# Patient Record
Sex: Male | Born: 1945
Health system: Southern US, Community
[De-identification: ages and names within clinical notes are randomized; demographics above are authoritative.]

## PROBLEM LIST (undated history)

## (undated) DIAGNOSIS — Z860100 Personal history of colon polyps, unspecified: Secondary | ICD-10-CM

## (undated) DIAGNOSIS — L989 Disorder of the skin and subcutaneous tissue, unspecified: Secondary | ICD-10-CM

## (undated) DIAGNOSIS — R0602 Shortness of breath: Secondary | ICD-10-CM

## (undated) DIAGNOSIS — N529 Male erectile dysfunction, unspecified: Secondary | ICD-10-CM

## (undated) DIAGNOSIS — L570 Actinic keratosis: Secondary | ICD-10-CM

## (undated) DIAGNOSIS — M109 Gout, unspecified: Secondary | ICD-10-CM

## (undated) HISTORY — DX: Personal history of colonic polyps: Z86.010

## (undated) HISTORY — DX: Male erectile dysfunction, unspecified: N52.9

## (undated) HISTORY — DX: Shortness of breath: R06.02

## (undated) HISTORY — PX: COLONOSCOPY: SHX174

## (undated) HISTORY — DX: Actinic keratosis: L57.0

## (undated) HISTORY — DX: Personal history of colon polyps, unspecified: Z86.0100

## (undated) HISTORY — DX: Disorder of the skin and subcutaneous tissue, unspecified: L98.9

## (undated) HISTORY — DX: Gout, unspecified: M10.9

---

## 2005-02-10 ENCOUNTER — Ambulatory Visit: Payer: Self-pay | Admitting: Family Medicine

## 2007-11-14 ENCOUNTER — Ambulatory Visit: Payer: Self-pay | Admitting: Gastroenterology

## 2007-11-14 LAB — HM COLONOSCOPY

## 2007-12-27 ENCOUNTER — Ambulatory Visit: Payer: Self-pay | Admitting: Family Medicine

## 2009-10-31 ENCOUNTER — Emergency Department: Payer: Self-pay | Admitting: Internal Medicine

## 2010-06-01 ENCOUNTER — Emergency Department: Payer: Self-pay | Admitting: Emergency Medicine

## 2010-06-02 ENCOUNTER — Ambulatory Visit: Payer: Self-pay

## 2011-11-24 LAB — HM HEPATITIS C SCREENING LAB: HM Hepatitis Screen: NEGATIVE

## 2012-03-27 ENCOUNTER — Ambulatory Visit: Payer: Self-pay

## 2014-11-26 LAB — CBC AND DIFFERENTIAL
HCT: 42 % (ref 41–53)
Hemoglobin: 14.5 g/dL (ref 13.5–17.5)
Platelets: 192 10*3/uL (ref 150–399)
WBC: 6.4 10^3/mL

## 2014-11-26 LAB — LIPID PANEL
Cholesterol: 152 mg/dL (ref 0–200)
HDL: 53 mg/dL (ref 35–70)
LDL Cholesterol: 85 mg/dL
Triglycerides: 71 mg/dL (ref 40–160)

## 2014-11-26 LAB — HEPATIC FUNCTION PANEL
ALT: 14 U/L (ref 10–40)
AST: 20 U/L (ref 14–40)

## 2014-11-26 LAB — TSH: TSH: 1.64 u[IU]/mL (ref 0.41–5.90)

## 2014-11-26 LAB — BASIC METABOLIC PANEL
BUN: 12 mg/dL (ref 4–21)
CREATININE: 0.8 mg/dL (ref 0.6–1.3)
GLUCOSE: 93 mg/dL
POTASSIUM: 4.5 mmol/L (ref 3.4–5.3)
SODIUM: 141 mmol/L (ref 137–147)

## 2015-06-10 ENCOUNTER — Encounter: Payer: Self-pay | Admitting: Family Medicine

## 2015-06-10 ENCOUNTER — Ambulatory Visit (INDEPENDENT_AMBULATORY_CARE_PROVIDER_SITE_OTHER): Payer: Medicare Other | Admitting: Family Medicine

## 2015-06-10 VITALS — BP 134/84 | HR 83 | Temp 97.8°F | Resp 16 | Wt 182.4 lb

## 2015-06-10 DIAGNOSIS — T148 Other injury of unspecified body region: Secondary | ICD-10-CM

## 2015-06-10 DIAGNOSIS — W57XXXA Bitten or stung by nonvenomous insect and other nonvenomous arthropods, initial encounter: Secondary | ICD-10-CM | POA: Diagnosis not present

## 2015-06-10 DIAGNOSIS — N529 Male erectile dysfunction, unspecified: Secondary | ICD-10-CM | POA: Insufficient documentation

## 2015-06-10 DIAGNOSIS — R609 Edema, unspecified: Secondary | ICD-10-CM | POA: Insufficient documentation

## 2015-06-10 DIAGNOSIS — Z6825 Body mass index (BMI) 25.0-25.9, adult: Secondary | ICD-10-CM | POA: Insufficient documentation

## 2015-06-10 MED ORDER — PREDNISONE 20 MG PO TABS: ORAL_TABLET | ORAL | Status: DC

## 2015-06-10 MED ORDER — FLUOCINONIDE 0.05 % EX CREA: 1.0000 "application " | TOPICAL_CREAM | Freq: Three times a day (TID) | CUTANEOUS | Status: DC

## 2015-06-10 NOTE — Patient Instructions (Addendum)
Consider use of DEET insect repellents like OFF

## 2015-06-10 NOTE — Progress Notes (Signed)
Subjective:     Patient ID: Michael Hicks, male   DOB: 30-Dec-1945, 69 y.o.   MRN: [id number]  HPI  Chief Complaint  Patient presents with  . Rash    patient comes in office with concerns of rash that began 2-3 weeks ago in his lower extremity spreading to his  upper extremity   Spend time outside in his yard/fishing and has noticed one of his dogs has fleas. Treated him for similar bites last year.   Review of Systems  Constitutional: Negative for fever and chills.       Objective:   Physical Exam  Constitutional: He appears well-developed and well-nourished. No distress.  Skin:  Multiple papules c/w insect bites on his bilateral lower extremities.       Assessment:    1. Insect bites - fluocinonide cream (LIDEX) 0.05 %; Apply 1 application topically 3 (three) times daily. To poison ivy rash  Dispense: 15 g; Refill: 0 - predniSONE (DELTASONE) 20 MG tablet; Taper as follows: 3 pills for 4 days, two pills for 4 days, one pill for four days  Dispense: 24 tablet; Refill: 0    Plan:    Encourage use of insect repellent.

## 2015-06-28 ENCOUNTER — Other Ambulatory Visit: Payer: Self-pay | Admitting: Family Medicine

## 2015-06-28 DIAGNOSIS — W57XXXA Bitten or stung by nonvenomous insect and other nonvenomous arthropods, initial encounter: Secondary | ICD-10-CM

## 2015-06-28 MED ORDER — FLUOCINONIDE 0.05 % EX CREA: 1.0000 "application " | TOPICAL_CREAM | Freq: Three times a day (TID) | CUTANEOUS | Status: DC

## 2015-06-28 NOTE — Telephone Encounter (Signed)
Refill request

## 2015-06-28 NOTE — Telephone Encounter (Signed)
Patient advised.

## 2015-06-28 NOTE — Telephone Encounter (Signed)
Pt called wanting to get a refill on the Fluocinonide Cream (lidex) 0.05% apply 3 times a day. [place]. CB# [phone]. CC

## 2015-06-28 NOTE — Telephone Encounter (Signed)
Refill sent in

## 2015-12-01 ENCOUNTER — Encounter: Payer: Self-pay | Admitting: Family Medicine

## 2015-12-01 ENCOUNTER — Ambulatory Visit (INDEPENDENT_AMBULATORY_CARE_PROVIDER_SITE_OTHER): Payer: Medicare Other | Admitting: Family Medicine

## 2015-12-01 VITALS — BP 138/82 | HR 84 | Temp 98.3°F | Resp 16 | Ht 71.0 in | Wt 181.0 lb

## 2015-12-01 DIAGNOSIS — Z1211 Encounter for screening for malignant neoplasm of colon: Secondary | ICD-10-CM

## 2015-12-01 DIAGNOSIS — R239 Unspecified skin changes: Secondary | ICD-10-CM

## 2015-12-01 DIAGNOSIS — Z0000 Encounter for general adult medical examination without abnormal findings: Secondary | ICD-10-CM | POA: Diagnosis not present

## 2015-12-01 DIAGNOSIS — R234 Changes in skin texture: Secondary | ICD-10-CM | POA: Diagnosis not present

## 2015-12-01 DIAGNOSIS — Z125 Encounter for screening for malignant neoplasm of prostate: Secondary | ICD-10-CM | POA: Diagnosis not present

## 2015-12-01 DIAGNOSIS — Z23 Encounter for immunization: Secondary | ICD-10-CM | POA: Diagnosis not present

## 2015-12-01 LAB — IFOBT (OCCULT BLOOD): IFOBT: POSITIVE

## 2015-12-01 NOTE — Progress Notes (Signed)
Patient: Michael Hicks, Male    DOB: [birth year], 69 y.o.   MRN: [id number] Visit Date: 12/01/2015  Today's Provider: Margarita Rana, MD   Chief Complaint  Patient presents with  . Medicare Wellness   Subjective:    Annual wellness visit Michael Hicks is a 69 y.o. male. He feels well. He reports exercising daily, walks 1 mile and lifts weights. He reports he is sleeping well.  Last AWV- 11/25/2014 (labs stable; lipids WNL) Last Colon- 11/14/2007- polyps. Dr. Allen Norris Last PSA- 05/09/2013- 1.1 Immunizations UTD, except flu  -----------------------------------------------------------   Review of Systems  Eyes: Positive for itching.  All other systems reviewed and are negative.   Social History   Social History  . Marital Status: Divorced    Spouse Name: N/A  . Number of Children: 1  . Years of Education: 14   Occupational History  .      RETIRED   Social History Main Topics  . Smoking status: Former Smoker    Quit date: 12/04/1990  . Smokeless tobacco: Never Used  . Alcohol Use: 6.0 oz/week    0 Standard drinks or equivalent, 10 Cans of beer per week     Comment: 1-2 beers daily  . Drug Use: No  . Sexual Activity: Not on file   Other Topics Concern  . Not on file   Social History Narrative    Past Medical History  Diagnosis Date  . Erectile dysfunction      Patient Active Problem List   Diagnosis Date Noted  . Body mass index (BMI) of 25.0-25.9 in adult 06/10/2015  . Accumulation of fluid in tissues 06/10/2015  . ED (erectile dysfunction) of organic origin 06/10/2015    No past surgical history on file.  His family history includes COPD in his mother; Heart disease in his mother; Hypertension in his father.    Previous Medications   No medications on file    Patient Care Team: Margarita Rana, MD as PCP - General (Family Medicine)     Objective:   Vitals: BP 138/82 mmHg  Pulse 84  Temp(Src) 98.3 F (36.8 C) (Oral)  Resp 16   Ht 5\' 11"  (1.803 m)  Wt 181 lb (82.101 kg)  BMI 25.26 kg/m2  Physical Exam  Constitutional: He is oriented to person, place, and time. He appears well-developed and well-nourished.  HENT:  Head: Normocephalic and atraumatic.  Right Ear: External ear normal.  Left Ear: External ear normal.  Nose: Nose normal.  Mouth/Throat: Oropharynx is clear and moist.  Eyes: Conjunctivae and EOM are normal. Pupils are equal, round, and reactive to light.  Neck: Normal range of motion. Neck supple.  Cardiovascular: Normal rate, regular rhythm and normal heart sounds.   Pulmonary/Chest: Effort normal and breath sounds normal. No respiratory distress.  Abdominal: Soft. Bowel sounds are normal.  Genitourinary: Guaiac positive stool.  Prostate slightly enlarged.   Musculoskeletal: Normal range of motion.  Neurological: He is alert and oriented to person, place, and time.  Skin:  Multiple dry, scaly lesions in sun exposed areas.   Psychiatric: He has a normal mood and affect. His behavior is normal. Judgment and thought content normal.    Activities of Daily Living In your present state of health, do you have any difficulty performing the following activities: 12/01/2015  Hearing? N  Vision? N  Difficulty concentrating or making decisions? N  Walking or climbing stairs? N  Dressing or bathing? N  Doing errands, shopping?  N    Fall Risk Assessment Fall Risk  12/01/2015  Falls in the past year? No     Depression Screen PHQ 2/9 Scores 12/01/2015  PHQ - 2 Score 0    Cognitive Testing - 6-CIT  Correct? Score   What year is it? yes 0 0 or 4  What month is it? yes 0 0 or 3  Memorize:    Michael Hicks,  44,  [place] [address],  [place],      What time is it? (within 1 hour) yes 0 0 or 3  Count backwards from 20 yes 0 0, 2, or 4  Name the months of the year yes 0 0, 2, or 4  Repeat name & address above yes 0 0, 2, 4, 6, 8, or 10       TOTAL SCORE  0/28   Interpretation:  Normal  Normal (0-7)  Abnormal (8-28)       Assessment & Plan:     Annual Wellness Visit  Reviewed patient's Family Medical History Reviewed and updated list of patient's medical providers Assessment of cognitive impairment was done Assessed patient's functional ability Established a written schedule for health screening [place] Completed and Reviewed  Exercise Activities and Dietary recommendations Goals    None      Immunization History  Administered Date(s) Administered  . Pneumococcal Conjugate-13 11/24/2013  . Pneumococcal Polysaccharide-23 11/25/2014  . Td 11/22/2012  . Tdap 11/22/2012  . Zoster 11/22/2012    Health Maintenance  Topic Date Due  . Hepatitis C Screening  16-Aug-1946  . INFLUENZA VACCINE  07/05/2015  . COLONOSCOPY  11/13/2017  . TETANUS/TDAP  11/22/2022  . ZOSTAVAX  Completed  . PNA vac Low Risk Adult  Completed   1. Medicare annual wellness visit, subsequent As above.   2. Flu vaccine need Given today.  - Flu Vaccine QUAD 36+ mos IM  3. Recent skin changes Refer to Dermatology.  - Ambulatory referral to Dermatology  4. Screening PSA (prostate specific antigen) Would like it rechecked.  - PSA  5. Colon cancer screening Positive today. Recheck in one week. Did have colon polyp and thinks he was supposed to follow up before 10 years. Will check into this also.   - IFOBT POC (occult bld, rslt in office)    Discussed health benefits of physical activity, and encouraged him to engage in regular exercise appropriate for his age and condition.  I have reviewed the document for accuracy and completeness and I agree with above. Jerrell Belfast, MD   Margarita Rana, MD    ------------------------------------------------------------------------------------------------------------

## 2015-12-02 LAB — PSA: Prostate Specific Ag, Serum: 1.8 ng/mL (ref 0.0–4.0)

## 2015-12-08 ENCOUNTER — Telehealth: Payer: Self-pay | Admitting: Emergency Medicine

## 2015-12-08 ENCOUNTER — Other Ambulatory Visit (INDEPENDENT_AMBULATORY_CARE_PROVIDER_SITE_OTHER): Payer: Medicare Other | Admitting: Emergency Medicine

## 2015-12-08 DIAGNOSIS — Z1211 Encounter for screening for malignant neoplasm of colon: Secondary | ICD-10-CM | POA: Diagnosis not present

## 2015-12-08 DIAGNOSIS — R195 Other fecal abnormalities: Secondary | ICD-10-CM

## 2015-12-08 LAB — IFOBT (OCCULT BLOOD): IFOBT: POSITIVE

## 2015-12-08 NOTE — Telephone Encounter (Signed)
Pt brought his OC lyte specimen by and the results were positive, I put it in the chart but since it was positive I wanted to make sure you saw the results. Thanks.

## 2015-12-08 NOTE — Telephone Encounter (Signed)
Referred to Dr. Allen Norris as urgent referral. LMTCB to inform Mr. Dacy. Renaldo Fiddler, CMA

## 2015-12-08 NOTE — Telephone Encounter (Signed)
Informed pt as below. Michael Hicks, CMA  

## 2015-12-08 NOTE — Telephone Encounter (Signed)
Please check chart and schedule referral STAT to GI that he has seen in past.  Thanks.

## 2015-12-10 ENCOUNTER — Telehealth: Payer: Self-pay | Admitting: Gastroenterology

## 2015-12-10 ENCOUNTER — Telehealth: Payer: Self-pay | Admitting: Family Medicine

## 2015-12-10 NOTE — Telephone Encounter (Signed)
Advised pt of lab results. Pt verbally acknowledges understanding. [name], CMA   

## 2015-12-10 NOTE — Telephone Encounter (Addendum)
Gastroenterology Pre-Procedure Review  Request Date: 12-30-2015 Requesting Physician: Dr.  PATIENT REVIEW QUESTIONS: The patient responded to the following health history questions as indicated:    1. Are you having any GI issues? no  Per patient a test came back with very little blood found, no problems and hasn't seen any 2. Do you have a personal history of Polyps? yes ( ) 3. Do you have a family history of Colon Cancer or Polyps? no 4. Diabetes Mellitus? no 5. Joint replacements in the past 12 months?no 6. Major health problems in the past 3 months?no 7. Any artificial heart valves, MVP, or defibrillator?no    MEDICATIONS & ALLERGIES:    Patient reports the following regarding taking any anticoagulation/antiplatelet therapy:   Plavix, Coumadin, Eliquis, Xarelto, Lovenox, Pradaxa, Brilinta, or Effient? no Aspirin? no  Patient confirms/reports the following medications:  No current outpatient prescriptions on file.   No current facility-administered medications for this visit.    Patient confirms/reports the following allergies:  No Known Allergies  No orders of the defined types were placed in this encounter.    AUTHORIZATION INFORMATION Primary Insurance: 1D#: Group #:  Secondary Insurance: 1D#: Group #:  SCHEDULE INFORMATION: Date:12-30-15  Time: Location:[facility]

## 2015-12-10 NOTE — Telephone Encounter (Signed)
Ok to call. PSA normal. Thanks.

## 2015-12-10 NOTE — Telephone Encounter (Signed)
Pt calling to request lab results.  CB#[phone]/MW

## 2015-12-14 ENCOUNTER — Other Ambulatory Visit: Payer: Self-pay

## 2015-12-14 NOTE — Telephone Encounter (Signed)
Patient left voice message for you that he is ready to set up colonoscopy. [phone]

## 2015-12-29 DIAGNOSIS — L570 Actinic keratosis: Secondary | ICD-10-CM | POA: Diagnosis not present

## 2015-12-29 DIAGNOSIS — Z85828 Personal history of other malignant neoplasm of skin: Secondary | ICD-10-CM | POA: Diagnosis not present

## 2015-12-29 DIAGNOSIS — L821 Other seborrheic keratosis: Secondary | ICD-10-CM | POA: Diagnosis not present

## 2015-12-29 DIAGNOSIS — D485 Neoplasm of uncertain behavior of skin: Secondary | ICD-10-CM | POA: Diagnosis not present

## 2015-12-29 DIAGNOSIS — L814 Other melanin hyperpigmentation: Secondary | ICD-10-CM | POA: Diagnosis not present

## 2016-01-07 ENCOUNTER — Encounter: Payer: Self-pay | Admitting: *Deleted

## 2016-01-11 NOTE — Discharge Instructions (Signed)

## 2016-01-14 ENCOUNTER — Other Ambulatory Visit: Payer: Self-pay | Admitting: Gastroenterology

## 2016-01-14 ENCOUNTER — Ambulatory Visit: Payer: Medicare Other | Admitting: Anesthesiology

## 2016-01-14 ENCOUNTER — Ambulatory Visit
Admission: RE | Admit: 2016-01-14 | Discharge: 2016-01-14 | Disposition: A | Discharge status: Home / Self Care | Payer: Medicare Other | Source: Ambulatory Visit | Attending: Gastroenterology | Admitting: Gastroenterology

## 2016-01-14 ENCOUNTER — Encounter
Admission: RE | Disposition: A | Discharge status: Home / Self Care | Payer: Self-pay | Source: Ambulatory Visit | Attending: Gastroenterology

## 2016-01-14 DIAGNOSIS — D125 Benign neoplasm of sigmoid colon: Secondary | ICD-10-CM | POA: Insufficient documentation

## 2016-01-14 DIAGNOSIS — Z825 Family history of asthma and other chronic lower respiratory diseases: Secondary | ICD-10-CM | POA: Diagnosis not present

## 2016-01-14 DIAGNOSIS — D122 Benign neoplasm of ascending colon: Secondary | ICD-10-CM | POA: Insufficient documentation

## 2016-01-14 DIAGNOSIS — Z860100 Personal history of colon polyps, unspecified: Secondary | ICD-10-CM | POA: Insufficient documentation

## 2016-01-14 DIAGNOSIS — D123 Benign neoplasm of transverse colon: Secondary | ICD-10-CM | POA: Diagnosis not present

## 2016-01-14 DIAGNOSIS — Z87891 Personal history of nicotine dependence: Secondary | ICD-10-CM | POA: Insufficient documentation

## 2016-01-14 DIAGNOSIS — Z1211 Encounter for screening for malignant neoplasm of colon: Secondary | ICD-10-CM | POA: Insufficient documentation

## 2016-01-14 DIAGNOSIS — Z8249 Family history of ischemic heart disease and other diseases of the circulatory system: Secondary | ICD-10-CM | POA: Diagnosis not present

## 2016-01-14 DIAGNOSIS — D124 Benign neoplasm of descending colon: Secondary | ICD-10-CM | POA: Insufficient documentation

## 2016-01-14 HISTORY — PX: POLYPECTOMY: SHX5525

## 2016-01-14 HISTORY — PX: COLONOSCOPY WITH PROPOFOL: SHX5780

## 2016-01-14 SURGERY — COLONOSCOPY WITH PROPOFOL
Anesthesia: Monitor Anesthesia Care

## 2016-01-14 MED ORDER — STERILE WATER FOR IRRIGATION IR SOLN
Status: DC | PRN
  Administered 2016-01-14: 09:00:00

## 2016-01-14 MED ORDER — PROPOFOL 10 MG/ML IV BOLUS
INTRAVENOUS | Status: DC | PRN
Start: 2016-01-14 — End: 2016-01-14
  Administered 2016-01-14 (×2): 50 mg via INTRAVENOUS
  Administered 2016-01-14: 100 mg via INTRAVENOUS
  Administered 2016-01-14: 50 mg via INTRAVENOUS
  Administered 2016-01-14: 30 mg via INTRAVENOUS
  Administered 2016-01-14 (×2): 50 mg via INTRAVENOUS
  Administered 2016-01-14: 20 mg via INTRAVENOUS

## 2016-01-14 MED ORDER — LACTATED RINGERS IV SOLN
INTRAVENOUS | Status: DC
  Administered 2016-01-14: 08:00:00 via INTRAVENOUS

## 2016-01-14 MED ORDER — LIDOCAINE HCL (CARDIAC) 20 MG/ML IV SOLN
INTRAVENOUS | Status: DC | PRN
  Administered 2016-01-14: 50 mg via INTRAVENOUS

## 2016-01-14 SURGICAL SUPPLY — 28 items
CANISTER SUCT 1200ML W/VALVE (MISCELLANEOUS) ×3 IMPLANT
FCP ESCP3.2XJMB 240X2.8X (MISCELLANEOUS)
FORCEPS BIOP RAD 4 LRG CAP 4 (CUTTING FORCEPS) IMPLANT
FORCEPS BIOP RJ4 240 W/NDL (MISCELLANEOUS)
FORCEPS ESCP3.2XJMB 240X2.8X (MISCELLANEOUS) IMPLANT
GOWN CVR UNV OPN BCK APRN NK (MISCELLANEOUS) ×4 IMPLANT
GOWN ISOL THUMB LOOP REG UNIV (MISCELLANEOUS) ×2
HEMOCLIP INSTINCT (CLIP) IMPLANT
INJECTOR VARIJECT VIN23 (MISCELLANEOUS) IMPLANT
KIT CO2 TUBING (TUBING) IMPLANT
KIT DEFENDO VALVE AND CONN (KITS) IMPLANT
KIT ENDO PROCEDURE OLY (KITS) ×3 IMPLANT
LIGATOR MULTIBAND 6SHOOTER MBL (MISCELLANEOUS) IMPLANT
MARKER SPOT ENDO TATTOO 5ML (MISCELLANEOUS) IMPLANT
PAD GROUND ADULT SPLIT (MISCELLANEOUS) IMPLANT
SNARE SHORT THROW 13M SML OVAL (MISCELLANEOUS) IMPLANT
SNARE SHORT THROW 30M LRG OVAL (MISCELLANEOUS) ×3 IMPLANT
SPOT EX ENDOSCOPIC TATTOO (MISCELLANEOUS)
SUCTION POLY TRAP 4CHAMBER (MISCELLANEOUS) IMPLANT
TRAP SUCTION POLY (MISCELLANEOUS) ×9 IMPLANT
TUBING CONN 6MMX3.1M (TUBING)
TUBING SUCTION CONN 0.25 STRL (TUBING) IMPLANT
UNDERPAD 30X60 958B10 (PK) (MISCELLANEOUS) IMPLANT
VALVE BIOPSY ENDO (VALVE) IMPLANT
VARIJECT INJECTOR VIN23 (MISCELLANEOUS)
WATER AUXILLARY (MISCELLANEOUS) IMPLANT
WATER STERILE IRR 250ML POUR (IV SOLUTION) ×3 IMPLANT
WATER STERILE IRR 500ML POUR (IV SOLUTION) IMPLANT

## 2016-01-14 NOTE — Anesthesia Postprocedure Evaluation (Signed)
Anesthesia Post Note  Patient: Michael Hicks  Procedure(s) Performed: Procedure(s) (LRB): COLONOSCOPY WITH PROPOFOL (N/A) POLYPECTOMY  Patient location during evaluation: PACU Anesthesia Type: MAC Level of consciousness: awake and alert and oriented Pain management: pain level controlled Vital Signs Assessment: post-procedure vital signs reviewed and stable Respiratory status: spontaneous breathing and nonlabored ventilation Cardiovascular status: stable Postop Assessment: no signs of nausea or vomiting and adequate PO intake Anesthetic complications: no    Estill Batten

## 2016-01-14 NOTE — Anesthesia Procedure Notes (Signed)
Procedure Name: MAC Performed by: Adreena Willits Pre-anesthesia Checklist: Patient identified, Emergency Drugs available, Suction available, Timeout performed and Patient being monitored Patient Re-evaluated:Patient Re-evaluated prior to inductionOxygen Delivery Method: Nasal cannula Placement Confirmation: positive ETCO2     

## 2016-01-14 NOTE — Anesthesia Preprocedure Evaluation (Signed)
Anesthesia Evaluation  Patient identified by MRN, date of birth, ID band  Reviewed: Allergy & Precautions, NPO status , Patient's Chart, lab work & pertinent test results, reviewed documented beta blocker date and time   Airway Mallampati: I  TM Distance: >3 FB Neck ROM: Full    Dental no notable dental hx.    Pulmonary former smoker,    Pulmonary exam normal        Cardiovascular negative cardio ROS Normal cardiovascular exam     Neuro/Psych negative neurological ROS  negative psych ROS   GI/Hepatic negative GI ROS, Neg liver ROS,   Endo/Other  negative endocrine ROS  Renal/GU negative Renal ROS     Musculoskeletal negative musculoskeletal ROS (+)   Abdominal   Peds negative pediatric ROS (+)  Hematology negative hematology ROS (+)   Anesthesia Other Findings   Reproductive/Obstetrics                             Anesthesia Physical Anesthesia Plan  ASA: II  Anesthesia Plan: MAC   Post-op Pain Management:    Induction: Intravenous  Airway Management Planned:   Additional Equipment:   Intra-op Plan:   Post-operative Plan:   Informed Consent: I have reviewed the patients History and Physical, chart, labs and discussed the procedure including the risks, benefits and alternatives for the proposed anesthesia with the patient or authorized representative who has indicated his/her understanding and acceptance.     Plan Discussed with: CRNA  Anesthesia Plan Comments:         Anesthesia Quick Evaluation

## 2016-01-14 NOTE — Op Note (Signed)
[facility]astroenterology Patient Name: Michael Hicks Procedure Date: 01/14/2016 8:47 AM MRN: [id number] Account #: [id number] Date of Birth: [birth year] Admit Type: Outpatient Age: 70 Room: [facility] OR ROOM 01 Gender: Male Note Status: Finalized Procedure:         Colonoscopy Indications:       High risk colon cancer surveillance: Personal history of                     colonic polyps Providers:         Lucilla Lame, MD Referring MD:      Jerrell Belfast, MD (Referring MD) Medicines:         Propofol per Anesthesia Complications:     No immediate complications. Procedure:         Pre-Anesthesia Assessment:                    - Prior to the procedure, a History and Physical was                     performed, and patient medications and allergies were                     reviewed. The patient's tolerance of previous anesthesia                     was also reviewed. The risks and benefits of the procedure                     and the sedation options and risks were discussed with the                     patient. All questions were answered, and informed consent                     was obtained. Prior Anticoagulants: The patient has taken                     no previous anticoagulant or antiplatelet agents. ASA                     Grade Assessment: II - A patient with mild systemic                     disease. After reviewing the risks and benefits, the                     patient was deemed in satisfactory condition to undergo                     the procedure.                    After obtaining informed consent, the colonoscope was                     passed under direct vision. Throughout the procedure, the                     patient's blood pressure, pulse, and oxygen saturations                     were monitored continuously. The Crawford  colonoscope (S#: [id number]) was introduced through the anus                     and advanced to  the the cecum, identified by appendiceal                     orifice and ileocecal valve. The colonoscopy was performed                     without difficulty. The patient tolerated the procedure                     well. The quality of the bowel preparation was excellent. Findings:      The perianal and digital rectal examinations were normal.      Multiple sessile polyps were found in the sigmoid colon, in the       descending colon, in the transverse colon, at the hepatic flexure and in       the ascending colon. The polyps were 5 to 9 mm in size. These polyps       were removed with a cold snare. Resection and retrieval were complete.      Many sessile polyps were found in the entire colon. The polyps were 3 to       5 mm in size. These polyps were removed with a cold biopsy forceps.       Resection and retrieval were complete. Impression:        - Multiple 5 to 9 mm polyps in the sigmoid colon, in the                     descending colon, in the transverse colon, at the hepatic                     flexure and in the ascending colon. Resected and retrieved.                    - Many 3 to 5 mm polyps in the entire colon. Resected and                     retrieved. Recommendation:    - Await pathology results.                    - Repeat colonoscopy in 1 year for surveillance. Procedure Code(s): --- Professional ---                    [phone], Colonoscopy, flexible; with removal of tumor(s),                     polyp(s), or other lesion(s) by snare technique                    45380, 20, Colonoscopy, flexible; with biopsy, single or                     multiple Diagnosis Code(s): --- Professional ---                    Z86.010, Personal history of colonic polyps                    D12.5, Benign neoplasm of sigmoid colon  D12.4, Benign neoplasm of descending colon                    D12.3, Benign neoplasm of transverse colon                    D12.2, Benign neoplasm of  ascending colon CPT copyright 2014 American Medical Association. All rights reserved. The codes documented in this report are preliminary and upon coder review may  be revised to meet current compliance requirements. Lucilla Lame, MD 01/14/2016 9:19:00 AM This report has been signed electronically. Number of Addenda: 0 Note Initiated On: 01/14/2016 8:47 AM Scope Withdrawal Time: 0 hours 12 minutes 47 seconds  Total Procedure Duration: 0 hours 15 minutes 46 seconds       [facility]

## 2016-01-14 NOTE — H&P (Signed)
  [facility]  [address] [place], [place] [address] Phone: [phone] Fax : [phone]  Primary Care Physician:  Margarita Rana, MD Primary Gastroenterologist:  Dr. Allen Norris  Pre-Procedure History & Physical: HPI:  Michael Hicks is a 70 y.o. male is here for an colonoscopy.   Past Medical History  Diagnosis Date  . Erectile dysfunction     Past Surgical History  Procedure Laterality Date  . Colonoscopy      Prior to Admission medications   Not on File    Allergies as of 12/14/2015  . (No Known Allergies)    Family History  Problem Relation Age of Onset  . COPD Mother   . Heart disease Mother   . Hypertension Father     Social History   Social History  . Marital Status: Divorced    Spouse Name: N/A  . Number of Children: 1  . Years of Education: 14   Occupational History  .      RETIRED   Social History Main Topics  . Smoking status: Former Smoker    Quit date: 12/04/1990  . Smokeless tobacco: Never Used  . Alcohol Use: 6.0 oz/week    10 Cans of beer, 0 Standard drinks or equivalent per week     Comment: 1-2 beers daily  . Drug Use: No  . Sexual Activity: Not on file   Other Topics Concern  . Not on file   Social History Narrative    Review of Systems: See HPI, otherwise negative ROS  Physical Exam: BP 156/83 mmHg  Pulse 84  Temp(Src) 98.1 F (36.7 C) (Temporal)  Resp 16  Ht 5\' 11"  (1.803 m)  Wt 172 lb (78.019 kg)  BMI 24.00 kg/m2  SpO2 100% General:   Alert,  pleasant and cooperative in NAD Head:  Normocephalic and atraumatic. Neck:  Supple; no masses or thyromegaly. Lungs:  Clear throughout to auscultation.    Heart:  Regular rate and rhythm. Abdomen:  Soft, nontender and nondistended. Normal bowel sounds, without guarding, and without rebound.   Neurologic:  Alert and  oriented x4;  grossly normal neurologically.  Impression/Plan: [name] is here for an colonoscopy to be performed for history  of colon polyps.  Risks, benefits, limitations, and alternatives regarding  colonoscopy have been reviewed with the patient.  Questions have been answered.  All parties agreeable.   Ollen Bowl, MD  01/14/2016, 8:21 AM

## 2016-01-14 NOTE — Transfer of Care (Signed)
Immediate Anesthesia Transfer of Care Note  Patient: Michael Hicks  Procedure(s) Performed: Procedure(s): COLONOSCOPY WITH PROPOFOL (N/A) POLYPECTOMY  Patient Location: PACU  Anesthesia Type: MAC  Level of Consciousness: awake, alert  and patient cooperative  Airway and Oxygen Therapy: Patient Spontanous Breathing and Patient connected to supplemental oxygen  Post-op Assessment: Post-op Vital signs reviewed, Patient's Cardiovascular Status Stable, Respiratory Function Stable, Patent Airway and No signs of Nausea or vomiting  Post-op Vital Signs: Reviewed and stable  Complications: No apparent anesthesia complications

## 2016-01-17 ENCOUNTER — Encounter: Payer: Self-pay | Admitting: Gastroenterology

## 2016-01-20 ENCOUNTER — Encounter: Payer: Self-pay | Admitting: Gastroenterology

## 2016-01-26 ENCOUNTER — Telehealth: Payer: Self-pay | Admitting: Gastroenterology

## 2016-01-26 NOTE — Telephone Encounter (Signed)
Patient called and has some questions regarding the letter you sent him about his colonoscopy. Is it cancerous?

## 2016-01-26 NOTE — Telephone Encounter (Signed)
Answered pt's questions about his colonoscopy results. Pt has been added to recall list for 1 year due to results and amount of polyps removed.

## 2016-06-15 ENCOUNTER — Telehealth: Payer: Self-pay | Admitting: Family Medicine

## 2016-06-15 DIAGNOSIS — N529 Male erectile dysfunction, unspecified: Secondary | ICD-10-CM

## 2016-06-15 MED ORDER — SILDENAFIL CITRATE 100 MG PO TABS: 100.0000 mg | ORAL_TABLET | Freq: Every day | ORAL | Status: DC | PRN

## 2016-06-15 NOTE — Telephone Encounter (Signed)
Pt wants refill of Viagra sent to [facility] on [address].  He is wanting a year supply like Dr. Venia Minks did prior to her leaving.

## 2016-06-15 NOTE — Telephone Encounter (Signed)
Rx sent 

## 2016-08-10 ENCOUNTER — Telehealth: Payer: Self-pay | Admitting: Physician Assistant

## 2016-08-10 DIAGNOSIS — N529 Male erectile dysfunction, unspecified: Secondary | ICD-10-CM

## 2016-08-10 MED ORDER — SILDENAFIL CITRATE 20 MG PO TABS: ORAL_TABLET | ORAL | 4 refills | Status: DC

## 2016-08-10 NOTE — Telephone Encounter (Signed)
New Rx sent to [facility]

## 2016-08-10 NOTE — Telephone Encounter (Signed)
Patient advised.

## 2016-08-10 NOTE — Telephone Encounter (Signed)
Pt stated that his insurance doesn't cover the Viagra 100 mg and he can't afford the out of pocket cost. Pt would like a RX for sildenafil 20 mg sent to [facility] in [place]. Pt also, advised that he was taking 50 mg of Viagra and it worked well and was asking if he could take 2 and 1/2 of the 20 mg Sildenafil to equal 50 mg. Getting the medication at [facility] will cost 80$ for 50 pills. Pt request a call back to advise if this is ok. Please advise. Thanks TNP

## 2016-08-31 ENCOUNTER — Encounter: Payer: Self-pay | Admitting: Family Medicine

## 2016-08-31 ENCOUNTER — Ambulatory Visit (INDEPENDENT_AMBULATORY_CARE_PROVIDER_SITE_OTHER): Payer: Medicare Other | Admitting: Family Medicine

## 2016-08-31 VITALS — BP 116/68 | HR 71 | Temp 98.2°F | Resp 17 | Wt 174.6 lb

## 2016-08-31 DIAGNOSIS — W57XXXA Bitten or stung by nonvenomous insect and other nonvenomous arthropods, initial encounter: Secondary | ICD-10-CM

## 2016-08-31 DIAGNOSIS — J069 Acute upper respiratory infection, unspecified: Secondary | ICD-10-CM

## 2016-08-31 DIAGNOSIS — T148 Other injury of unspecified body region: Secondary | ICD-10-CM | POA: Diagnosis not present

## 2016-08-31 NOTE — Patient Instructions (Signed)
Discussed use of Mucinex D for congestion, Delsym for cough, and Benadryl for postnasal drainage. Resume steroid cream for your insect bites.

## 2016-08-31 NOTE — Progress Notes (Signed)
Subjective:     Patient ID: Michael Hicks, male   DOB: September 29, 1946, 70 y.o.   MRN: [id number]  HPI  Chief Complaint  Patient presents with  . URI    Patient comes in office today with concerns of cold like symptoms intermittent over the past 4 weeks. Patient states that he has head/chest congestion, sore throat, chills, and barky cough.   . Rash    Patient would like to discuss medication for poison ivy, he states that it has not cleared up since last office visit.   States he had period of feeling better since the first of the month with cold sx recurring on 9/25. Previously treated for insect bites on his legs. He spends a lot of time outside. Does not use insect repellant.   Review of Systems     Objective:   Physical Exam  Constitutional: He appears well-developed and well-nourished. No distress.  Skin:  A few papules without vesicles or crusting seen on his right lower leg c/w insect bites  Ears: T.M's intact without inflammation Throat: no tonsillar enlargement or exudate Neck: no cervical adenopathy Lungs: clear     Assessment:    1. Upper respiratory infection  2. Insect bites    Plan:    Discussed use of Mucinex D for congestion, Delsym for cough, and Benadryl for postnasal drainage Resume steroid cream for his insect bites. Encouraged use of insect repellent.

## 2016-10-20 ENCOUNTER — Ambulatory Visit (INDEPENDENT_AMBULATORY_CARE_PROVIDER_SITE_OTHER): Payer: Medicare Other

## 2016-10-20 DIAGNOSIS — Z23 Encounter for immunization: Secondary | ICD-10-CM

## 2016-11-09 ENCOUNTER — Other Ambulatory Visit: Payer: Self-pay

## 2016-11-09 DIAGNOSIS — Z1329 Encounter for screening for other suspected endocrine disorder: Secondary | ICD-10-CM

## 2016-11-09 DIAGNOSIS — Z0000 Encounter for general adult medical examination without abnormal findings: Secondary | ICD-10-CM

## 2016-11-09 DIAGNOSIS — Z125 Encounter for screening for malignant neoplasm of prostate: Secondary | ICD-10-CM

## 2016-11-13 ENCOUNTER — Encounter: Payer: Self-pay | Admitting: *Deleted

## 2016-12-01 ENCOUNTER — Ambulatory Visit: Payer: Medicare Other

## 2016-12-01 ENCOUNTER — Ambulatory Visit: Payer: Medicare Other | Admitting: Family Medicine

## 2016-12-13 ENCOUNTER — Ambulatory Visit (INDEPENDENT_AMBULATORY_CARE_PROVIDER_SITE_OTHER): Payer: Medicare HMO | Admitting: Family Medicine

## 2016-12-13 ENCOUNTER — Ambulatory Visit (INDEPENDENT_AMBULATORY_CARE_PROVIDER_SITE_OTHER): Payer: Medicare HMO

## 2016-12-13 ENCOUNTER — Encounter: Payer: Self-pay | Admitting: Family Medicine

## 2016-12-13 VITALS — BP 158/82 | HR 68 | Temp 98.4°F | Ht 71.0 in | Wt 180.6 lb

## 2016-12-13 DIAGNOSIS — Z0000 Encounter for general adult medical examination without abnormal findings: Secondary | ICD-10-CM

## 2016-12-13 DIAGNOSIS — Z125 Encounter for screening for malignant neoplasm of prostate: Secondary | ICD-10-CM

## 2016-12-13 DIAGNOSIS — D122 Benign neoplasm of ascending colon: Secondary | ICD-10-CM

## 2016-12-13 NOTE — Patient Instructions (Signed)

## 2016-12-13 NOTE — Progress Notes (Signed)
.            HPI Review of Systems  Constitutional: Negative for appetite change, chills and fever.  Respiratory: Negative for chest tightness, shortness of breath and wheezing.   Cardiovascular: Negative for chest pain and palpitations.  Gastrointestinal: Negative for abdominal pain, nausea and vomiting.   Physical Exam

## 2016-12-13 NOTE — Progress Notes (Signed)
Patient: Michael Hicks, Male    DOB: [birth year], 71 y.o.   MRN: [id number] Visit Date: 12/13/2016  Today's Provider: Lelon Huh, MD   Chief Complaint  Patient presents with  . Follow-up  . Erectile Dysfunction   Subjective:    Annual wellness visit Michael Hicks is a 71 y.o. male. He feels fairly well. He reports exercising. He reports he is sleeping well.  ----------------------------------------------------------- Follow up ED:  Current treatment includes Sildenafil. Patient reports good compliance with treatment, good tolerance and good symptom control.   Review of Systems  Constitutional: Negative for chills, diaphoresis and fever.  HENT: Negative for congestion, ear discharge, ear pain, hearing loss, nosebleeds, sore throat and tinnitus.   Eyes: Negative for photophobia, pain, discharge and redness.  Respiratory: Negative for cough, shortness of breath, wheezing and stridor.   Cardiovascular: Negative for chest pain, palpitations and leg swelling.  Gastrointestinal: Negative for abdominal pain, blood in stool, constipation, diarrhea, nausea and vomiting.  Endocrine: Negative for polydipsia.  Genitourinary: Negative for dysuria, flank pain, frequency, hematuria and urgency.  Musculoskeletal: Negative for back pain, myalgias and neck pain.  Skin: Negative for rash.  Allergic/Immunologic: Negative for environmental allergies.  Neurological: Negative for dizziness, tremors, seizures, weakness and headaches.  Hematological: Does not bruise/bleed easily.  Psychiatric/Behavioral: Negative for hallucinations and suicidal ideas. The patient is not nervous/anxious.     Social History   Social History  . Marital status: Divorced    Spouse name: N/A  . Number of children: 1  . Years of education: 74   Occupational History  . Finance     RETIRED   Social History Main Topics  . Smoking status: Former Smoker    Packs/day: 1.00    Years: 15.00    Types:  Cigarettes    Quit date: 12/04/1990  . Smokeless tobacco: Never Used  . Alcohol use 0.6 - 1.2 oz/week    1 - 2 Cans of beer per week  . Drug use: No  . Sexual activity: Yes   Other Topics Concern  . Not on file   Social History Narrative   [place]    Past Medical History:  Diagnosis Date  . Erectile dysfunction   . Hx of colonic polyps      Patient Active Problem List   Diagnosis Date Noted  . Personal history of colonic polyps   . Benign neoplasm of sigmoid colon   . Benign neoplasm of descending colon   . Benign neoplasm of transverse colon   . Benign neoplasm of ascending colon   . Body mass index (BMI) of 25.0-25.9 in adult 06/10/2015  . Accumulation of fluid in tissues 06/10/2015  . ED (erectile dysfunction) of organic origin 06/10/2015    Past Surgical History:  Procedure Laterality Date  . COLONOSCOPY    . COLONOSCOPY WITH PROPOFOL N/A 01/14/2016   Procedure: COLONOSCOPY WITH PROPOFOL;  Surgeon: Lucilla Lame, MD;  Location: [facility];  Service: Endoscopy;  Laterality: N/A;  . POLYPECTOMY  01/14/2016   Procedure: POLYPECTOMY;  Surgeon: Lucilla Lame, MD;  Location: [facility];  Service: Endoscopy;;    His family history includes COPD in his mother; Heart disease in his mother; Hypertension in his father.      Current Outpatient Prescriptions:  .  sildenafil (REVATIO) 20 MG tablet, Take 2 and a half tabs PO 30 minutes before sexual intercourse, Disp: 50 tablet, Rfl: 4  Patient Care Team: Birdie Sons,  MD as PCP - General (Family Medicine) Idelle Leech, OD as Consulting Physician (Optometry)     Objective:   Vitals:  Vital Signs - Last Recorded  Most recent update: 12/13/2016 1:19 PM by Fabio Neighbors, LPN  BP    [id number] (BP Location: Right Arm)     Pulse  68     Temp  98.4 F (36.9 C) (Oral)     Ht  5\' 11"  (1.803 m)     Wt  180 lb 9.6 oz (81.9 kg)      BMI  25.19 kg/m       Physical Exam   General  Appearance:    Alert, cooperative, no distress, appears stated age  Head:    Normocephalic, without obvious abnormality, atraumatic  Eyes:    PERRL, conjunctiva/corneas clear, EOM's intact, fundi    benign, both eyes       Ears:    Normal TM's and external ear canals, both ears  Nose:   Nares normal, septum midline, mucosa normal, no drainage   or sinus tenderness  Throat:   Lips, mucosa, and tongue normal; teeth and gums normal  Neck:   Supple, symmetrical, trachea midline, no adenopathy;       thyroid:  No enlargement/tenderness/nodules; no carotid   bruit or JVD  Back:     Symmetric, no curvature, ROM normal, no CVA tenderness  Lungs:     Clear to auscultation bilaterally, respirations unlabored  Chest wall:    No tenderness or deformity  Heart:    Regular rate and rhythm, S1 and S2 normal, no murmur, rub   or gallop  Abdomen:     Soft, non-tender, bowel sounds active all four quadrants,    no masses, no organomegaly  Genitalia:    deferred  Rectal:    deferred  Extremities:   Extremities normal, atraumatic, no cyanosis or edema  Pulses:   2+ and symmetric all extremities  Skin:   Skin color, texture, turgor normal, no rashes or lesions  Lymph nodes:   Cervical, supraclavicular, and axillary nodes normal  Neurologic:   CNII-XII intact. Normal strength, sensation and reflexes      throughout    Activities of Daily Living In your present state of health, do you have any difficulty performing the following activities: 12/13/2016  Hearing? N  Vision? N  Difficulty concentrating or making decisions? N  Walking or climbing stairs? N  Dressing or bathing? N  Doing errands, shopping? N  Preparing Food and eating ? N  Using the Toilet? N  In the past six months, have you accidently leaked urine? N  Do you have problems with loss of bowel control? N  Managing your Medications? N  Managing your Finances? N  Housekeeping or managing your Housekeeping? N  Some recent data might be  hidden    Fall Risk Assessment Fall Risk  12/13/2016 12/01/2015  Falls in the past year? No No     Depression Screen PHQ 2/9 Scores 12/13/2016 12/01/2015  PHQ - 2 Score 0 0     Assessment & Plan:     Annual Physical  Reviewed patient's Family Medical History Reviewed and updated list of patient's medical providers Assessment of cognitive impairment was done Assessed patient's functional ability Established a written schedule for health screening [place] Completed and Reviewed  Exercise Activities and Dietary recommendations Goals    . Exercise 3x per week (30 min per time)  Starting 1/10/018, I will start back exercising 3 times a week for at least 30 minutes.        Immunization History  Administered Date(s) Administered  . Influenza, High Dose Seasonal PF 10/20/2016  . Influenza,inj,Quad PF,36+ Mos 12/01/2015  . Pneumococcal Conjugate-13 11/24/2013  . Pneumococcal Polysaccharide-23 11/25/2014  . Td 11/22/2012  . Tdap 11/22/2012  . Zoster 11/22/2012    Health Maintenance  Topic Date Due  . COLONOSCOPY  01/13/2017  . TETANUS/TDAP  11/22/2022  . INFLUENZA VACCINE  Completed  . ZOSTAVAX  Completed  . Hepatitis C Screening  Completed  . PNA vac Low Risk Adult  Completed     Discussed health benefits of physical activity, and encouraged him to engage in regular exercise appropriate for his age and condition.    ------------------------------------------------------------------------------------------------------------  1. Annual physical exam  - Lipid panel - Comprehensive metabolic panel  2. Prostate cancer screening  - PSA  3. Benign neoplasm of ascending colon Is due for colonoscopy in February . Is waiting to here from Dr. Allen Norris. To schedule   Lelon Huh, MD  [facility]

## 2016-12-13 NOTE — Progress Notes (Signed)
Subjective:   Michael Hicks is a 71 y.o. male who presents for Medicare Annual/Subsequent preventive examination.  Review of Systems:  N/A  Cardiac Risk Factors include: advanced age (>53men, >82 women);male gender     Objective:    Vitals: BP (!) 158/82 (BP Location: Right Arm)   Pulse 68   Temp 98.4 F (36.9 C) (Oral)   Ht 5\' 11"  (1.803 m)   Wt 180 lb 9.6 oz (81.9 kg)   BMI 25.19 kg/m   Body mass index is 25.19 kg/m.  Tobacco History  Smoking Status  . Former Smoker  . Packs/day: 1.00  . Years: 15.00  . Types: Cigarettes  . Quit date: 12/04/1990  Smokeless Tobacco  . Never Used     Counseling given: Not Answered   Past Medical History:  Diagnosis Date  . Erectile dysfunction   . Hx of colonic polyps    Past Surgical History:  Procedure Laterality Date  . COLONOSCOPY    . COLONOSCOPY WITH PROPOFOL N/A 01/14/2016   Procedure: COLONOSCOPY WITH PROPOFOL;  Surgeon: Lucilla Lame, MD;  Location: [facility];  Service: Endoscopy;  Laterality: N/A;  . POLYPECTOMY  01/14/2016   Procedure: POLYPECTOMY;  Surgeon: Lucilla Lame, MD;  Location: [facility];  Service: Endoscopy;;   Family History  Problem Relation Age of Onset  . COPD Mother   . Heart disease Mother   . Hypertension Father    History  Sexual Activity  . Sexual activity: Yes    Outpatient Encounter Prescriptions as of 12/13/2016  Medication Sig  . sildenafil (REVATIO) 20 MG tablet Take 2 and a half tabs PO 30 minutes before sexual intercourse   No facility-administered encounter medications on file as of 12/13/2016.     Activities of Daily Living In your present state of health, do you have any difficulty performing the following activities: 12/13/2016  Hearing? N  Vision? N  Difficulty concentrating or making decisions? N  Walking or climbing stairs? N  Dressing or bathing? N  Doing errands, shopping? N  Preparing Food and eating ? N  Using the Toilet? N  In the past six  months, have you accidently leaked urine? N  Do you have problems with loss of bowel control? N  Managing your Medications? N  Managing your Finances? N  Housekeeping or managing your Housekeeping? N  Some recent data might be hidden    Patient Care Team: Birdie Sons, MD as PCP - General (Family Medicine) Idelle Leech, OD as Consulting Physician (Optometry)   Assessment:     Exercise Activities and Dietary recommendations Current Exercise Habits: Structured exercise class, Type of exercise: walking, Time (Minutes): 30, Frequency (Times/Week): 1, Weekly Exercise (Minutes/Week): 30, Intensity: Mild  Goals    . Exercise 3x per week (30 min per time)          Starting 1/10/018, I will start back exercising 3 times a week for at least 30 minutes.       Fall Risk Fall Risk  12/13/2016 12/01/2015  Falls in the past year? No No   Depression Screen PHQ 2/9 Scores 12/13/2016 12/01/2015  PHQ - 2 Score 0 0    Cognitive Function        Immunization History  Administered Date(s) Administered  . Influenza, High Dose Seasonal PF 10/20/2016  . Influenza,inj,Quad PF,36+ Mos 12/01/2015  . Pneumococcal Conjugate-13 11/24/2013  . Pneumococcal Polysaccharide-23 11/25/2014  . Td 11/22/2012  . Tdap 11/22/2012  . Zoster 11/22/2012  Screening Tests Health Maintenance  Topic Date Due  . COLONOSCOPY  01/13/2017  . TETANUS/TDAP  11/22/2022  . INFLUENZA VACCINE  Completed  . ZOSTAVAX  Completed  . Hepatitis C Screening  Completed  . PNA vac Low Risk Adult  Completed      Plan:  I have personally reviewed and addressed the Medicare Annual Wellness questionnaire and have noted the following in the patient's chart:  A. Medical and social history B. Use of alcohol, tobacco or illicit drugs  C. Current medications and supplements D. Functional ability and status E.  Nutritional status F.  Physical activity G. Advance directives H. List of other physicians I.  Hospitalizations,  surgeries, and ER visits in previous 12 months J.  [place] such as hearing and vision if needed, cognitive and depression L. Referrals and appointments - none  In addition, I have reviewed and discussed with patient certain preventive protocols, quality metrics, and best practice recommendations. A written personalized care plan for preventive services as well as general preventive health recommendations were provided to patient.  See attached scanned questionnaire for additional information.   Signed,  Fabio Neighbors, LPN Nurse Health Advisor   MD Recommendations: none  I have reviewed the health advisor's note, was available for consultation, and agree with documentation and plan  Lelon Huh, MD

## 2016-12-14 LAB — COMPREHENSIVE METABOLIC PANEL
ALT: 14 IU/L (ref 0–44)
AST: 24 IU/L (ref 0–40)
Albumin/Globulin Ratio: 1.7 (ref 1.2–2.2)
Albumin: 4.3 g/dL (ref 3.6–4.8)
Alkaline Phosphatase: 87 IU/L (ref 39–117)
BUN/Creatinine Ratio: 17 (ref 10–24)
BUN: 16 mg/dL (ref 8–27)
Bilirubin Total: 0.7 mg/dL (ref 0.0–1.2)
CO2: 24 mmol/L (ref 18–29)
Calcium: 9.1 mg/dL (ref 8.6–10.2)
Chloride: 98 mmol/L (ref 96–106)
Creatinine, Ser: 0.92 mg/dL (ref 0.76–1.27)
GFR calc Af Amer: 98 mL/min/{1.73_m2} (ref >59)
GFR calc non Af Amer: 85 mL/min/{1.73_m2} (ref >59)
Globulin, Total: 2.6 g/dL (ref 1.5–4.5)
Glucose: 96 mg/dL (ref 65–99)
POTASSIUM: 4.3 mmol/L (ref 3.5–5.2)
Sodium: 139 mmol/L (ref 134–144)
Total Protein: 6.9 g/dL (ref 6.0–8.5)

## 2016-12-14 LAB — LIPID PANEL
CHOL/HDL RATIO: 3.1 ratio (ref 0.0–5.0)
Cholesterol, Total: 196 mg/dL (ref 100–199)
HDL: 64 mg/dL (ref >39)
LDL CALC: 109 mg/dL — AB (ref 0–99)
TRIGLYCERIDES: 113 mg/dL (ref 0–149)
VLDL CHOLESTEROL CAL: 23 mg/dL (ref 5–40)

## 2016-12-14 LAB — PSA: PROSTATE SPECIFIC AG, SERUM: 1.3 ng/mL (ref 0.0–4.0)

## 2017-02-01 ENCOUNTER — Telehealth: Payer: Self-pay | Admitting: Family Medicine

## 2017-02-01 DIAGNOSIS — Z86010 Personal history of colonic polyps: Secondary | ICD-10-CM

## 2017-02-01 NOTE — Telephone Encounter (Signed)
-----   Message from Birdie Sons, MD sent at 01/14/2017 11:30 AM EST ----- Regarding: FW: make sure he gets letter from dr Allen Norris about colonoscopy   ----- Message ----- From: Birdie Sons, MD Sent: 01/13/2017 To: Birdie Sons, MD Subject: FW: make sure he gets letter from dr Allen Norris ab#  Due for 1 year follow up colonoscopy on 01/13/17  ----- Message ----- From: Birdie Sons, MD Sent: 01/04/2017 To: Birdie Sons, MD Subject: make sure he gets letter from dr Allen Norris about #

## 2017-02-01 NOTE — Telephone Encounter (Signed)
Please check with patient to see if he has heard form [name] office about follow up colonoscopy, if not then needs referral since he due. Thanks.

## 2017-02-05 NOTE — Telephone Encounter (Signed)
Pt is returning call.  CB#[phone]/MW

## 2017-02-05 NOTE — Telephone Encounter (Signed)
LMTCB

## 2017-02-05 NOTE — Telephone Encounter (Signed)
He had not heard from Dr. Dorothey Baseman office, he will need referral. Go ahead and order? Thanks

## 2017-06-29 ENCOUNTER — Telehealth: Payer: Self-pay

## 2017-06-29 ENCOUNTER — Other Ambulatory Visit: Payer: Self-pay

## 2017-06-29 DIAGNOSIS — Z86010 Personal history of colonic polyps: Secondary | ICD-10-CM

## 2017-06-29 NOTE — Telephone Encounter (Signed)
Pt has requested his colonoscopy date to be changed from 07/24/17 to 07/26/17. Jackelyn Poling has been informed of date change.

## 2017-06-29 NOTE — Telephone Encounter (Signed)
Gastroenterology Pre-Procedure Review  Request Date: 07/23/17 Requesting Physician: Dr. Allen Norris  PATIENT REVIEW QUESTIONS: The patient responded to the following health history questions as indicated:    1. Are you having any GI issues? no 2. Do you have a personal history of Polyps? yes (last year adenomatous polyps) 3. Do you have a family history of Colon Cancer or Polyps? no 4. Diabetes Mellitus? no 5. Joint replacements in the past 12 months?no 6. Major health problems in the past 3 months?no 7. Any artificial heart valves, MVP, or defibrillator?no    MEDICATIONS & ALLERGIES:    Patient reports the following regarding taking any anticoagulation/antiplatelet therapy:   Plavix, Coumadin, Eliquis, Xarelto, Lovenox, Pradaxa, Brilinta, or Effient? no Aspirin? no  Patient confirms/reports the following medications:  Current Outpatient Prescriptions  Medication Sig Dispense Refill  . sildenafil (REVATIO) 20 MG tablet Take 2 and a half tabs PO 30 minutes before sexual intercourse 50 tablet 4   No current facility-administered medications for this visit.     Patient confirms/reports the following allergies:  No Known Allergies  No orders of the defined types were placed in this encounter.   AUTHORIZATION INFORMATION Primary Insurance: 1D#: Group #:  Secondary Insurance: 1D#: Group #:  SCHEDULE INFORMATION: Date: 07/23/17 Time: Location:[facility]

## 2017-07-18 ENCOUNTER — Other Ambulatory Visit: Payer: Self-pay

## 2017-07-18 MED ORDER — PEG 3350-KCL-NA BICARB-NACL 420 G PO SOLR: ORAL | 0 refills | Status: DC

## 2017-07-25 NOTE — Discharge Instructions (Signed)
General Anesthesia, Adult, Care After °These instructions provide you with information about caring for yourself after your procedure. Your health care provider may also give you more specific instructions. Your treatment has been planned according to current medical practices, but problems sometimes occur. Call your health care provider if you have any problems or questions after your procedure. °What can I expect after the procedure? °After the procedure, it is common to have: °· Vomiting. °· A sore throat. °· Mental slowness. ° °It is common to feel: °· Nauseous. °· Cold or shivery. °· Sleepy. °· Tired. °· Sore or achy, even in parts of your body where you did not have surgery. ° °Follow these instructions at home: °For at least 24 hours after the procedure: °· Do not: °? Participate in activities where you could fall or become injured. °? Drive. °? Use heavy machinery. °? Drink alcohol. °? Take sleeping pills or medicines that cause drowsiness. °? Make important decisions or sign legal documents. °? Take care of children on your own. °· Rest. °Eating and drinking °· If you vomit, drink water, juice, or soup when you can drink without vomiting. °· Drink enough fluid to keep your urine clear or pale yellow. °· Make sure you have little or no nausea before eating solid foods. °· Follow the diet recommended by your health care provider. °General instructions °· Have a responsible adult stay with you until you are awake and alert. °· Return to your normal activities as told by your health care provider. Ask your health care provider what activities are safe for you. °· Take over-the-counter and prescription medicines only as told by your health care provider. °· If you smoke, do not smoke without supervision. °· Keep all follow-up visits as told by your health care provider. This is important. °Contact a health care provider if: °· You continue to have nausea or vomiting at home, and medicines are not helpful. °· You  cannot drink fluids or start eating again. °· You cannot urinate after 8-12 hours. °· You develop a skin rash. °· You have fever. °· You have increasing redness at the site of your procedure. °Get help right away if: °· You have difficulty breathing. °· You have chest pain. °· You have unexpected bleeding. °· You feel that you are having a life-threatening or urgent problem. °This information is not intended to replace advice given to you by your health care provider. Make sure you discuss any questions you have with your health care provider. °Document Released: 02/26/2001 Document Revised: 04/24/2016 Document Reviewed: 11/04/2015 °Elsevier Interactive Patient Education © 2018 [employer] ° °

## 2017-07-26 ENCOUNTER — Ambulatory Visit: Payer: Medicare HMO | Admitting: Anesthesiology

## 2017-07-26 ENCOUNTER — Ambulatory Visit
Admission: RE | Admit: 2017-07-26 | Discharge: 2017-07-26 | Disposition: A | Discharge status: Home / Self Care | Payer: Medicare HMO | Source: Ambulatory Visit | Attending: Gastroenterology | Admitting: Gastroenterology

## 2017-07-26 ENCOUNTER — Encounter
Admission: RE | Disposition: A | Discharge status: Home / Self Care | Payer: Self-pay | Source: Ambulatory Visit | Attending: Gastroenterology

## 2017-07-26 DIAGNOSIS — D123 Benign neoplasm of transverse colon: Secondary | ICD-10-CM | POA: Diagnosis not present

## 2017-07-26 DIAGNOSIS — Z86010 Personal history of colonic polyps: Secondary | ICD-10-CM | POA: Insufficient documentation

## 2017-07-26 DIAGNOSIS — D125 Benign neoplasm of sigmoid colon: Secondary | ICD-10-CM

## 2017-07-26 DIAGNOSIS — K635 Polyp of colon: Secondary | ICD-10-CM

## 2017-07-26 DIAGNOSIS — Z1211 Encounter for screening for malignant neoplasm of colon: Secondary | ICD-10-CM | POA: Diagnosis not present

## 2017-07-26 DIAGNOSIS — D120 Benign neoplasm of cecum: Secondary | ICD-10-CM | POA: Diagnosis not present

## 2017-07-26 DIAGNOSIS — D124 Benign neoplasm of descending colon: Secondary | ICD-10-CM | POA: Diagnosis not present

## 2017-07-26 DIAGNOSIS — Z87891 Personal history of nicotine dependence: Secondary | ICD-10-CM | POA: Insufficient documentation

## 2017-07-26 HISTORY — PX: COLONOSCOPY WITH PROPOFOL: SHX5780

## 2017-07-26 SURGERY — COLONOSCOPY WITH PROPOFOL
Anesthesia: General | Wound class: Clean Contaminated

## 2017-07-26 MED ORDER — MEPERIDINE HCL 25 MG/ML IJ SOLN: 6.2500 mg | INTRAMUSCULAR | Status: DC | PRN

## 2017-07-26 MED ORDER — PROPOFOL 10 MG/ML IV BOLUS
INTRAVENOUS | Status: DC | PRN
  Administered 2017-07-26: 30 mg via INTRAVENOUS
  Administered 2017-07-26: 100 mg via INTRAVENOUS
  Administered 2017-07-26: 20 mg via INTRAVENOUS
  Administered 2017-07-26: 30 mg via INTRAVENOUS
  Administered 2017-07-26: 20 mg via INTRAVENOUS

## 2017-07-26 MED ORDER — LACTATED RINGERS IV SOLN
10.0000 mL/h | INTRAVENOUS | Status: DC
  Administered 2017-07-26: 10 mL/h via INTRAVENOUS

## 2017-07-26 MED ORDER — PROMETHAZINE HCL 25 MG/ML IJ SOLN: 6.2500 mg | INTRAMUSCULAR | Status: DC | PRN

## 2017-07-26 MED ORDER — OXYCODONE HCL 5 MG/5ML PO SOLN: 5.0000 mg | Freq: Once | ORAL | Status: DC | PRN

## 2017-07-26 MED ORDER — FENTANYL CITRATE (PF) 100 MCG/2ML IJ SOLN: 25.0000 ug | INTRAMUSCULAR | Status: DC | PRN

## 2017-07-26 MED ORDER — OXYCODONE HCL 5 MG PO TABS: 5.0000 mg | ORAL_TABLET | Freq: Once | ORAL | Status: DC | PRN

## 2017-07-26 MED ORDER — STERILE WATER FOR IRRIGATION IR SOLN
Status: DC | PRN
  Administered 2017-07-26: 100 mL

## 2017-07-26 MED ORDER — LIDOCAINE HCL (CARDIAC) 20 MG/ML IV SOLN
INTRAVENOUS | Status: DC | PRN
  Administered 2017-07-26: 50 mg via INTRAVENOUS

## 2017-07-26 SURGICAL SUPPLY — 23 items
CANISTER SUCT 1200ML W/VALVE (MISCELLANEOUS) ×3 IMPLANT
CLIP HMST 235XBRD CATH ROT (MISCELLANEOUS) IMPLANT
CLIP RESOLUTION 360 11X235 (MISCELLANEOUS)
FCP ESCP3.2XJMB 240X2.8X (MISCELLANEOUS)
FORCEPS BIOP RAD 4 LRG CAP 4 (CUTTING FORCEPS) ×3 IMPLANT
FORCEPS BIOP RJ4 240 W/NDL (MISCELLANEOUS)
FORCEPS ESCP3.2XJMB 240X2.8X (MISCELLANEOUS) IMPLANT
GOWN CVR UNV OPN BCK APRN NK (MISCELLANEOUS) ×2 IMPLANT
GOWN ISOL THUMB LOOP REG UNIV (MISCELLANEOUS) ×4
INJECTOR VARIJECT VIN23 (MISCELLANEOUS) IMPLANT
KIT DEFENDO VALVE AND CONN (KITS) IMPLANT
KIT ENDO PROCEDURE OLY (KITS) ×3 IMPLANT
MARKER SPOT ENDO TATTOO 5ML (MISCELLANEOUS) IMPLANT
PAD GROUND ADULT SPLIT (MISCELLANEOUS) IMPLANT
PROBE APC STR FIRE (PROBE) IMPLANT
RETRIEVER NET ROTH 2.5X230 LF (MISCELLANEOUS) IMPLANT
SNARE SHORT THROW 13M SML OVAL (MISCELLANEOUS) ×3 IMPLANT
SNARE SHORT THROW 30M LRG OVAL (MISCELLANEOUS) IMPLANT
SNARE SNG USE RND 15MM (INSTRUMENTS) IMPLANT
SPOT EX ENDOSCOPIC TATTOO (MISCELLANEOUS)
TRAP ETRAP POLY (MISCELLANEOUS) IMPLANT
VARIJECT INJECTOR VIN23 (MISCELLANEOUS)
WATER STERILE IRR 250ML POUR (IV SOLUTION) ×3 IMPLANT

## 2017-07-26 NOTE — H&P (Signed)
   Michael Lame, MD [address] [place], [place] [address] Phone:[phone] Fax : [phone]  Primary Care Physician:  Birdie Sons, MD Primary Gastroenterologist:  Dr. Allen Norris  Pre-Procedure History & Physical: HPI:  Michael Hicks is a 71 y.o. male is here for an colonoscopy.   Past Medical History:  Diagnosis Date  . Erectile dysfunction   . Hx of colonic polyps     Past Surgical History:  Procedure Laterality Date  . COLONOSCOPY    . COLONOSCOPY WITH PROPOFOL N/A 01/14/2016   Procedure: COLONOSCOPY WITH PROPOFOL;  Surgeon: Michael Lame, MD;  Location: [facility];  Service: Endoscopy;  Laterality: N/A;  . POLYPECTOMY  01/14/2016   Procedure: POLYPECTOMY;  Surgeon: Michael Lame, MD;  Location: [facility];  Service: Endoscopy;;    Prior to Admission medications   Medication Sig Start Date End Date Taking? Authorizing Provider  polyethylene glycol-electrolytes (GAVILYTE-N WITH FLAVOR PACK) 420 g solution Drink one 8 oz glass every 20 mins until stools are clear. 07/18/17  Yes Michael Lame, MD  sildenafil (REVATIO) 20 MG tablet Take 2 and a half tabs PO 30 minutes before sexual intercourse 08/10/16   Mar Daring, PA-C    Allergies as of 06/29/2017  . (No Known Allergies)    Family History  Problem Relation Age of Onset  . COPD Mother   . Heart disease Mother   . Hypertension Father     Social History   Social History  . Marital status: Divorced    Spouse name: N/A  . Number of children: 1  . Years of education: 32   Occupational History  . Finance     RETIRED   Social History Main Topics  . Smoking status: Former Smoker    Packs/day: 1.00    Years: 15.00    Types: Cigarettes    Quit date: 12/04/1990  . Smokeless tobacco: Never Used  . Alcohol use 0.6 - 1.2 oz/week    1 - 2 Cans of beer per week  . Drug use: No  . Sexual activity: Yes   Other Topics Concern  . Not on file   Social History Narrative   [place] farm    Review of Systems: See HPI, otherwise negative ROS  Physical Exam: BP 123/74   Pulse 76   Temp 98.1 F (36.7 C)   Resp 16   Ht 5\' 11"  (1.803 m)   Wt 176 lb (79.8 kg)   SpO2 100%   BMI 24.55 kg/m  General:   Alert,  pleasant and cooperative in NAD Head:  Normocephalic and atraumatic. Neck:  Supple; no masses or thyromegaly. Lungs:  Clear throughout to auscultation.    Heart:  Regular rate and rhythm. Abdomen:  Soft, nontender and nondistended. Normal bowel sounds, without guarding, and without rebound.   Neurologic:  Alert and  oriented x4;  grossly normal neurologically.  Impression/Plan: Michael Hicks is here for an colonoscopy to be performed for history of colon polyps  Risks, benefits, limitations, and alternatives regarding  colonoscopy have been reviewed with the patient.  Questions have been answered.  All parties agreeable.   Michael Lame, MD  07/26/2017, 7:40 AM

## 2017-07-26 NOTE — Op Note (Signed)
[facility] Gastroenterology Patient Name: Michael Hicks Procedure Date: 07/26/2017 8:53 AM MRN: [id number] Account #: [id number] Date of Birth: March 24, 1946 Admit Type: Outpatient Age: 71 Room: [facility] OR ROOM 01 Gender: Male Note Status: Finalized Procedure:            Colonoscopy Indications:          High risk colon cancer surveillance: Personal history                        of colonic polyps Providers:            Lucilla Lame MD, MD Referring MD:         Kirstie Peri. Caryn Section, MD (Referring MD) Medicines:            Propofol per Anesthesia Complications:        No immediate complications. Procedure:            Pre-Anesthesia Assessment:                       - Prior to the procedure, a History and Physical was                        performed, and patient medications and allergies were                        reviewed. The patient's tolerance of previous                        anesthesia was also reviewed. The risks and benefits of                        the procedure and the sedation options and risks were                        discussed with the patient. All questions were                        answered, and informed consent was obtained. Prior                        Anticoagulants: The patient has taken no previous                        anticoagulant or antiplatelet agents. ASA Grade                        Assessment: II - A patient with mild systemic disease.                        After reviewing the risks and benefits, the patient was                        deemed in satisfactory condition to undergo the                        procedure.                       After obtaining informed consent, the colonoscope was  passed under direct vision. Throughout the procedure,                        the patient's blood pressure, pulse, and oxygen                        saturations were monitored continuously. The [place] [id number]) was introduced through the                        anus and advanced to the the cecum, identified by                        appendiceal orifice and ileocecal valve. The                        colonoscopy was performed without difficulty. The                        patient tolerated the procedure well. The quality of                        the bowel preparation was excellent. Findings:      The perianal and digital rectal examinations were normal.      Two sessile polyps were found in the cecum. The polyps were 2 to 4 mm in       size. These polyps were removed with a cold biopsy forceps. Resection       and retrieval were complete.      Two sessile polyps were found in the transverse colon. The polyps were 3       to 4 mm in size. These polyps were removed with a cold biopsy forceps.       Resection and retrieval were complete.      Two sessile polyps were found in the descending colon. The polyps were 4       to 5 mm in size. These polyps were removed with a cold biopsy forceps.       Resection and retrieval were complete.      A 5 mm polyp was found in the sigmoid colon. The polyp was sessile. The       polyp was removed with a cold snare. Resection and retrieval were       complete. Impression:           - Two 2 to 4 mm polyps in the cecum, removed with a                        cold biopsy forceps. Resected and retrieved.                       - Two 3 to 4 mm polyps in the transverse colon, removed                        with a cold biopsy forceps. Resected and retrieved.                       - Two 4 to 5 mm polyps in the descending  colon, removed                        with a cold biopsy forceps. Resected and retrieved.                       - One 5 mm polyp in the sigmoid colon, removed with a                        cold snare. Resected and retrieved. Recommendation:       - Discharge patient to home.                       - Resume previous diet.                        - Continue present medications.                       - Await pathology results.                       - Repeat colonoscopy in 3 years for surveillance. Procedure Code(s):    --- Professional ---                       [id number], Colonoscopy, flexible; with removal of tumor(s),                        polyp(s), or other lesion(s) by snare technique                       45380, 41, Colonoscopy, flexible; with biopsy, single                        or multiple Diagnosis Code(s):    --- Professional ---                       Z86.010, Personal history of colonic polyps                       D12.0, Benign neoplasm of cecum                       D12.3, Benign neoplasm of transverse colon (hepatic                        flexure or splenic flexure)                       D12.4, Benign neoplasm of descending colon                       D12.5, Benign neoplasm of sigmoid colon CPT copyright 2016 American Medical Association. All rights reserved. The codes documented in this report are preliminary and upon coder review may  be revised to meet current compliance requirements. Lucilla Lame MD, MD 07/26/2017 9:25:59 AM This report has been signed electronically. Number of Addenda: 0 Note Initiated On: 07/26/2017 8:53 AM Scope Withdrawal Time: 0 hours 11 minutes 42 seconds  Total Procedure Duration: 0 hours 13 minutes 36 seconds       [facility]

## 2017-07-26 NOTE — Anesthesia Procedure Notes (Signed)
Date/Time: 07/26/2017 9:07 AM Performed by: Cameron Ali Pre-anesthesia Checklist: Patient identified, Emergency Drugs available, Suction available, Timeout performed and Patient being monitored Patient Re-evaluated:Patient Re-evaluated prior to induction Oxygen Delivery Method: Nasal cannula Placement Confirmation: positive ETCO2

## 2017-07-26 NOTE — Anesthesia Postprocedure Evaluation (Signed)
Anesthesia Post Note  Patient: Michael Hicks  Procedure(s) Performed: Procedure(s) (LRB): COLONOSCOPY WITH PROPOFOL (N/A)  Patient location during evaluation: PACU Anesthesia Type: General Level of consciousness: awake and alert, oriented and patient cooperative Pain management: pain level controlled Vital Signs Assessment: post-procedure vital signs reviewed and stable Respiratory status: spontaneous breathing, nonlabored ventilation and respiratory function stable Cardiovascular status: blood pressure returned to baseline and stable Postop Assessment: adequate PO intake Anesthetic complications: no    Darrin Nipper

## 2017-07-26 NOTE — Transfer of Care (Signed)
Immediate Anesthesia Transfer of Care Note  Patient: Michael Hicks  Procedure(s) Performed: Procedure(s): COLONOSCOPY WITH PROPOFOL (N/A)  Patient Location: PACU  Anesthesia Type: General  Level of Consciousness: awake, alert  and patient cooperative  Airway and Oxygen Therapy: Patient Spontanous Breathing and Patient connected to supplemental oxygen  Post-op Assessment: Post-op Vital signs reviewed, Patient's Cardiovascular Status Stable, Respiratory Function Stable, Patent Airway and No signs of Nausea or vomiting  Post-op Vital Signs: Reviewed and stable  Complications: No apparent anesthesia complications

## 2017-07-26 NOTE — Anesthesia Preprocedure Evaluation (Signed)
Anesthesia Evaluation  Patient identified by MRN, date of birth, ID band Patient awake    Reviewed: Allergy & Precautions, NPO status , Patient's Chart, lab work & pertinent test results  History of Anesthesia Complications Negative for: history of anesthetic complications  Airway Mallampati: III  TM Distance: >3 FB Neck ROM: Full    Dental no notable dental hx.    Pulmonary former smoker (quit 1992),  Snoring    Pulmonary exam normal breath sounds clear to auscultation       Cardiovascular Exercise Tolerance: Good negative cardio ROS Normal cardiovascular exam Rhythm:Regular Rate:Normal     Neuro/Psych negative neurological ROS     GI/Hepatic Colonic polyps   Endo/Other  negative endocrine ROS  Renal/GU negative Renal ROS     Musculoskeletal   Abdominal   Peds  Hematology negative hematology ROS (+)   Anesthesia Other Findings   Reproductive/Obstetrics                             Anesthesia Physical Anesthesia Plan  ASA: II  Anesthesia Plan: General   Post-op Pain Management:    Induction: Intravenous  PONV Risk Score and Plan: 1 and Ondansetron and Propofol infusion  Airway Management Planned: Natural Airway  Additional Equipment:   Intra-op Plan:   Post-operative Plan:   Informed Consent: I have reviewed the patients History and Physical, chart, labs and discussed the procedure including the risks, benefits and alternatives for the proposed anesthesia with the patient or authorized representative who has indicated his/her understanding and acceptance.     Plan Discussed with: CRNA  Anesthesia Plan Comments:         Anesthesia Quick Evaluation

## 2017-07-27 ENCOUNTER — Encounter: Payer: Self-pay | Admitting: Gastroenterology

## 2017-07-31 ENCOUNTER — Encounter: Payer: Self-pay | Admitting: Gastroenterology

## 2017-08-13 ENCOUNTER — Ambulatory Visit (INDEPENDENT_AMBULATORY_CARE_PROVIDER_SITE_OTHER): Payer: Medicare HMO | Admitting: Family Medicine

## 2017-08-13 ENCOUNTER — Encounter: Payer: Self-pay | Admitting: Family Medicine

## 2017-08-13 VITALS — BP 136/84 | HR 68 | Temp 98.4°F | Resp 16 | Wt 183.0 lb

## 2017-08-13 DIAGNOSIS — M222X1 Patellofemoral disorders, right knee: Secondary | ICD-10-CM | POA: Diagnosis not present

## 2017-08-13 DIAGNOSIS — W57XXXA Bitten or stung by nonvenomous insect and other nonvenomous arthropods, initial encounter: Secondary | ICD-10-CM

## 2017-08-13 MED ORDER — TRIAMCINOLONE ACETONIDE 0.1 % EX CREA: 1.0000 "application " | TOPICAL_CREAM | Freq: Three times a day (TID) | CUTANEOUS | 0 refills | Status: DC

## 2017-08-13 NOTE — Progress Notes (Signed)
Subjective:     Patient ID: Michael Hicks, male   DOB: 01/29/1946, 71 y.o.   MRN: [id number]  HPI  Chief Complaint  Patient presents with  . Knee Pain    Right knee, started around 08/02/2017  States he had been working much of the day trimming hedges when he first felt discomfort. He subsequently went hiking after taking Rx strength Aleve from his girlfriend and has used ice. Denies prior surgery or specific injury. Also wishes more steroid cream for insect bites.   Review of Systems     Objective:   Physical Exam  Constitutional: He appears well-developed and well-nourished. No distress.  Musculoskeletal:  Right knee with mild swelling/no erythema, mild tenderness over the superior aspect of his patella. FROM but increased discomfort at end range of flexion. Ligaments stable.       Assessment:    1. Patellofemoral syndrome, right:   2. Insect bite, initial encounter - triamcinolone cream (KENALOG) 0.1 %; Apply 1 application topically 3 (three) times daily. To insect bites  Dispense: 60 g; Refill: 0    Plan:    Discussed taking two Aleve twice daily with food and applying ice several x day. X-ray if not improving over the next week.

## 2017-08-13 NOTE — Patient Instructions (Signed)
Discussed use of two Aleve twice daily with food. Apply ice for 20 minutes several x day. If not improving call for x-ray.

## 2017-08-21 DIAGNOSIS — L814 Other melanin hyperpigmentation: Secondary | ICD-10-CM | POA: Diagnosis not present

## 2017-08-21 DIAGNOSIS — L578 Other skin changes due to chronic exposure to nonionizing radiation: Secondary | ICD-10-CM | POA: Diagnosis not present

## 2017-08-21 DIAGNOSIS — D692 Other nonthrombocytopenic purpura: Secondary | ICD-10-CM | POA: Diagnosis not present

## 2017-08-21 DIAGNOSIS — L570 Actinic keratosis: Secondary | ICD-10-CM | POA: Diagnosis not present

## 2017-08-21 DIAGNOSIS — Z1283 Encounter for screening for malignant neoplasm of skin: Secondary | ICD-10-CM | POA: Diagnosis not present

## 2017-11-05 ENCOUNTER — Ambulatory Visit: Payer: Medicare HMO | Admitting: Family Medicine

## 2017-11-05 ENCOUNTER — Encounter: Payer: Self-pay | Admitting: Family Medicine

## 2017-11-05 VITALS — BP 120/80 | HR 86 | Temp 98.6°F | Resp 16 | Wt 183.6 lb

## 2017-11-05 DIAGNOSIS — J069 Acute upper respiratory infection, unspecified: Secondary | ICD-10-CM

## 2017-11-05 NOTE — Progress Notes (Signed)
Subjective:     Patient ID: Michael Hicks, male   DOB: Dec 28, 1945, 71 y.o.   MRN: [id number] Chief Complaint  Patient presents with  . Cough    Patient comes in office today with complaints of cough and congestion for one week. Patient states that last night he began wheezing, patient has been taking otc Mucinex D for relief.   States initial cold sx started 12 days ago but sinuses have improved. Cough is minimally productive. HPI   Review of Systems     Objective:   Physical Exam  Constitutional: He appears well-developed and well-nourished. No distress.  Ears: T.M's intact without inflammation Throat: no tonsillar enlargement or exudate Neck: no cervical adenopathy Lungs: clear     Assessment:    1. Viral upper respiratory tract infection     Plan:    Discussed use of Mucinex and Delsym. To call later this week if cough not improving.

## 2017-11-05 NOTE — Patient Instructions (Signed)
Discussed use of Mucinex for congestion and Delsym for cough. Call me around Thursday if you don't feel cough is improving.

## 2017-11-30 ENCOUNTER — Telehealth: Payer: Self-pay | Admitting: Family Medicine

## 2017-11-30 NOTE — Telephone Encounter (Signed)
Patient was advised.  

## 2017-11-30 NOTE — Telephone Encounter (Signed)
Can take OTC kaopectate or imodium. Avoid dairy, avoid fruit. Call if any fever, severe cramping, or blood in stool.

## 2017-11-30 NOTE — Telephone Encounter (Signed)
Pt states he has diarrhea for a few days and wants to know what to take for it.

## 2017-11-30 NOTE — Telephone Encounter (Signed)
Called pt back for more info. Patient stated he woke up yesterday with nausea and diarrhea. Patient stated he was in bed all day yesterday feeling bad. Patient had continuing diarrhea all day yesterday and still has some today but not as bad. No symptoms of abdominal pain, or cramping. Patient wanted to know what he can do to help with diarrhea. Patient also stated that his daughter and grandson had the same symptoms as him over the weekend, which only lasted a couple of days. Please advise?

## 2017-12-17 ENCOUNTER — Ambulatory Visit (INDEPENDENT_AMBULATORY_CARE_PROVIDER_SITE_OTHER): Payer: Medicare HMO | Admitting: Family Medicine

## 2017-12-17 ENCOUNTER — Encounter: Payer: Self-pay | Admitting: Family Medicine

## 2017-12-17 ENCOUNTER — Ambulatory Visit (INDEPENDENT_AMBULATORY_CARE_PROVIDER_SITE_OTHER): Payer: Medicare HMO

## 2017-12-17 VITALS — BP 138/80 | HR 88 | Temp 97.4°F | Ht 71.0 in | Wt 181.0 lb

## 2017-12-17 DIAGNOSIS — Z0000 Encounter for general adult medical examination without abnormal findings: Secondary | ICD-10-CM | POA: Diagnosis not present

## 2017-12-17 DIAGNOSIS — Z125 Encounter for screening for malignant neoplasm of prostate: Secondary | ICD-10-CM | POA: Diagnosis not present

## 2017-12-17 NOTE — Progress Notes (Signed)
Patient: Michael Hicks, Male    DOB: 04-09-1946, 72 y.o.   MRN: [id number] Visit Date: 12/17/2017  Today's Provider: Lelon Huh, MD   Chief Complaint  Patient presents with  . Annual Exam   Subjective:   Patient saw McKenzie at 1:15 pm for AVW.    Annual physical exam Michael Hicks is a 72 y.o. male who presents today for health maintenance and complete physical. He feels well. He reports exercising yes walking. He reports he is sleeping well.  ----------------------------------------------------------------  ED (erectile dysfunction) of organic origin From 1 year ago-no changes.   Review of Systems  Gastrointestinal: Positive for abdominal pain.  Musculoskeletal: Positive for joint swelling.  All other systems reviewed and are negative.   Social History      He  reports that he quit smoking about 27 years ago. His smoking use included cigarettes. He has a 15.00 pack-year smoking history. he has never used smokeless tobacco. He reports that he drinks about 4.2 - 8.4 oz of alcohol per week. He reports that he does not use drugs.       Social History   Socioeconomic History  . Marital status: Divorced    Spouse name: Not on file  . Number of children: 1  . Years of education: 54  . Highest education level: Associate degree: occupational, Hotel manager, or vocational program  Social Needs  . Financial resource strain: Not hard at all  . Food insecurity - worry: Never true  . Food insecurity - inability: Never true  . Transportation needs - medical: No  . Transportation needs - non-medical: No  Occupational History  . Occupation: Finance    Comment: RETIRED  Tobacco Use  . Smoking status: Former Smoker    Packs/day: 1.00    Years: 15.00    Pack years: 15.00    Types: Cigarettes    Last attempt to quit: 12/04/1990    Years since quitting: 27.0  . Smokeless tobacco: Never Used  Substance and Sexual Activity  . Alcohol use: Yes    Alcohol/week: 4.2 -  8.4 oz    Types: 7 - 14 Cans of beer per week  . Drug use: No  . Sexual activity: Yes  Other Topics Concern  . Not on file  Social History Narrative   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     Past Medical History:  Diagnosis Date  . Erectile dysfunction   . Hx of colonic polyps      Patient Active Problem List   Diagnosis Date Noted  . Benign neoplasm of cecum   . Polyp of sigmoid colon   . Personal history of colonic polyps   . Benign neoplasm of sigmoid colon   . Benign neoplasm of descending colon   . Benign neoplasm of transverse colon   . Benign neoplasm of ascending colon   . Body mass index (BMI) of 25.0-25.9 in adult 06/10/2015  . Accumulation of fluid in tissues 06/10/2015  . ED (erectile dysfunction) of organic origin 06/10/2015    Past Surgical History:  Procedure Laterality Date  . COLONOSCOPY    . COLONOSCOPY WITH PROPOFOL N/A 01/14/2016   Procedure: COLONOSCOPY WITH PROPOFOL;  Surgeon: Lucilla Lame, MD;  Location: [place];  Service: Endoscopy;  Laterality: N/A;  . COLONOSCOPY WITH PROPOFOL N/A 07/26/2017   Procedure: COLONOSCOPY WITH PROPOFOL;  Surgeon: Lucilla Lame, MD;  Location: [place];  Service: Gastroenterology;  Laterality: N/A;  .  POLYPECTOMY  01/14/2016   Procedure: POLYPECTOMY;  Surgeon: Lucilla Lame, MD;  Location: [place];  Service: Endoscopy;;    Family History        Family Status  Relation Name Status  . Mother  Deceased  . Father  Deceased       cerebrovascular accident  . Brother  Deceased  . Brother  Alive  . Brother  Alive        His family history includes COPD in his mother; Cancer in his brother; Heart disease in his mother; Hypertension in his father.     No Known Allergies   Current Outpatient Medications:  .  sildenafil (REVATIO) 20 MG tablet, Take 2 and a half tabs PO 30 minutes before sexual intercourse, Disp: 50 tablet, Rfl: 4   Patient Care Team: Birdie Sons, MD as PCP -  General (Family Medicine) Idelle Leech, OD as Consulting Physician (Optometry) Carmon Ginsberg, [place] as Referring Physician (Family Medicine)      Objective:   Vitals:    BP  138/80 (BP Location: Left Arm)     Pulse  88     Temp  97.4 F (36.3 C) Abnormal   (Oral)     Ht  5\' 11"  (1.803 m)     Wt  181 lb (82.1 kg)      BMI  25.24 kg/m        Physical Exam   General Appearance:    Alert, cooperative, no distress, appears stated age  Head:    Normocephalic, without obvious abnormality, atraumatic  Eyes:    PERRL, conjunctiva/corneas clear, EOM's intact, fundi    benign, both eyes       Ears:    Normal TM's and external ear canals, both ears  Nose:   Nares normal, septum midline, mucosa normal, no drainage   or sinus tenderness  Throat:   Lips, mucosa, and tongue normal; teeth and gums normal  Neck:   Supple, symmetrical, trachea midline, no adenopathy;       thyroid:  No enlargement/tenderness/nodules; no carotid   bruit or JVD  Back:     Symmetric, no curvature, ROM normal, no CVA tenderness  Lungs:     Clear to auscultation bilaterally, respirations unlabored  Chest wall:    No tenderness or deformity  Heart:    Regular rate and rhythm, S1 and S2 normal, no murmur, rub   or gallop  Abdomen:     Soft, non-tender, bowel sounds active all four quadrants,    no masses, no organomegaly  Genitalia:    deferred  Rectal:    deferred  Extremities:   Extremities normal, atraumatic, no cyanosis or edema  Pulses:   2+ and symmetric all extremities  Skin:   Skin color, texture, turgor normal, no rashes or lesions  Lymph nodes:   Cervical, supraclavicular, and axillary nodes normal  Neurologic:   CNII-XII intact. Normal strength, sensation and reflexes      throughout    Depression Screen PHQ 2/9 Scores 12/17/2017 12/17/2017 12/13/2016 12/01/2015  PHQ - 2 Score 0 0 0 0  PHQ- 9 Score 0 - - -      Assessment & Plan:     Routine Health Maintenance and Physical  Exam  Exercise Activities and Dietary recommendations Goals    . Exercise 3x per week (30 min per time)     Starting 1/10/018, I will start back exercising 3 times a week for at least 30 minutes.  12/17/17- Recommend to start back walking 5 days a week for at least 30 minutes at a time. Pt states he plans to walk 3 miles at a time.        Immunization History  Administered Date(s) Administered  . Influenza, High Dose Seasonal PF 10/20/2016  . Influenza,inj,Quad PF,6+ Mos 12/01/2015  . Influenza-Unspecified 09/28/2017  . Pneumococcal Conjugate-13 11/24/2013  . Pneumococcal Polysaccharide-23 11/25/2014  . Td 11/22/2012  . Tdap 11/22/2012  . Zoster 11/22/2012    Health Maintenance  Topic Date Due  . COLONOSCOPY  07/26/2020  . TETANUS/TDAP  11/22/2022  . INFLUENZA VACCINE  Completed  . Hepatitis C Screening  Completed  . PNA vac Low Risk Adult  Completed     Discussed health benefits of physical activity, and encouraged him to engage in regular exercise appropriate for his age and condition.    --------------------------------------------------------------------  1. Annual physical exam  - Basic metabolic panel  2. Prostate cancer screening  - PSA   Lelon Huh, MD  [facility]

## 2017-12-17 NOTE — Patient Instructions (Signed)
[name] , Thank you for taking time to come for your Medicare Wellness Visit. I appreciate your ongoing commitment to your health goals. Please review the following plan we discussed and let me know if I can assist you in the future.   Screening recommendations/referrals: Colonoscopy: Up to date Recommended yearly ophthalmology/optometry visit for glaucoma screening and checkup Recommended yearly dental visit for hygiene and checkup  Vaccinations: Influenza vaccine: Up to date Pneumococcal vaccine: Up to date Tdap vaccine: Up to date Shingles vaccine: Zostavax received 11/2012, pt to check in to pricing on Shingrix.     Advanced directives: Advance directive discussed with you today. I have provided a copy for you to complete at home and have notarized. Once this is complete please bring a copy in to our office so we can scan it into your chart.  Conditions/risks identified: 12/17/17- Recommend to start back walking 5 days a week for at least 30 minutes at a time. Pt states he plans to walk 3 miles at a time.   Next appointment: 2:00 PM today  Preventive Care 70 Years and Older, Male Preventive care refers to lifestyle choices and visits with your health care provider that can promote health and wellness. What does preventive care include?  A yearly physical exam. This is also called an annual well check.  Dental exams once or twice a year.  Routine eye exams. Ask your health care provider how often you should have your eyes checked.  Personal lifestyle choices, including:  Daily care of your teeth and gums.  Regular physical activity.  Eating a healthy diet.  Avoiding tobacco and drug use.  Limiting alcohol use.  Practicing safe sex.  Taking low doses of aspirin every day.  Taking vitamin and mineral supplements as recommended by your health care provider. What happens during an annual well check? The services and screenings done by your health care provider during  your annual well check will depend on your age, overall health, lifestyle risk factors, and family history of disease. Counseling  Your health care provider may ask you questions about your:  Alcohol use.  Tobacco use.  Drug use.  Emotional well-being.  Home and relationship well-being.  Sexual activity.  Eating habits.  History of falls.  Memory and ability to understand (cognition).  Work and work Statistician. Screening  You may have the following tests or measurements:  Height, weight, and BMI.  Blood pressure.  Lipid and cholesterol levels. These may be checked every 5 years, or more frequently if you are over 9 years old.  Skin check.  Lung cancer screening. You may have this screening every year starting at age 70 if you have a 30-pack-year history of smoking and currently smoke or have quit within the past 15 years.  Fecal occult blood test (FOBT) of the stool. You may have this test every year starting at age 70.  Flexible sigmoidoscopy or colonoscopy. You may have a sigmoidoscopy every 5 years or a colonoscopy every 10 years starting at age 70.  Prostate cancer screening. Recommendations will vary depending on your family history and other risks.  Hepatitis C blood test.  Hepatitis B blood test.  Sexually transmitted disease (STD) testing.  Diabetes screening. This is done by checking your blood sugar (glucose) after you have not eaten for a while (fasting). You may have this done every 1-3 years.  Abdominal aortic aneurysm (AAA) screening. You may need this if you are a current or former smoker.  Osteoporosis. You  may be screened starting at age 30 if you are at high risk. Talk with your health care provider about your test results, treatment options, and if necessary, the need for more tests. Vaccines  Your health care provider may recommend certain vaccines, such as:  Influenza vaccine. This is recommended every year.  Tetanus, diphtheria, and  acellular pertussis (Tdap, Td) vaccine. You may need a Td booster every 10 years.  Zoster vaccine. You may need this after age 70.  Pneumococcal 13-valent conjugate (PCV13) vaccine. One dose is recommended after age 70.  Pneumococcal polysaccharide (PPSV23) vaccine. One dose is recommended after age 55. Talk to your health care provider about which screenings and vaccines you need and how often you need them. This information is not intended to replace advice given to you by your health care provider. Make sure you discuss any questions you have with your health care provider. Document Released: 12/17/2015 Document Revised: 08/09/2016 Document Reviewed: 09/21/2015 Elsevier Interactive Patient Education  2017 [place] Prevention in the Home Falls can cause injuries. They can happen to people of all ages. There are many things you can do to make your home safe and to help prevent falls. What can I do on the outside of my home?  Regularly fix the edges of walkways and driveways and fix any cracks.  Remove anything that might make you trip as you walk through a door, such as a raised step or threshold.  Trim any bushes or trees on the path to your home.  Use bright outdoor lighting.  Clear any walking paths of anything that might make someone trip, such as rocks or tools.  Regularly check to see if handrails are loose or broken. Make sure that both sides of any steps have handrails.  Any raised decks and porches should have guardrails on the edges.  Have any leaves, snow, or ice cleared regularly.  Use sand or salt on walking paths during winter.  Clean up any spills in your garage right away. This includes oil or grease spills. What can I do in the bathroom?  Use night lights.  Install grab bars by the toilet and in the tub and shower. Do not use towel bars as grab bars.  Use non-skid mats or decals in the tub or shower.  If you need to sit down in the shower, use  a plastic, non-slip stool.  Keep the floor dry. Clean up any water that spills on the floor as soon as it happens.  Remove soap buildup in the tub or shower regularly.  Attach bath mats securely with double-sided non-slip rug tape.  Do not have throw rugs and other things on the floor that can make you trip. What can I do in the bedroom?  Use night lights.  Make sure that you have a light by your bed that is easy to reach.  Do not use any sheets or blankets that are too big for your bed. They should not hang down onto the floor.  Have a firm chair that has side arms. You can use this for support while you get dressed.  Do not have throw rugs and other things on the floor that can make you trip. What can I do in the kitchen?  Clean up any spills right away.  Avoid walking on wet floors.  Keep items that you use a lot in easy-to-reach places.  If you need to reach something above you, use a strong step stool that  has a grab bar.  Keep electrical cords out of the way.  Do not use floor polish or wax that makes floors slippery. If you must use wax, use non-skid floor wax.  Do not have throw rugs and other things on the floor that can make you trip. What can I do with my stairs?  Do not leave any items on the stairs.  Make sure that there are handrails on both sides of the stairs and use them. Fix handrails that are broken or loose. Make sure that handrails are as long as the stairways.  Check any carpeting to make sure that it is firmly attached to the stairs. Fix any carpet that is loose or worn.  Avoid having throw rugs at the top or bottom of the stairs. If you do have throw rugs, attach them to the floor with carpet tape.  Make sure that you have a light switch at the top of the stairs and the bottom of the stairs. If you do not have them, ask someone to add them for you. What else can I do to help prevent falls?  Wear shoes that:  Do not have high heels.  Have  rubber bottoms.  Are comfortable and fit you well.  Are closed at the toe. Do not wear sandals.  If you use a stepladder:  Make sure that it is fully opened. Do not climb a closed stepladder.  Make sure that both sides of the stepladder are locked into place.  Ask someone to hold it for you, if possible.  Clearly mark and make sure that you can see:  Any grab bars or handrails.  First and last steps.  Where the edge of each step is.  Use tools that help you move around (mobility aids) if they are needed. These include:  Canes.  Walkers.  Scooters.  Crutches.  Turn on the lights when you go into a dark area. Replace any light bulbs as soon as they burn out.  Set up your furniture so you have a clear path. Avoid moving your furniture around.  If any of your floors are uneven, fix them.  If there are any pets around you, be aware of where they are.  Review your medicines with your doctor. Some medicines can make you feel dizzy. This can increase your chance of falling. Ask your doctor what other things that you can do to help prevent falls. This information is not intended to replace advice given to you by your health care provider. Make sure you discuss any questions you have with your health care provider. Document Released: 09/16/2009 Document Revised: 04/27/2016 Document Reviewed: 12/25/2014 Elsevier Interactive Patient Education  2017 [employer].

## 2017-12-17 NOTE — Progress Notes (Signed)
Subjective:   Michael Hicks is a 72 y.o. male who presents for Medicare Annual/Subsequent preventive examination.  Review of Systems:  N/A  Cardiac Risk Factors include: advanced age (>25men, >79 women);male gender;sedentary lifestyle     Objective:    Vitals: BP 138/80 (BP Location: Left Arm)   Pulse 88   Temp (!) 97.4 F (36.3 C) (Oral)   Ht 5\' 11"  (1.803 m)   Wt 181 lb (82.1 kg)   BMI 25.24 kg/m   Body mass index is 25.24 kg/m.  Advanced Directives 12/17/2017 07/26/2017 12/13/2016 01/14/2016 12/01/2015 06/10/2015  Does Patient Have a Medical Advance Directive? No Yes No No Yes Yes  Type of Advance Directive - - - - Living will;Healthcare Power of Attorney Living will  Does patient want to make changes to medical advance directive? - Yes (MAU/Ambulatory/Procedural Areas - Information given) - - - -  Would patient like information on creating a medical advance directive? Yes (MAU/Ambulatory/Procedural Areas - Information given) - Yes (MAU/Ambulatory/Procedural Areas - Information given) - - -    Tobacco Social History   Tobacco Use  Smoking Status Former Smoker  . Packs/day: 1.00  . Years: 15.00  . Pack years: 15.00  . Types: Cigarettes  . Last attempt to quit: 12/04/1990  . Years since quitting: 27.0  Smokeless Tobacco Never Used     Counseling given: Not Answered   Clinical Intake:  Pre-visit preparation completed: Yes  Pain : No/denies pain Pain Score: 0-No pain     Nutritional Status: BMI 25 -29 Overweight Nutritional Risks: None Diabetes: No  How often do you need to have someone help you when you read instructions, pamphlets, or other written materials from your doctor or pharmacy?: 1 - Never  Interpreter Needed?: No  Information entered by :: [facility], LPN  Past Medical History:  Diagnosis Date  . Erectile dysfunction   . Hx of colonic polyps    Past Surgical History:  Procedure Laterality Date  . COLONOSCOPY    . COLONOSCOPY WITH  PROPOFOL N/A 01/14/2016   Procedure: COLONOSCOPY WITH PROPOFOL;  Surgeon: Lucilla Lame, MD;  Location: [facility];  Service: Endoscopy;  Laterality: N/A;  . COLONOSCOPY WITH PROPOFOL N/A 07/26/2017   Procedure: COLONOSCOPY WITH PROPOFOL;  Surgeon: Lucilla Lame, MD;  Location: [facility];  Service: Gastroenterology;  Laterality: N/A;  . POLYPECTOMY  01/14/2016   Procedure: POLYPECTOMY;  Surgeon: Lucilla Lame, MD;  Location: [facility];  Service: Endoscopy;;   Family History  Problem Relation Age of Onset  . COPD Mother   . Heart disease Mother   . Hypertension Father   . Cancer Brother        bone cancer   Social History   Socioeconomic History  . Marital status: Divorced    Spouse name: None  . Number of children: 1  . Years of education: 53  . Highest education level: Associate degree: occupational, Hotel manager, or vocational program  Social Needs  . Financial resource strain: Not hard at all  . Food insecurity - worry: Never true  . Food insecurity - inability: Never true  . Transportation needs - medical: No  . Transportation needs - non-medical: No  Occupational History  . Occupation: Finance    Comment: RETIRED  Tobacco Use  . Smoking status: Former Smoker    Packs/day: 1.00    Years: 15.00    Pack years: 15.00    Types: Cigarettes    Last attempt to quit: 12/04/1990  Years since quitting: 27.0  . Smokeless tobacco: Never Used  Substance and Sexual Activity  . Alcohol use: Yes    Alcohol/week: 4.2 - 8.4 oz    Types: 7 - 14 Cans of beer per week  . Drug use: No  . Sexual activity: Yes  Other Topics Concern  . None  Social History Narrative   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     Outpatient Encounter Medications as of 12/17/2017  Medication Sig  . sildenafil (REVATIO) 20 MG tablet Take 2 and a half tabs PO 30 minutes before sexual intercourse   No facility-administered encounter medications on file as of 12/17/2017.      Activities of Daily Living In your present state of health, do you have any difficulty performing the following activities: 12/17/2017 07/26/2017  Hearing? N N  Vision? N N  Difficulty concentrating or making decisions? N N  Walking or climbing stairs? N N  Dressing or bathing? N N  Doing errands, shopping? N -  Preparing Food and eating ? N -  Using the Toilet? N -  In the past six months, have you accidently leaked urine? N -  Do you have problems with loss of bowel control? N -  Managing your Medications? N -  Managing your Finances? N -  Housekeeping or managing your Housekeeping? N -  Some recent data might be hidden    Patient Care Team: Birdie Sons, MD as PCP - General (Family Medicine) Idelle Leech, OD as Consulting Physician (Optometry) Carmon Ginsberg, [place] as Referring Physician (Family Medicine)   Assessment:   This is a routine wellness examination for [name].  Exercise Activities and Dietary recommendations Current Exercise Habits: Home exercise routine, Type of exercise: walking;Other - see comments(and hikes), Time (Minutes): > 60(1.10 miutes), Frequency (Times/Week): 2, Weekly Exercise (Minutes/Week): 0, Intensity: Mild, Exercise limited by: None identified  Goals    . Exercise 3x per week (30 min per time)     Starting 1/10/018, I will start back exercising 3 times a week for at least 30 minutes.   12/17/17- Recommend to start back walking 5 days a week for at least 30 minutes at a time. Pt states he plans to walk 3 miles at a time.        Fall Risk Fall Risk  12/17/2017 12/13/2016 12/01/2015  Falls in the past year? Yes No No  Number falls in past yr: 1 - -  Injury with Fall? No - -  Follow up Falls prevention discussed - -   Is the patient's home free of loose throw rugs in walkways, pet beds, electrical cords, etc?   yes      Grab bars in the bathroom? no      Handrails on the stairs?   yes      Adequate lighting?   yes  Timed Get Up and  Go Performed: N/A  Depression Screen PHQ 2/9 Scores 12/17/2017 12/17/2017 12/13/2016 12/01/2015  PHQ - 2 Score 0 0 0 0  PHQ- 9 Score 0 - - -    Cognitive Function     6CIT Screen 12/17/2017  What Year? 0 points  What month? 0 points  What time? 0 points  Count back from 20 0 points  Months in reverse 0 points  Repeat phrase 4 points  Total Score 4    Immunization History  Administered Date(s) Administered  . Influenza, High Dose Seasonal PF 10/20/2016  . Influenza,inj,Quad PF,6+ Mos 12/01/2015  .  Influenza-Unspecified 09/28/2017  . Pneumococcal Conjugate-13 11/24/2013  . Pneumococcal Polysaccharide-23 11/25/2014  . Td 11/22/2012  . Tdap 11/22/2012  . Zoster 11/22/2012    Qualifies for Shingles Vaccine? Due for Shingles vaccine. Declined my offer to administer today. Education has been provided regarding the importance of this vaccine. Pt has been advised to call her insurance company to determine her out of pocket expense. Advised she may also receive this vaccine at her local pharmacy or Health Dept. Verbalized acceptance and understanding.   Screening Tests Health Maintenance  Topic Date Due  . COLONOSCOPY  07/26/2020  . TETANUS/TDAP  11/22/2022  . INFLUENZA VACCINE  Completed  . Hepatitis C Screening  Completed  . PNA vac Low Risk Adult  Completed   Cancer Screenings: Lung: Low Dose CT Chest recommended if Age 15-80 years, 30 pack-year currently smoking OR have quit w/in 15years. Patient does not qualify. Colorectal: Up to date  Additional Screenings:  Hepatitis B/HIV/Syphillis: Pt declines today.  Hepatitis C Screening: Pt declines today.     Plan:  I have personally reviewed and addressed the Medicare Annual Wellness questionnaire and have noted the following in the patient's chart:  A. Medical and social history B. Use of alcohol, tobacco or illicit drugs  C. Current medications and supplements D. Functional ability and status E.  Nutritional status F.   Physical activity G. Advance directives H. List of other physicians I.  Hospitalizations, surgeries, and ER visits in previous 12 months J.  [place] such as hearing and vision if needed, cognitive and depression L. Referrals and appointments - none  In addition, I have reviewed and discussed with patient certain preventive protocols, quality metrics, and best practice recommendations. A written personalized care plan for preventive services as well as general preventive health recommendations were provided to patient.  See attached scanned questionnaire for additional information.   Signed,  Fabio Neighbors, LPN Nurse Health Advisor   Nurse Recommendations: None.

## 2017-12-17 NOTE — Patient Instructions (Addendum)
   The CDC recommends two doses of Shingrix (the shingles vaccine) separated by 2 to 6 months for adults age 70 years and older. I recommend checking with your insurance plan regarding coverage for this vaccine.   

## 2017-12-18 LAB — BASIC METABOLIC PANEL
BUN / CREAT RATIO: 21 (ref 10–24)
BUN: 20 mg/dL (ref 8–27)
CO2: 25 mmol/L (ref 20–29)
CREATININE: 0.97 mg/dL (ref 0.76–1.27)
Calcium: 9.3 mg/dL (ref 8.6–10.2)
Chloride: 99 mmol/L (ref 96–106)
GFR calc non Af Amer: 79 mL/min/{1.73_m2} (ref >59)
GFR, EST AFRICAN AMERICAN: 92 mL/min/{1.73_m2} (ref >59)
Glucose: 92 mg/dL (ref 65–99)
Potassium: 4.3 mmol/L (ref 3.5–5.2)
SODIUM: 140 mmol/L (ref 134–144)

## 2017-12-18 LAB — PSA: Prostate Specific Ag, Serum: 1.8 ng/mL (ref 0.0–4.0)

## 2017-12-24 DIAGNOSIS — H5213 Myopia, bilateral: Secondary | ICD-10-CM | POA: Diagnosis not present

## 2018-01-23 ENCOUNTER — Other Ambulatory Visit: Payer: Self-pay | Admitting: Physician Assistant

## 2018-01-23 DIAGNOSIS — N529 Male erectile dysfunction, unspecified: Secondary | ICD-10-CM

## 2018-04-15 ENCOUNTER — Ambulatory Visit (INDEPENDENT_AMBULATORY_CARE_PROVIDER_SITE_OTHER): Payer: Medicare HMO | Admitting: Family Medicine

## 2018-04-15 ENCOUNTER — Encounter: Payer: Self-pay | Admitting: Family Medicine

## 2018-04-15 VITALS — BP 120/68 | HR 75 | Temp 98.8°F | Resp 16 | Wt 187.0 lb

## 2018-04-15 DIAGNOSIS — M109 Gout, unspecified: Secondary | ICD-10-CM

## 2018-04-15 DIAGNOSIS — M79675 Pain in left toe(s): Secondary | ICD-10-CM

## 2018-04-15 MED ORDER — INDOMETHACIN 50 MG PO CAPS: 50.0000 mg | ORAL_CAPSULE | Freq: Two times a day (BID) | ORAL | 0 refills | Status: DC

## 2018-04-15 MED ORDER — CEPHALEXIN 500 MG PO CAPS: 500.0000 mg | ORAL_CAPSULE | Freq: Four times a day (QID) | ORAL | 0 refills | Status: AC

## 2018-04-15 NOTE — Patient Instructions (Signed)

## 2018-04-15 NOTE — Progress Notes (Signed)
       Patient: Michael Hicks Male    DOB: 23-Jul-1946   72 y.o.   MRN: [id number] Visit Date: 04/15/2018  Today's Provider: Lelon Huh, MD   No chief complaint on file.  Subjective:    HPI  Patient states his left big has been sore for 4 days. This morning his toe is swollen and red. Patient states it hurts to put pressure on his foot. Patient states he has not injured his foot. Patient has been taking otc Aleve.   No Known Allergies   Current Outpatient Medications:  .  sildenafil (REVATIO) 20 MG tablet, TAKE TWO AND A HALF (2.5) TABLETS BY MOUTH 30 MINUTES BEFORE SEXUAL INTERCOURSE, Disp: 50 tablet, Rfl: 2  Review of Systems  Constitutional: Negative for appetite change, chills and fever.  Respiratory: Negative for chest tightness, shortness of breath and wheezing.   Cardiovascular: Negative for chest pain and palpitations.  Gastrointestinal: Negative for abdominal pain, nausea and vomiting.    Social History   Tobacco Use  . Smoking status: Former Smoker    Packs/day: 1.00    Years: 15.00    Pack years: 15.00    Types: Cigarettes    Last attempt to quit: 12/04/1990    Years since quitting: 27.3  . Smokeless tobacco: Never Used  Substance Use Topics  . Alcohol use: Yes    Alcohol/week: 4.2 - 8.4 oz    Types: 7 - 14 Cans of beer per week   Objective:   BP 120/68 (BP Location: Right Arm, Patient Position: Sitting, Cuff Size: Normal)   Pulse 75   Temp 98.8 F (37.1 C) (Oral)   Resp 16   Wt 187 lb (84.8 kg)   SpO2 99%   BMI 26.08 kg/m     Physical Exam  General appearance: alert, well developed, well nourished, cooperative and in no distress Head: Normocephalic, without obvious abnormality, atraumatic Respiratory: Respirations even and unlabored, normal respiratory rate Extremities: Red and swollen left great toe and dorsal aspect of medial forefoot c/w gout.      Assessment & Plan:     1. Pain of toe of left foot Likely gout. Cover for gout and  infection while awaiting uric acid levels.  - indomethacin (INDOCIN) 50 MG capsule; Take 1 capsule (50 mg total) by mouth 2 (two) times daily with a meal.  Dispense: 30 capsule; Refill: 0 - cephALEXin (KEFLEX) 500 MG capsule; Take 1 capsule (500 mg total) by mouth 4 (four) times daily for 7 days.  Dispense: 28 capsule; Refill: 0  2. Acute gout of left foot, unspecified cause discussed gout precautions.  - Uric acid - indomethacin (INDOCIN) 50 MG capsule; Take 1 capsule (50 mg total) by mouth 2 (two) times daily with a meal.  Dispense: 30 capsule; Refill: 0        Lelon Huh, MD  [facility]

## 2018-04-16 LAB — URIC ACID: URIC ACID: 7 mg/dL (ref 3.7–8.6)

## 2018-05-06 ENCOUNTER — Ambulatory Visit (INDEPENDENT_AMBULATORY_CARE_PROVIDER_SITE_OTHER): Payer: Medicare HMO | Admitting: Family Medicine

## 2018-05-06 ENCOUNTER — Encounter: Payer: Self-pay | Admitting: Family Medicine

## 2018-05-06 VITALS — BP 110/62 | HR 71 | Temp 98.4°F | Resp 16 | Wt 186.0 lb

## 2018-05-06 DIAGNOSIS — M109 Gout, unspecified: Secondary | ICD-10-CM

## 2018-05-06 DIAGNOSIS — M79675 Pain in left toe(s): Secondary | ICD-10-CM | POA: Diagnosis not present

## 2018-05-06 MED ORDER — INDOMETHACIN 50 MG PO CAPS: 50.0000 mg | ORAL_CAPSULE | Freq: Two times a day (BID) | ORAL | 0 refills | Status: DC

## 2018-05-06 MED ORDER — COLCHICINE 0.6 MG PO TABS: ORAL_TABLET | ORAL | 0 refills | Status: DC

## 2018-05-06 NOTE — Progress Notes (Signed)
       Patient: Michael Hicks Male    DOB: 03-25-1946   72 y.o.   MRN: [id number] Visit Date: 05/06/2018  Today's Provider: Lelon Huh, MD   Chief Complaint  Patient presents with  . Foot Pain   Subjective:    HPI  Acute gout of left foot, unspecified cause From 04/15/2018-labs checked, showing uric acid was 7.0. Advised to drink plenty of fluids. Given rx for indomethacin (INDOCIN) 50 MG capsule.Today patient comes in reporting the pain the pain flared back up 3 days ago. Patient has been taking Indomethacin that was prescribed last month, and he reports the pain had resolved. Patient reports mild swelling and redness of the left foot since 05-03-2018. He does drink one or two beers a day, but doesn't eat much red meat.      No Known Allergies   Current Outpatient Medications:  .  indomethacin (INDOCIN) 50 MG capsule, Take 1 capsule (50 mg total) by mouth 2 (two) times daily with a meal., Disp: 30 capsule, Rfl: 0 .  sildenafil (REVATIO) 20 MG tablet, TAKE TWO AND A HALF (2.5) TABLETS BY MOUTH 30 MINUTES BEFORE SEXUAL INTERCOURSE, Disp: 50 tablet, Rfl: 2  Review of Systems  Constitutional: Negative for appetite change, chills and fever.  Respiratory: Negative for chest tightness, shortness of breath and wheezing.   Cardiovascular: Positive for leg swelling (mild swelling of the left foot). Negative for chest pain and palpitations.  Gastrointestinal: Negative for abdominal pain, nausea and vomiting.  Musculoskeletal: Positive for arthralgias and joint swelling.    Social History   Tobacco Use  . Smoking status: Former Smoker    Packs/day: 1.00    Years: 15.00    Pack years: 15.00    Types: Cigarettes    Last attempt to quit: 12/04/1990    Years since quitting: 27.4  . Smokeless tobacco: Never Used  Substance Use Topics  . Alcohol use: Yes    Alcohol/week: 4.2 - 8.4 oz    Types: 7 - 14 Cans of beer per week   Objective:   BP 110/62 (BP Location: Right Arm,  Patient Position: Sitting, Cuff Size: Large)   Pulse 71   Temp 98.4 F (36.9 C) (Oral)   Resp 16   Wt 186 lb (84.4 kg)   SpO2 98% Comment: room air  BMI 25.94 kg/m  Vitals:   05/06/18 1039  BP: 110/62  Pulse: 71  Resp: 16  Temp: 98.4 F (36.9 C)  TempSrc: Oral  SpO2: 98%  Weight: 186 lb (84.4 kg)     Physical Exam  General appearance: alert, well developed, well nourished, cooperative and in no distress Head: Normocephalic, without obvious abnormality, atraumatic Respiratory: Respirations even and unlabored, normal respiratory rate Extremities: Red and swollen left great toe and dorsal aspect of medial forefoot c/w gout.         Assessment & Plan:     1. Pain of toe of left foot Likely gout, responded well to- indomethacin (INDOCIN) 50 MG capsule; Take 1 capsule (50 mg total) by mouth 2 (two) times daily with a meal.  Dispense: 30 capsule; Refill: 0, advised to only take as needed.  2. Gout involving toe of left foot, unspecified cause, unspecified chronicity Considering quick relapse after brief course of NSAID. Will add 30 day course of colchicine. If relapses again will add allopurinol.       Lelon Huh, MD  [facility]

## 2018-05-06 NOTE — Patient Instructions (Signed)
Call for prescriptions for allopurinol and colchicine if gout flares back up in the next month or two

## 2018-06-02 ENCOUNTER — Other Ambulatory Visit: Payer: Self-pay | Admitting: Family Medicine

## 2018-06-02 DIAGNOSIS — M109 Gout, unspecified: Secondary | ICD-10-CM

## 2018-06-19 ENCOUNTER — Telehealth: Payer: Self-pay | Admitting: Family Medicine

## 2018-06-19 NOTE — Telephone Encounter (Signed)
Pt is asking for a refill on Colchicine 0.6  [facility] [address]  Thanks teri

## 2018-06-19 NOTE — Telephone Encounter (Signed)
Please advise refill? Pharmacy did not receive refill that was sent 06/02/18. Pharmacist wanted to know if you want to leave instructions as is, or change them? [facility].

## 2018-06-20 ENCOUNTER — Other Ambulatory Visit: Payer: Self-pay | Admitting: Family Medicine

## 2018-06-20 DIAGNOSIS — M109 Gout, unspecified: Secondary | ICD-10-CM

## 2018-06-20 NOTE — Telephone Encounter (Signed)
Pt called checking on his rx for gout.  He said he never got the rx on the 30th of June  Colchicine 0.6mg   [facility] [address].  [place]

## 2018-09-11 ENCOUNTER — Other Ambulatory Visit: Payer: Self-pay | Admitting: Family Medicine

## 2018-09-11 DIAGNOSIS — M109 Gout, unspecified: Secondary | ICD-10-CM

## 2018-09-16 ENCOUNTER — Other Ambulatory Visit: Payer: Self-pay | Admitting: Family Medicine

## 2018-09-16 DIAGNOSIS — M109 Gout, unspecified: Secondary | ICD-10-CM

## 2018-09-23 DIAGNOSIS — Z1283 Encounter for screening for malignant neoplasm of skin: Secondary | ICD-10-CM | POA: Diagnosis not present

## 2018-09-23 DIAGNOSIS — L812 Freckles: Secondary | ICD-10-CM | POA: Diagnosis not present

## 2018-09-23 DIAGNOSIS — L570 Actinic keratosis: Secondary | ICD-10-CM | POA: Diagnosis not present

## 2018-09-23 DIAGNOSIS — L821 Other seborrheic keratosis: Secondary | ICD-10-CM | POA: Diagnosis not present

## 2018-09-23 DIAGNOSIS — D225 Melanocytic nevi of trunk: Secondary | ICD-10-CM | POA: Diagnosis not present

## 2018-09-23 DIAGNOSIS — L578 Other skin changes due to chronic exposure to nonionizing radiation: Secondary | ICD-10-CM | POA: Diagnosis not present

## 2018-11-19 ENCOUNTER — Ambulatory Visit (INDEPENDENT_AMBULATORY_CARE_PROVIDER_SITE_OTHER): Payer: Medicare HMO | Admitting: Family Medicine

## 2018-11-19 ENCOUNTER — Encounter: Payer: Self-pay | Admitting: Family Medicine

## 2018-11-19 VITALS — BP 136/78 | HR 68 | Temp 98.4°F | Resp 16 | Wt 186.0 lb

## 2018-11-19 DIAGNOSIS — J069 Acute upper respiratory infection, unspecified: Secondary | ICD-10-CM

## 2018-11-19 DIAGNOSIS — R059 Cough, unspecified: Secondary | ICD-10-CM

## 2018-11-19 DIAGNOSIS — R05 Cough: Secondary | ICD-10-CM

## 2018-11-19 MED ORDER — BENZONATATE 200 MG PO CAPS: 200.0000 mg | ORAL_CAPSULE | Freq: Two times a day (BID) | ORAL | 0 refills | Status: DC | PRN

## 2018-11-19 NOTE — Progress Notes (Signed)
Patient: Michael Hicks Male    DOB: 29-Oct-1946   72 y.o.   MRN: [id number] Visit Date: 11/19/2018  Today's Provider: Lelon Huh, MD   Chief Complaint  Patient presents with  . URI    Started about 10 days ago.   Subjective:     URI   This is a new problem. The current episode started in the past 7 days. The problem has been unchanged. Associated symptoms include congestion, coughing, rhinorrhea, sneezing, a sore throat and wheezing. Pertinent negatives include no abdominal pain, chest pain, ear pain, headaches, nausea, neck pain, plugged ear sensation, sinus pain, swollen glands or vomiting. He has tried decongestant for the symptoms. The treatment provided mild relief.    No Known Allergies   Current Outpatient Medications:  .  colchicine (COLCRYS) 0.6 MG tablet, TAKE 2 TABLETS BY MOUTH TODAY, THEN 1 TABLET DAILY, Disp: 30 tablet, Rfl: 5 .  sildenafil (REVATIO) 20 MG tablet, TAKE TWO AND A HALF (2.5) TABLETS BY MOUTH 30 MINUTES BEFORE SEXUAL INTERCOURSE, Disp: 50 tablet, Rfl: 2 .  indomethacin (INDOCIN) 50 MG capsule, Take 1 capsule (50 mg total) by mouth 2 (two) times daily with a meal. (Patient not taking: Reported on 11/19/2018), Disp: 30 capsule, Rfl: 0  Review of Systems  Constitutional: Positive for diaphoresis and fatigue. Negative for activity change, appetite change, chills, fever and unexpected weight change.  HENT: Positive for congestion, postnasal drip, rhinorrhea, sneezing and sore throat. Negative for ear discharge, ear pain, sinus pressure, sinus pain, tinnitus and trouble swallowing.   Eyes: Positive for discharge. Negative for photophobia, pain, redness, itching and visual disturbance.  Respiratory: Positive for cough, chest tightness and wheezing. Negative for apnea, choking, shortness of breath and stridor.   Cardiovascular: Negative for chest pain.  Gastrointestinal: Negative.  Negative for abdominal pain, nausea and vomiting.  Musculoskeletal:  Negative for neck pain and neck stiffness.  Neurological: Negative for dizziness, light-headedness and headaches.  Hematological: Negative for adenopathy.    Social History   Tobacco Use  . Smoking status: Former Smoker    Packs/day: 1.00    Years: 15.00    Pack years: 15.00    Types: Cigarettes    Last attempt to quit: 12/04/1990    Years since quitting: 27.9  . Smokeless tobacco: Never Used  Substance Use Topics  . Alcohol use: Yes    Alcohol/week: 7.0 - 14.0 standard drinks    Types: 7 - 14 Cans of beer per week      Objective:   BP 136/78 (BP Location: Right Arm, Patient Position: Sitting, Cuff Size: Normal)   Pulse 68   Temp 98.4 F (36.9 C) (Oral)   Resp 16   Wt 186 lb (84.4 kg)   BMI 25.94 kg/m  Vitals:   11/19/18 1339  BP: 136/78  Pulse: 68  Resp: 16  Temp: 98.4 F (36.9 C)  TempSrc: Oral  Weight: 186 lb (84.4 kg)    Physical Exam  General Appearance:    Alert, cooperative, no distress  HENT:   right, bilateral TM normal without fluid or infection, neck without nodes and sinuses nontender  Eyes:    PERRL, conjunctiva/corneas clear, EOM's intact       Lungs:     Clear to auscultation bilaterally, respirations unlabored  Heart:    Regular rate and rhythm  Neurologic:   Awake, alert, oriented x 3. No apparent focal neurological  defect.          Assessment & Plan    1. Cough  - benzonatate (TESSALON) 200 MG capsule; Take 1 capsule (200 mg total) by mouth 2 (two) times daily as needed for cough.  Dispense: 20 capsule; Refill: 0  2. Upper respiratory tract infection, unspecified type  - benzonatate (TESSALON) 200 MG capsule; Take 1 capsule (200 mg total) by mouth 2 (two) times daily as needed for cough.  Dispense: 20 capsule; Refill: 0  Call if symptoms change or if not rapidly improving.      Lelon Huh, MD  [facility]

## 2018-11-29 ENCOUNTER — Telehealth: Payer: Self-pay

## 2018-11-29 NOTE — Telephone Encounter (Signed)
LMTCB and schedule AWV. Only eligible from 01/15-01/21/20. -MM

## 2018-12-09 NOTE — Telephone Encounter (Signed)
Patient returned your call. Please call back at home phone number. sd

## 2018-12-09 NOTE — Telephone Encounter (Signed)
Pt wanted AWV prior to CPE so he does not have to make two trips to the clinic. Schedule AWV for 12/27/18 @ 920 and r/s CPE for 12/27/18 @ 10 with Dr Caryn Section.  -MM

## 2018-12-24 ENCOUNTER — Encounter: Payer: Medicare HMO | Admitting: Family Medicine

## 2018-12-27 ENCOUNTER — Encounter: Payer: Self-pay | Admitting: Family Medicine

## 2018-12-27 ENCOUNTER — Ambulatory Visit (INDEPENDENT_AMBULATORY_CARE_PROVIDER_SITE_OTHER): Payer: Medicare HMO

## 2018-12-27 ENCOUNTER — Ambulatory Visit (INDEPENDENT_AMBULATORY_CARE_PROVIDER_SITE_OTHER): Payer: Medicare HMO | Admitting: Family Medicine

## 2018-12-27 VITALS — BP 140/78 | HR 73 | Temp 98.8°F | Ht 71.0 in | Wt 184.8 lb

## 2018-12-27 VITALS — BP 140/78 | HR 73 | Temp 98.8°F | Ht 71.0 in | Wt 184.0 lb

## 2018-12-27 DIAGNOSIS — Z0000 Encounter for general adult medical examination without abnormal findings: Secondary | ICD-10-CM

## 2018-12-27 DIAGNOSIS — Z125 Encounter for screening for malignant neoplasm of prostate: Secondary | ICD-10-CM

## 2018-12-27 NOTE — Progress Notes (Signed)
Subjective:   Michael Hicks is a 73 y.o. male who presents for Medicare Annual/Subsequent preventive examination.  Review of Systems:  N/A  Cardiac Risk Factors include: advanced age (>4men, >43 women);male gender;sedentary lifestyle     Objective:    Vitals: BP 140/78 (BP Location: Right Arm)   Pulse 73   Temp 98.8 F (37.1 C) (Oral)   Ht 5\' 11"  (1.803 m)   Wt 184 lb 12.8 oz (83.8 kg)   BMI 25.77 kg/m   Body mass index is 25.77 kg/m.  Advanced Directives 12/27/2018 12/17/2017 07/26/2017 12/13/2016 01/14/2016 12/01/2015 06/10/2015  Does Patient Have a Medical Advance Directive? No No Yes No No Yes Yes  Type of Advance Directive - - - - - Living will;Healthcare Power of Attorney Living will  Does patient want to make changes to medical advance directive? Yes (MAU/Ambulatory/Procedural Areas - Information given) - Yes (MAU/Ambulatory/Procedural Areas - Information given) - - - -  Would patient like information on creating a medical advance directive? - Yes (MAU/Ambulatory/Procedural Areas - Information given) - Yes (MAU/Ambulatory/Procedural Areas - Information given) - - -    Tobacco Social History   Tobacco Use  Smoking Status Former Smoker  . Packs/day: 1.00  . Years: 15.00  . Pack years: 15.00  . Types: Cigarettes  . Last attempt to quit: 12/04/1990  . Years since quitting: 28.0  Smokeless Tobacco Never Used     Counseling given: Not Answered   Clinical Intake:  Pre-visit preparation completed: Yes  Pain : No/denies pain Pain Score: 0-No pain     Nutritional Status: BMI 25 -29 Overweight Diabetes: No  How often do you need to have someone help you when you read instructions, pamphlets, or other written materials from your doctor or pharmacy?: 1 - Never  Interpreter Needed?: No  Information entered by :: [facility], LPN  Past Medical History:  Diagnosis Date  . Erectile dysfunction   . Hx of colonic polyps    Past Surgical History:  Procedure  Laterality Date  . COLONOSCOPY    . COLONOSCOPY WITH PROPOFOL N/A 01/14/2016   Procedure: COLONOSCOPY WITH PROPOFOL;  Surgeon: Lucilla Lame, MD;  Location: [place];  Service: Endoscopy;  Laterality: N/A;  . COLONOSCOPY WITH PROPOFOL N/A 07/26/2017   Procedure: COLONOSCOPY WITH PROPOFOL;  Surgeon: Lucilla Lame, MD;  Location: [place];  Service: Gastroenterology;  Laterality: N/A;  . POLYPECTOMY  01/14/2016   Procedure: POLYPECTOMY;  Surgeon: Lucilla Lame, MD;  Location: [place];  Service: Endoscopy;;   Family History  Problem Relation Age of Onset  . COPD Mother   . Heart disease Mother   . Hypertension Father   . Cancer Brother        bone cancer   Social History   Socioeconomic History  . Marital status: Divorced    Spouse name: Not on file  . Number of children: 1  . Years of education: 87  . Highest education level: Associate degree: occupational, Hotel manager, or vocational program  Occupational History  . Occupation: Finance    Comment: RETIRED  Social Needs  . Financial resource strain: Not hard at all  . Food insecurity:    Worry: Never true    Inability: Never true  . Transportation needs:    Medical: No    Non-medical: No  Tobacco Use  . Smoking status: Former Smoker    Packs/day: 1.00    Years: 15.00    Pack years: 15.00    Types: Cigarettes  Last attempt to quit: 12/04/1990    Years since quitting: 28.0  . Smokeless tobacco: Never Used  Substance and Sexual Activity  . Alcohol use: Yes    Alcohol/week: 14.0 - 21.0 standard drinks    Types: 14 - 21 Cans of beer per week    Comment: 2-3 beers a night  . Drug use: No  . Sexual activity: Yes  Lifestyle  . Physical activity:    Days per week: 0 days    Minutes per session: 0 min  . Stress: Not at all  Relationships  . Social connections:    Talks on phone: Patient refused    Gets together: Patient refused    Attends religious service: Patient refused    Active member of  club or organization: Patient refused    Attends meetings of clubs or organizations: Patient refused    Relationship status: Patient refused  Other Topics Concern  . Not on file  Social History Narrative   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     Outpatient Encounter Medications as of 12/27/2018  Medication Sig  . colchicine (COLCRYS) 0.6 MG tablet TAKE 2 TABLETS BY MOUTH TODAY, THEN 1 TABLET DAILY (Patient taking differently: 0.6 mg every other day. )  . sildenafil (REVATIO) 20 MG tablet TAKE TWO AND A HALF (2.5) TABLETS BY MOUTH 30 MINUTES BEFORE SEXUAL INTERCOURSE  . benzonatate (TESSALON) 200 MG capsule Take 1 capsule (200 mg total) by mouth 2 (two) times daily as needed for cough. (Patient not taking: Reported on 12/27/2018)  . indomethacin (INDOCIN) 50 MG capsule Take 1 capsule (50 mg total) by mouth 2 (two) times daily with a meal. (Patient not taking: Reported on 11/19/2018)   No facility-administered encounter medications on file as of 12/27/2018.     Activities of Daily Living In your present state of health, do you have any difficulty performing the following activities: 12/27/2018  Hearing? N  Vision? N  Difficulty concentrating or making decisions? N  Walking or climbing stairs? N  Dressing or bathing? N  Doing errands, shopping? N  Preparing Food and eating ? N  Using the Toilet? N  In the past six months, have you accidently leaked urine? N  Do you have problems with loss of bowel control? N  Managing your Medications? N  Managing your Finances? N  Housekeeping or managing your Housekeeping? N  Some recent data might be hidden    Patient Care Team: Birdie Sons, MD as PCP - General (Family Medicine) Idelle Leech, OD as Consulting Physician (Optometry) Carmon Ginsberg, [place] as Referring Physician (Family Medicine)   Assessment:   This is a routine wellness examination for Michael Hicks.  Exercise Activities and Dietary recommendations Current Exercise  Habits: Home exercise routine, Type of exercise: stretching;walking;strength training/weights, Time (Minutes): 60, Frequency (Times/Week): 6, Weekly Exercise (Minutes/Week): 360, Intensity: Mild, Exercise limited by: None identified  Goals    . DIET - INCREASE WATER INTAKE     Recommend to drink at least 6-8 8oz glasses of water per day.    . Exercise 3x per week (30 min per time)     Starting 1/10/018, I will start back exercising 3 times a week for at least 30 minutes.   12/17/17- Recommend to start back walking 5 days a week for at least 30 minutes at a time. Pt states he plans to walk 3 miles at a time.        Fall Risk Fall Risk  12/27/2018 12/17/2017  12/13/2016 12/01/2015  Falls in the past year? 1 Yes No No  Number falls in past yr: 0 1 - -  Injury with Fall? 0 No - -  Follow up Falls prevention discussed Falls prevention discussed - -   FALL RISK PREVENTION PERTAINING TO THE HOME:  Any stairs in or around the home WITH handrails? Yes  Home free of loose throw rugs in walkways, pet beds, electrical cords, etc? No  Adequate lighting in your home to reduce risk of falls? No   ASSISTIVE DEVICES UTILIZED TO PREVENT FALLS:  Life alert? No  Use of a cane, walker or w/c? No  Grab bars in the bathroom? No  Shower chair or bench in shower? No  Elevated toilet seat or a handicapped toilet? No    TIMED UP AND GO:  Was the test performed? No .    Depression Screen PHQ 2/9 Scores 12/27/2018 12/27/2018 12/17/2017 12/17/2017  PHQ - 2 Score 0 0 0 0  PHQ- 9 Score 0 - 0 -    Cognitive Function:      6CIT Screen 12/27/2018 12/17/2017  What Year? 0 points 0 points  What month? 0 points 0 points  What time? 0 points 0 points  Count back from 20 0 points 0 points  Months in reverse 0 points 0 points  Repeat phrase 0 points 4 points  Total Score 0 4    Immunization History  Administered Date(s) Administered  . Influenza, High Dose Seasonal PF 10/20/2016, 09/28/2017, 09/03/2018    . Influenza,inj,Quad PF,6+ Mos 12/01/2015  . Influenza-Unspecified 09/28/2017  . Pneumococcal Conjugate-13 11/24/2013  . Pneumococcal Polysaccharide-23 11/25/2014  . Td 11/22/2012  . Tdap 11/22/2012  . Zoster 11/22/2012    Qualifies for Shingles Vaccine? Yes  Zostavax completed 11/22/12. Due for Shingrix. Education has been provided regarding the importance of this vaccine. Pt has been advised to call insurance company to determine out of pocket expense. Advised may also receive vaccine at local pharmacy or Health Dept. Verbalized acceptance and understanding.  Tdap: Up to date, due 11/2022  Flu Vaccine: Up to date  Pneumococcal Vaccine: Up to date   Screening Tests Health Maintenance  Topic Date Due  . COLONOSCOPY  07/26/2020  . TETANUS/TDAP  11/22/2022  . INFLUENZA VACCINE  Completed  . Hepatitis C Screening  Completed  . PNA vac Low Risk Adult  Completed   Cancer Screenings:  Colorectal Screening: Completed 07/26/17. Repeat every 3 years.  Lung Cancer Screening: (Low Dose CT Chest recommended if Age 30-80 years, 30 pack-year currently smoking OR have quit w/in 15years.) does not qualify.    Additional Screening:  Hepatitis C Screening: Up to date  Vision Screening: Recommended annual ophthalmology exams for early detection of glaucoma and other disorders of the eye.  Dental Screening: Recommended annual dental exams for proper oral hygiene  Community Resource Referral:  CRR required this visit?  No      Plan:  I have personally reviewed and addressed the Medicare Annual Wellness questionnaire and have noted the following in the patient's chart:  A. Medical and social history B. Use of alcohol, tobacco or illicit drugs  C. Current medications and supplements D. Functional ability and status E.  Nutritional status F.  Physical activity G. Advance directives H. List of other physicians I.  Hospitalizations, surgeries, and ER visits in previous 12 months J.   [place] such as hearing and vision if needed, cognitive and depression L. Referrals and appointments - none  In addition, I have reviewed and discussed with patient certain preventive protocols, quality metrics, and best practice recommendations. A written personalized care plan for preventive services as well as general preventive health recommendations were provided to patient.  See attached scanned questionnaire for additional information.   Signed,  Fabio Neighbors, LPN Nurse Health Advisor   Nurse Recommendations: None.

## 2018-12-27 NOTE — Progress Notes (Signed)
Patient: Michael Hicks, Male    DOB: [birth year], 73 y.o.   MRN: [id number] Visit Date: 12/27/2018  Today's Provider: Lelon Huh, MD   Chief Complaint  Patient presents with  . Medicare Wellness   Subjective:     Complete Physical Michael Hicks is a 73 y.o. male. He feels well. He reports exercising regularly. He reports he is sleeping well.  He was treated for gout in June and report colchicine worked well. He has been taking consistently since then, but has cut back to QOD and has not had any more gout flares.  Lab Results  Component Value Date   LABURIC 7.0 04/15/2018   -----------------------------------------------------------   Review of Systems  Constitutional: Negative.   HENT: Negative.   Eyes: Positive for discharge. Negative for photophobia, pain, redness, itching and visual disturbance.  Respiratory: Negative.   Cardiovascular: Negative.   Gastrointestinal: Negative.   Endocrine: Negative.   Genitourinary: Negative.   Musculoskeletal: Negative.   Skin: Negative.   Allergic/Immunologic: Negative.   Neurological: Negative.   Hematological: Negative.   Psychiatric/Behavioral: Negative.     Social History   Socioeconomic History  . Marital status: Divorced    Spouse name: Not on file  . Number of children: 1  . Years of education: 10  . Highest education level: Associate degree: occupational, Hotel manager, or vocational program  Occupational History  . Occupation: Finance    Comment: RETIRED  Social Needs  . Financial resource strain: Not hard at all  . Food insecurity:    Worry: Never true    Inability: Never true  . Transportation needs:    Medical: No    Non-medical: No  Tobacco Use  . Smoking status: Former Smoker    Packs/day: 1.00    Years: 15.00    Pack years: 15.00    Types: Cigarettes    Last attempt to quit: 12/04/1990    Years since quitting: 28.0  . Smokeless tobacco: Never Used  Substance and Sexual Activity  .  Alcohol use: Yes    Alcohol/week: 14.0 - 21.0 standard drinks    Types: 14 - 21 Cans of beer per week    Comment: 2-3 beers a night  . Drug use: No  . Sexual activity: Yes  Lifestyle  . Physical activity:    Days per week: 0 days    Minutes per session: 0 min  . Stress: Not at all  Relationships  . Social connections:    Talks on phone: Patient refused    Gets together: Patient refused    Attends religious service: Patient refused    Active member of club or organization: Patient refused    Attends meetings of clubs or organizations: Patient refused    Relationship status: Patient refused  . Intimate partner violence:    Fear of current or ex partner: Patient refused    Emotionally abused: Patient refused    Physically abused: Patient refused    Forced sexual activity: Patient refused  Other Topics Concern  . Not on file  Social History Narrative   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     Past Medical History:  Diagnosis Date  . Erectile dysfunction   . Hx of colonic polyps      Patient Active Problem List   Diagnosis Date Noted  . Benign neoplasm of cecum   . Polyp of sigmoid colon   . Personal history of colonic polyps   . Benign neoplasm  of sigmoid colon   . Benign neoplasm of descending colon   . Benign neoplasm of transverse colon   . Benign neoplasm of ascending colon   . Body mass index (BMI) of 25.0-25.9 in adult 06/10/2015  . Accumulation of fluid in tissues 06/10/2015  . ED (erectile dysfunction) of organic origin 06/10/2015    Past Surgical History:  Procedure Laterality Date  . COLONOSCOPY    . COLONOSCOPY WITH PROPOFOL N/A 01/14/2016   Procedure: COLONOSCOPY WITH PROPOFOL;  Surgeon: Lucilla Lame, MD;  Location: [place];  Service: Endoscopy;  Laterality: N/A;  . COLONOSCOPY WITH PROPOFOL N/A 07/26/2017   Procedure: COLONOSCOPY WITH PROPOFOL;  Surgeon: Lucilla Lame, MD;  Location: [place];  Service: Gastroenterology;   Laterality: N/A;  . POLYPECTOMY  01/14/2016   Procedure: POLYPECTOMY;  Surgeon: Lucilla Lame, MD;  Location: [place];  Service: Endoscopy;;    His family history includes COPD in his mother; Cancer in his brother; Heart disease in his mother; Hypertension in his father.      Current Outpatient Medications:  .  colchicine (COLCRYS) 0.6 MG tablet, TAKE 2 TABLETS BY MOUTH TODAY, THEN 1 TABLET DAILY (Patient taking differently: 0.6 mg every other day. ), Disp: 30 tablet, Rfl: 5 .  sildenafil (REVATIO) 20 MG tablet, TAKE TWO AND A HALF (2.5) TABLETS BY MOUTH 30 MINUTES BEFORE SEXUAL INTERCOURSE, Disp: 50 tablet, Rfl: 2 .  indomethacin (INDOCIN) 50 MG capsule, Take 1 capsule (50 mg total) by mouth 2 (two) times daily with a meal. (Patient not taking: Reported on 11/19/2018), Disp: 30 capsule, Rfl: 0  Patient Care Team: Birdie Sons, MD as PCP - General (Family Medicine) Idelle Leech, OD as Consulting Physician (Optometry) Carmon Ginsberg, [place] as Referring Physician (Family Medicine)     Objective:   Vitals: BP 140/78   Pulse 73   Temp 98.8 F (37.1 C) (Oral)   Ht 5\' 11"  (1.803 m)   Wt 184 lb (83.5 kg)   BMI 25.66 kg/m   Physical Exam   General Appearance:    Alert, cooperative, no distress, appears stated age  Head:    Normocephalic, without obvious abnormality, atraumatic  Eyes:    PERRL, conjunctiva/corneas clear, EOM's intact, fundi    benign, both eyes       Ears:    Normal TM's and external ear canals, both ears  Nose:   Nares normal, septum midline, mucosa normal, no drainage   or sinus tenderness  Throat:   Lips, mucosa, and tongue normal; teeth and gums normal  Neck:   Supple, symmetrical, trachea midline, no adenopathy;       thyroid:  No enlargement/tenderness/nodules; no carotid   bruit or JVD  Back:     Symmetric, no curvature, ROM normal, no CVA tenderness  Lungs:     Clear to auscultation bilaterally, respirations unlabored  Chest wall:    No  tenderness or deformity  Heart:    Regular rate and rhythm, S1 and S2 normal, no murmur, rub   or gallop  Abdomen:     Soft, non-tender, bowel sounds active all four quadrants,    no masses, no organomegaly  Genitalia:    deferred  Rectal:    deferred  Extremities:   Extremities normal, atraumatic, no cyanosis or edema  Pulses:   2+ and symmetric all extremities  Skin:   Skin color, texture, turgor normal, no rashes or lesions  Lymph nodes:   Cervical, supraclavicular, and axillary nodes normal  Neurologic:   CNII-XII intact. Normal strength, sensation and reflexes      throughout     Assessment & Plan:    Annual Physical Reviewed patient's Family Medical History Reviewed and updated list of patient's medical providers Assessment of cognitive impairment was done Assessed patient's functional ability Established a written schedule for health screening [place] Completed and Reviewed  Exercise Activities and Dietary recommendations Goals    . DIET - INCREASE WATER INTAKE     Recommend to drink at least 6-8 8oz glasses of water per day.    . Exercise 3x per week (30 min per time)     Starting 1/10/018, I will start back exercising 3 times a week for at least 30 minutes.   12/17/17- Recommend to start back walking 5 days a week for at least 30 minutes at a time. Pt states he plans to walk 3 miles at a time.        Immunization History  Administered Date(s) Administered  . Influenza, High Dose Seasonal PF 10/20/2016, 09/28/2017, 09/03/2018  . Influenza,inj,Quad PF,6+ Mos 12/01/2015  . Influenza-Unspecified 09/28/2017  . Pneumococcal Conjugate-13 11/24/2013  . Pneumococcal Polysaccharide-23 11/25/2014  . Td 11/22/2012  . Tdap 11/22/2012  . Zoster 11/22/2012    Health Maintenance  Topic Date Due  . COLONOSCOPY  07/26/2020  . TETANUS/TDAP  11/22/2022  . INFLUENZA VACCINE  Completed  . Hepatitis C Screening  Completed  . PNA vac Low Risk Adult   Completed     Discussed health benefits of physical activity, and encouraged him to engage in regular exercise appropriate for his age and condition.    ------------------------------------------------------------------------------------------------------------  1. Annual physical exam  - Basic metabolic panel  2. Prostate cancer screening  - PSA    Lelon Huh, MD  [facility]

## 2018-12-27 NOTE — Patient Instructions (Addendum)
.   Please review the attached list of medications and notify my office if there are any errors.   . Please bring all of your medications to every appointment so we can make sure that our medication list is the same as yours.    The CDC recommends two doses of Shingrix (the shingles vaccine) separated by 2 to 6 months for adults age 71 years and older. I recommend checking with your insurance plan regarding coverage for this vaccine.   

## 2018-12-27 NOTE — Patient Instructions (Signed)
Michael Hicks , Thank you for taking time to come for your Medicare Wellness Visit. I appreciate your ongoing commitment to your health goals. Please review the following plan we discussed and let me know if I can assist you in the future.   Screening recommendations/referrals: Colonoscopy: Up to date, due 07/2020 Recommended yearly ophthalmology/optometry visit for glaucoma screening and checkup Recommended yearly dental visit for hygiene and checkup  Vaccinations: Influenza vaccine: Up to date Pneumococcal vaccine: Completed series Tdap vaccine: Up to date, due 11/2022 Shingles vaccine: Pt declines today.     Advanced directives: Advance directive discussed with you today. I have provided a copy for you to complete at home and have notarized. Once this is complete please bring a copy in to our office so we can scan it into your chart.  Conditions/risks identified: Fall risk prevention; Recommend to drink at least 6-8 8oz glasses of water per day.  Next appointment: 10:00 AM today with Dr Caryn Section.   Preventive Care 37 Years and Older, Male Preventive care refers to lifestyle choices and visits with your health care provider that can promote health and wellness. What does preventive care include?  A yearly physical exam. This is also called an annual well check.  Dental exams once or twice a year.  Routine eye exams. Ask your health care provider how often you should have your eyes checked.  Personal lifestyle choices, including:  Daily care of your teeth and gums.  Regular physical activity.  Eating a healthy diet.  Avoiding tobacco and drug use.  Limiting alcohol use.  Practicing safe sex.  Taking low doses of aspirin every day.  Taking vitamin and mineral supplements as recommended by your health care provider. What happens during an annual well check? The services and screenings done by your health care provider during your annual well check will depend on your age,  overall health, lifestyle risk factors, and family history of disease. Counseling  Your health care provider may ask you questions about your:  Alcohol use.  Tobacco use.  Drug use.  Emotional well-being.  Home and relationship well-being.  Sexual activity.  Eating habits.  History of falls.  Memory and ability to understand (cognition).  Work and work Statistician. Screening  You may have the following tests or measurements:  Height, weight, and BMI.  Blood pressure.  Lipid and cholesterol levels. These may be checked every 5 years, or more frequently if you are over 108 years old.  Skin check.  Lung cancer screening. You may have this screening every year starting at age 71 if you have a 30-pack-year history of smoking and currently smoke or have quit within the past 15 years.  Fecal occult blood test (FOBT) of the stool. You may have this test every year starting at age 71.  Flexible sigmoidoscopy or colonoscopy. You may have a sigmoidoscopy every 5 years or a colonoscopy every 10 years starting at age 71.  Prostate cancer screening. Recommendations will vary depending on your family history and other risks.  Hepatitis C blood test.  Hepatitis B blood test.  Sexually transmitted disease (STD) testing.  Diabetes screening. This is done by checking your blood sugar (glucose) after you have not eaten for a while (fasting). You may have this done every 1-3 years.  Abdominal aortic aneurysm (AAA) screening. You may need this if you are a current or former smoker.  Osteoporosis. You may be screened starting at age 78 if you are at high risk. Talk with your  health care provider about your test results, treatment options, and if necessary, the need for more tests. Vaccines  Your health care provider may recommend certain vaccines, such as:  Influenza vaccine. This is recommended every year.  Tetanus, diphtheria, and acellular pertussis (Tdap, Td) vaccine. You may  need a Td booster every 10 years.  Zoster vaccine. You may need this after age 45.  Pneumococcal 13-valent conjugate (PCV13) vaccine. One dose is recommended after age 18.  Pneumococcal polysaccharide (PPSV23) vaccine. One dose is recommended after age 71. Talk to your health care provider about which screenings and vaccines you need and how often you need them. This information is not intended to replace advice given to you by your health care provider. Make sure you discuss any questions you have with your health care provider. Document Released: 12/17/2015 Document Revised: 08/09/2016 Document Reviewed: 09/21/2015 Elsevier Interactive Patient Education  2017 [place] Prevention in the Home Falls can cause injuries. They can happen to people of all ages. There are many things you can do to make your home safe and to help prevent falls. What can I do on the outside of my home?  Regularly fix the edges of walkways and driveways and fix any cracks.  Remove anything that might make you trip as you walk through a door, such as a raised step or threshold.  Trim any bushes or trees on the path to your home.  Use bright outdoor lighting.  Clear any walking paths of anything that might make someone trip, such as rocks or tools.  Regularly check to see if handrails are loose or broken. Make sure that both sides of any steps have handrails.  Any raised decks and porches should have guardrails on the edges.  Have any leaves, snow, or ice cleared regularly.  Use sand or salt on walking paths during winter.  Clean up any spills in your garage right away. This includes oil or grease spills. What can I do in the bathroom?  Use night lights.  Install grab bars by the toilet and in the tub and shower. Do not use towel bars as grab bars.  Use non-skid mats or decals in the tub or shower.  If you need to sit down in the shower, use a plastic, non-slip stool.  Keep the floor  dry. Clean up any water that spills on the floor as soon as it happens.  Remove soap buildup in the tub or shower regularly.  Attach bath mats securely with double-sided non-slip rug tape.  Do not have throw rugs and other things on the floor that can make you trip. What can I do in the bedroom?  Use night lights.  Make sure that you have a light by your bed that is easy to reach.  Do not use any sheets or blankets that are too big for your bed. They should not hang down onto the floor.  Have a firm chair that has side arms. You can use this for support while you get dressed.  Do not have throw rugs and other things on the floor that can make you trip. What can I do in the kitchen?  Clean up any spills right away.  Avoid walking on wet floors.  Keep items that you use a lot in easy-to-reach places.  If you need to reach something above you, use a strong step stool that has a grab bar.  Keep electrical cords out of the way.  Do not use  floor polish or wax that makes floors slippery. If you must use wax, use non-skid floor wax.  Do not have throw rugs and other things on the floor that can make you trip. What can I do with my stairs?  Do not leave any items on the stairs.  Make sure that there are handrails on both sides of the stairs and use them. Fix handrails that are broken or loose. Make sure that handrails are as long as the stairways.  Check any carpeting to make sure that it is firmly attached to the stairs. Fix any carpet that is loose or worn.  Avoid having throw rugs at the top or bottom of the stairs. If you do have throw rugs, attach them to the floor with carpet tape.  Make sure that you have a light switch at the top of the stairs and the bottom of the stairs. If you do not have them, ask someone to add them for you. What else can I do to help prevent falls?  Wear shoes that:  Do not have high heels.  Have rubber bottoms.  Are comfortable and fit you  well.  Are closed at the toe. Do not wear sandals.  If you use a stepladder:  Make sure that it is fully opened. Do not climb a closed stepladder.  Make sure that both sides of the stepladder are locked into place.  Ask someone to hold it for you, if possible.  Clearly mark and make sure that you can see:  Any grab bars or handrails.  First and last steps.  Where the edge of each step is.  Use tools that help you move around (mobility aids) if they are needed. These include:  Canes.  Walkers.  Scooters.  Crutches.  Turn on the lights when you go into a dark area. Replace any light bulbs as soon as they burn out.  Set up your furniture so you have a clear path. Avoid moving your furniture around.  If any of your floors are uneven, fix them.  If there are any pets around you, be aware of where they are.  Review your medicines with your doctor. Some medicines can make you feel dizzy. This can increase your chance of falling. Ask your doctor what other things that you can do to help prevent falls. This information is not intended to replace advice given to you by your health care provider. Make sure you discuss any questions you have with your health care provider. Document Released: 09/16/2009 Document Revised: 04/27/2016 Document Reviewed: 12/25/2014 Elsevier Interactive Patient Education  2017 [employer].

## 2018-12-28 LAB — PSA: PROSTATE SPECIFIC AG, SERUM: 1.6 ng/mL (ref 0.0–4.0)

## 2018-12-28 LAB — BASIC METABOLIC PANEL
BUN / CREAT RATIO: 19 (ref 10–24)
BUN: 18 mg/dL (ref 8–27)
CHLORIDE: 97 mmol/L (ref 96–106)
CO2: 23 mmol/L (ref 20–29)
Calcium: 9.5 mg/dL (ref 8.6–10.2)
Creatinine, Ser: 0.95 mg/dL (ref 0.76–1.27)
GFR, EST AFRICAN AMERICAN: 93 mL/min/{1.73_m2} (ref >59)
GFR, EST NON AFRICAN AMERICAN: 81 mL/min/{1.73_m2} (ref >59)
Glucose: 91 mg/dL (ref 65–99)
POTASSIUM: 4.6 mmol/L (ref 3.5–5.2)
SODIUM: 138 mmol/L (ref 134–144)

## 2018-12-30 ENCOUNTER — Telehealth: Payer: Self-pay

## 2018-12-30 NOTE — Telephone Encounter (Signed)
Pt advised.   Thanks,   -Janiqua Friscia  

## 2018-12-30 NOTE — Telephone Encounter (Signed)
-----   Message from Birdie Sons, MD sent at 12/28/2018  9:52 PM EST ----- , blood sugar, kidney functions, electrolytes and cholesterol are all normal. Check labs yearly.

## 2018-12-30 NOTE — Telephone Encounter (Signed)
LMTCB 12/30/2018  Thanks,   -Mickel Baas

## 2019-01-01 DIAGNOSIS — H5213 Myopia, bilateral: Secondary | ICD-10-CM | POA: Diagnosis not present

## 2019-01-08 ENCOUNTER — Telehealth: Payer: Self-pay | Admitting: Family Medicine

## 2019-01-08 NOTE — Telephone Encounter (Signed)
Form is complete. Please fax.

## 2019-01-08 NOTE — Telephone Encounter (Signed)
Michael Hicks dropped off medical clearence form for hiking that needs to be filed out and faxed to [phone] when complete.

## 2019-01-08 NOTE — Telephone Encounter (Signed)
Form has been faxed.   Thanks,   -Mickel Baas

## 2019-04-25 ENCOUNTER — Ambulatory Visit (INDEPENDENT_AMBULATORY_CARE_PROVIDER_SITE_OTHER): Payer: Medicare HMO | Admitting: Physician Assistant

## 2019-04-25 ENCOUNTER — Encounter: Payer: Self-pay | Admitting: Physician Assistant

## 2019-04-25 DIAGNOSIS — W57XXXA Bitten or stung by nonvenomous insect and other nonvenomous arthropods, initial encounter: Secondary | ICD-10-CM

## 2019-04-25 DIAGNOSIS — L539 Erythematous condition, unspecified: Secondary | ICD-10-CM | POA: Diagnosis not present

## 2019-04-25 DIAGNOSIS — R609 Edema, unspecified: Secondary | ICD-10-CM | POA: Diagnosis not present

## 2019-04-25 MED ORDER — DOXYCYCLINE HYCLATE 100 MG PO TABS: 100.0000 mg | ORAL_TABLET | Freq: Two times a day (BID) | ORAL | 0 refills | Status: AC

## 2019-04-25 NOTE — Progress Notes (Signed)
Patient: Michael Hicks Male    DOB: 1946-02-15   73 y.o.   MRN: [id number] Visit Date: 04/25/2019  Today's Provider: Trinna Post, PA-C   Chief Complaint  Patient presents with  . Tick bite   Subjective:    Virtual Visit via Telephone Note  I connected with Rosine Beat Piech on 04/25/19 at  2:20 PM EDT by telephone and verified that I am speaking with the correct person using two identifiers.  Location: Patient: Home Provider: Office   I discussed the limitations, risks, security and privacy concerns of performing an evaluation and management service by telephone and the availability of in person appointments. I also discussed with the patient that there may be a patient responsible charge related to this service. The patient expressed understanding and agreed to proceed.  HPI   Tick Bite:  Patient presents today for a tick bite on right inner upper thigh. The bite occurred on Monday or Tuesday. Tick was on for <24 hours. The area is red and swollen.  He has been using insect bite cream with mild relief. Reports the area is in the inside of his groin. Reports the area is 5 inches in diameter and red, the area has increased in size. [place] tick bites before, usually does swell up. Put some triamcinolone cream last night and feels the area has improved. Denies fevers, chills, nausea, vomiting, rash elsewhere. Denies pus coming from the lesion.   No Known Allergies   Current Outpatient Medications:  .  sildenafil (REVATIO) 20 MG tablet, TAKE TWO AND A HALF (2.5) TABLETS BY MOUTH 30 MINUTES BEFORE SEXUAL INTERCOURSE, Disp: 50 tablet, Rfl: 2 .  colchicine (COLCRYS) 0.6 MG tablet, TAKE 2 TABLETS BY MOUTH TODAY, THEN 1 TABLET DAILY (Patient not taking: Reported on 04/25/2019), Disp: 30 tablet, Rfl: 5 .  indomethacin (INDOCIN) 50 MG capsule, Take 1 capsule (50 mg total) by mouth 2 (two) times daily with a meal. (Patient not taking: Reported  on 04/25/2019), Disp: 30 capsule, Rfl: 0  Review of Systems  Constitutional: Negative.   Respiratory: Negative.   Cardiovascular: Negative.   Musculoskeletal: Negative.   Skin:       Tick bite     Social History   Tobacco Use  . Smoking status: Former Smoker    Packs/day: 1.00    Years: 15.00    Pack years: 15.00    Types: Cigarettes    Last attempt to quit: 12/04/1990    Years since quitting: 28.4  . Smokeless tobacco: Never Used  Substance Use Topics  . Alcohol use: Yes    Alcohol/week: 14.0 - 21.0 standard drinks    Types: 14 - 21 Cans of beer per week    Comment: 2-3 beers a night      Objective:   There were no vitals taken for this visit. There were no vitals filed for this visit.   Physical Exam      Assessment & Plan    1. Tick bite, initial encounter  Sounds like large localized reaction. He may continue to put triamcinolone cream to the area but I will send in doxycycline for if the area does not improve, gets redder or more swollen.   - doxycycline (VIBRA-TABS) 100 MG tablet; Take 1 tablet (100 mg total) by mouth 2 (two) times daily for 7 days.  Dispense: 14 tablet; Refill: 0  2. Redness  - doxycycline (VIBRA-TABS)  100 MG tablet; Take 1 tablet (100 mg total) by mouth 2 (two) times daily for 7 days.  Dispense: 14 tablet; Refill: 0  3. Swollen  - doxycycline (VIBRA-TABS) 100 MG tablet; Take 1 tablet (100 mg total) by mouth 2 (two) times daily for 7 days.  Dispense: 14 tablet; Refill: 0     I discussed the assessment and treatment plan with the patient. The patient was provided an opportunity to ask questions and all were answered. The patient agreed with the plan and demonstrated an understanding of the instructions.   The patient was advised to call back or seek an in-person evaluation if the symptoms worsen or if the condition fails to improve as anticipated.  The entirety of the information documented in the History of Present Illness, Review of  Systems and Physical Exam were personally obtained by me. Portions of this information were initially documented by Idelle Jo, CMA and reviewed by me for thoroughness and accuracy.   F/u PRN   Trinna Post, PA-C  [facility]

## 2019-04-25 NOTE — Patient Instructions (Signed)

## 2019-09-30 DIAGNOSIS — L814 Other melanin hyperpigmentation: Secondary | ICD-10-CM | POA: Diagnosis not present

## 2019-09-30 DIAGNOSIS — L570 Actinic keratosis: Secondary | ICD-10-CM | POA: Diagnosis not present

## 2019-09-30 DIAGNOSIS — L821 Other seborrheic keratosis: Secondary | ICD-10-CM | POA: Diagnosis not present

## 2019-09-30 DIAGNOSIS — L578 Other skin changes due to chronic exposure to nonionizing radiation: Secondary | ICD-10-CM | POA: Diagnosis not present

## 2019-09-30 DIAGNOSIS — Z1283 Encounter for screening for malignant neoplasm of skin: Secondary | ICD-10-CM | POA: Diagnosis not present

## 2019-11-10 ENCOUNTER — Ambulatory Visit: Payer: Self-pay

## 2019-11-10 DIAGNOSIS — R0602 Shortness of breath: Secondary | ICD-10-CM

## 2019-11-10 NOTE — Telephone Encounter (Signed)
From [facility] 

## 2019-11-10 NOTE — Addendum Note (Signed)
Addended by: Jules Schick on: 11/10/2019 01:39 PM   Modules accepted: Orders

## 2019-11-10 NOTE — Telephone Encounter (Signed)
Pt. Reports he has noticed shortness of breath when he is hiking or walking for exercise x 1 month. No cough. Reports this unusual for him. Virtual visit made for tomorrow. No availability with his PCP.  Reason for Disposition . [1] MODERATE longstanding difficulty breathing (e.g., speaks in phrases, SOB even at rest, pulse 100-120) AND [2] SAME as normal  Answer Assessment - Initial Assessment Questions 1. RESPIRATORY STATUS: "Describe your breathing?" (e.g., wheezing, shortness of breath, unable to speak, severe coughing)      Shortness of breath 2. ONSET: "When did this breathing problem begin?"      1 month ago 3. PATTERN "Does the difficult breathing come and go, or has it been constant since it started?"      Comes and goes 4. SEVERITY: "How bad is your breathing?" (e.g., mild, moderate, severe)    - MILD: No SOB at rest, mild SOB with walking, speaks normally in sentences, can lay down, no retractions, pulse < 100.    - MODERATE: SOB at rest, SOB with minimal exertion and prefers to sit, cannot lie down flat, speaks in phrases, mild retractions, audible wheezing, pulse 100-120.    - SEVERE: Very SOB at rest, speaks in single words, struggling to breathe, sitting hunched forward, retractions, pulse > 120      Mild 5. RECURRENT SYMPTOM: "Have you had difficulty breathing before?" If so, ask: "When was the last time?" and "What happened that time?"      No 6. CARDIAC HISTORY: "Do you have any history of heart disease?" (e.g., heart attack, angina, bypass surgery, angioplasty)      No 7. LUNG HISTORY: "Do you have any history of lung disease?"  (e.g., pulmonary embolus, asthma, emphysema)     No 8. CAUSE: "What do you think is causing the breathing problem?"      Unsure 9. OTHER SYMPTOMS: "Do you have any other symptoms? (e.g., dizziness, runny nose, cough, chest pain, fever)     No 10. PREGNANCY: "Is there any chance you are pregnant?" "When was your last menstrual period?"        n/a 11. TRAVEL: "Have you traveled out of the country in the last month?" (e.g., travel history, exposures)       No  Protocols used: BREATHING DIFFICULTY-A-AH

## 2019-11-10 NOTE — Telephone Encounter (Signed)
Make that at the end of the Wednesday morning schedule

## 2019-11-10 NOTE — Addendum Note (Signed)
Addended by: Birdie Sons on: 11/10/2019 12:54 PM   Modules accepted: Orders

## 2019-11-10 NOTE — Telephone Encounter (Addendum)
Shortness of breath cannot be worked up over the phone. He will need labs oximetry, and an EKG, maybe and chest XR.   Please have him do a Covid test today and schedule in-office visit on Thursday. Will see him in the office on Thursday if the Covid test is negative.   Goo to ER if breathing gets worse between now and then.

## 2019-11-10 NOTE — Telephone Encounter (Signed)
Patient advised. He verbalized understanding. Appointment for 12/8 canceled and rescheduled for 12/9 at 11:20 with Dr. Caryn Section if COVID test is negative.

## 2019-11-11 ENCOUNTER — Other Ambulatory Visit: Payer: Self-pay

## 2019-11-11 ENCOUNTER — Telehealth: Payer: Self-pay

## 2019-11-11 ENCOUNTER — Telehealth: Payer: Self-pay | Admitting: Physician Assistant

## 2019-11-11 DIAGNOSIS — Z20822 Contact with and (suspected) exposure to covid-19: Secondary | ICD-10-CM

## 2019-11-11 NOTE — Telephone Encounter (Signed)
Copied from [place] [id number]. Topic: General - Other >> Nov 11, 2019 12:44 PM Lennox Solders wrote: Reason for CRM:pt just had covid test today and he is schedule with dr Caryn Section tomorrow. Please advise

## 2019-11-11 NOTE — Telephone Encounter (Signed)
He needs to have a negative covid test before coming into the office. The covid test usually takes 2 days to come back so he will probably not be able to come in tomorrow.  If the result is not complete in the morning we will call him to reschedule the appointment for later in the week.

## 2019-11-12 ENCOUNTER — Telehealth: Payer: Self-pay | Admitting: *Deleted

## 2019-11-12 ENCOUNTER — Telehealth: Payer: Self-pay | Admitting: Physician Assistant

## 2019-11-12 ENCOUNTER — Telehealth: Payer: Self-pay | Admitting: Family Medicine

## 2019-11-12 ENCOUNTER — Ambulatory Visit: Payer: Self-pay | Admitting: Family Medicine

## 2019-11-12 LAB — NOVEL CORONAVIRUS, NAA: SARS-CoV-2, NAA: NOT DETECTED

## 2019-11-12 NOTE — Telephone Encounter (Signed)
Patient called for results ,still pending . 

## 2019-11-12 NOTE — Telephone Encounter (Signed)
Appointment rescheduled for Friday 11/14/2019 at 10:40am.

## 2019-11-12 NOTE — Telephone Encounter (Signed)
Patient needs to reschedule today's visit. He cannot come to office until the covid test is complete due to shortness of breath. He was supposed to have it done on Monday, be he didn't go until yesterday and it takes 2 days to get a result.  He can be double booked on my schedule Friday.

## 2019-11-13 ENCOUNTER — Telehealth: Payer: Self-pay

## 2019-11-13 NOTE — Telephone Encounter (Signed)
Caller given negative result and verbalized understanding  

## 2019-11-14 ENCOUNTER — Other Ambulatory Visit: Payer: Self-pay

## 2019-11-14 ENCOUNTER — Ambulatory Visit
Admission: RE | Admit: 2019-11-14 | Discharge: 2019-11-14 | Disposition: A | Discharge status: Home / Self Care | Payer: Medicare HMO | Source: Ambulatory Visit | Attending: Family Medicine | Admitting: Family Medicine

## 2019-11-14 ENCOUNTER — Encounter: Payer: Self-pay | Admitting: Family Medicine

## 2019-11-14 ENCOUNTER — Ambulatory Visit (INDEPENDENT_AMBULATORY_CARE_PROVIDER_SITE_OTHER): Payer: Medicare HMO | Admitting: Family Medicine

## 2019-11-14 VITALS — BP 132/78 | HR 75 | Temp 97.7°F | Resp 16 | Wt 190.0 lb

## 2019-11-14 DIAGNOSIS — R0602 Shortness of breath: Secondary | ICD-10-CM | POA: Diagnosis not present

## 2019-11-14 DIAGNOSIS — R0609 Other forms of dyspnea: Secondary | ICD-10-CM | POA: Diagnosis not present

## 2019-11-14 DIAGNOSIS — R5383 Other fatigue: Secondary | ICD-10-CM | POA: Diagnosis not present

## 2019-11-14 NOTE — Patient Instructions (Addendum)
.   Please review the attached list of medications and notify my office if there are any errors.   . Please bring all of your medications to every appointment so we can make sure that our medication list is the same as yours.   Please go to the lab draw station in [address] on the second floor of [facility] . Normal hours are 8:00am to 12:30pm and 1:30pm to 4:00pm Monday through Friday  Go to the [facility] on [facility] for chest Xray

## 2019-11-14 NOTE — Progress Notes (Signed)
Patient: Michael Hicks Male    DOB: June 02, 1946   73 y.o.   MRN: [id number] Visit Date: 11/14/2019  Today's Provider: Lelon Huh, MD   Chief Complaint  Patient presents with  . Shortness of Breath   Subjective:     Shortness of Breath This is a new problem. Episode onset: approximately 1-2 months ago. The problem occurs intermittently. The problem has been unchanged. Pertinent negatives include no abdominal pain, chest pain, fever, vomiting or wheezing. Exacerbated by: exertion and climbing stairs or hiking. He has tried rest for the symptoms. The treatment provided moderate relief.  No chest pains or palpations. He does drink 2-3 beers every day. Stopped smoking in the 1990s. Denies PND and orthopnea.   No Known Allergies   Current Outpatient Medications:  .  colchicine (COLCRYS) 0.6 MG tablet, TAKE 2 TABLETS BY MOUTH TODAY, THEN 1 TABLET DAILY, Disp: 30 tablet, Rfl: 5 .  indomethacin (INDOCIN) 50 MG capsule, Take 1 capsule (50 mg total) by mouth 2 (two) times daily with a meal. (Patient not taking: Reported on 11/14/2019), Disp: 30 capsule, Rfl: 0 .  sildenafil (REVATIO) 20 MG tablet, TAKE TWO AND A HALF (2.5) TABLETS BY MOUTH 30 MINUTES BEFORE SEXUAL INTERCOURSE (Patient not taking: Reported on 11/14/2019), Disp: 50 tablet, Rfl: 2  Review of Systems  Constitutional: Negative.  Negative for appetite change, chills and fever.  Respiratory: Positive for shortness of breath (during exertion). Negative for chest tightness and wheezing.   Cardiovascular: Negative.  Negative for chest pain and palpitations.  Gastrointestinal: Negative for abdominal pain, nausea and vomiting.  Musculoskeletal: Positive for arthralgias (right shoulder).    Social History   Tobacco Use  . Smoking status: Former Smoker    Packs/day: 1.00    Years: 15.00    Pack years: 15.00    Types: Cigarettes    Quit date: 12/04/1990    Years since quitting: 28.9  . Smokeless tobacco: Never Used    Substance Use Topics  . Alcohol use: Yes    Alcohol/week: 14.0 - 21.0 standard drinks    Types: 14 - 21 Cans of beer per week    Comment: 2-3 beers a night      Objective:   BP 132/78 (BP Location: Right Arm, Cuff Size: Large)   Pulse 75   Temp 97.7 F (36.5 C) (Temporal)   Resp 16   Wt 190 lb (86.2 kg)   SpO2 99% Comment: room air  BMI 26.50 kg/m  Vitals:   11/14/19 1047 11/14/19 1049  BP: 140/80 132/78  Pulse: 75   Resp: 16   Temp: 97.7 F (36.5 C)   TempSrc: Temporal   SpO2: 99%   Weight: 190 lb (86.2 kg)   Body mass index is 26.5 kg/m.   Physical Exam   General Appearance:    Well developed, well nourished male in no acute distress  Eyes:    PERRL, conjunctiva/corneas clear, EOM's intact       Lungs:     Clear to auscultation bilaterally, respirations unlabored  Heart:    Normal heart rate. Normal rhythm. No murmurs, rubs, or gallops.   MS:   All extremities are intact. Tace pedal edema  Neurologic:   Awake, alert, oriented x 3. No apparent focal neurological           defect.      EKG: NSR      Assessment & Plan    1. Shortness of breath  -  EKG 12-Lead - Comprehensive metabolic panel - B Nat Peptide - CBC - DG Chest 2 View; Future  2. Fatigue, unspecified type  - Comprehensive metabolic panel - CBC - TSH   The entirety of the information documented in the History of Present Illness, Review of Systems and Physical Exam were personally obtained by me. Portions of this information were initially documented by Meyer Cory, CMA and reviewed by me for thoroughness and accuracy.      Lelon Huh, MD  [facility]

## 2019-11-15 LAB — COMPREHENSIVE METABOLIC PANEL
ALT: 17 IU/L (ref 0–44)
AST: 23 IU/L (ref 0–40)
Albumin/Globulin Ratio: 1.6 (ref 1.2–2.2)
Albumin: 4.1 g/dL (ref 3.7–4.7)
Alkaline Phosphatase: 100 IU/L (ref 39–117)
BUN/Creatinine Ratio: 29 — ABNORMAL HIGH (ref 10–24)
BUN: 26 mg/dL (ref 8–27)
Bilirubin Total: 0.4 mg/dL (ref 0.0–1.2)
CO2: 21 mmol/L (ref 20–29)
Calcium: 9 mg/dL (ref 8.6–10.2)
Chloride: 103 mmol/L (ref 96–106)
Creatinine, Ser: 0.9 mg/dL (ref 0.76–1.27)
GFR calc Af Amer: 99 mL/min/{1.73_m2} (ref >59)
GFR calc non Af Amer: 86 mL/min/{1.73_m2} (ref >59)
Globulin, Total: 2.5 g/dL (ref 1.5–4.5)
Glucose: 103 mg/dL — ABNORMAL HIGH (ref 65–99)
Potassium: 4.3 mmol/L (ref 3.5–5.2)
Sodium: 141 mmol/L (ref 134–144)
Total Protein: 6.6 g/dL (ref 6.0–8.5)

## 2019-11-15 LAB — CBC
Hematocrit: 39.8 % (ref 37.5–51.0)
Hemoglobin: 13.8 g/dL (ref 13.0–17.7)
MCH: 34.9 pg — ABNORMAL HIGH (ref 26.6–33.0)
MCHC: 34.7 g/dL (ref 31.5–35.7)
MCV: 101 fL — ABNORMAL HIGH (ref 79–97)
Platelets: 187 10*3/uL (ref 150–450)
RBC: 3.95 x10E6/uL — ABNORMAL LOW (ref 4.14–5.80)
RDW: 11.7 % (ref 11.6–15.4)
WBC: 7 10*3/uL (ref 3.4–10.8)

## 2019-11-15 LAB — BRAIN NATRIURETIC PEPTIDE: BNP: 27.1 pg/mL (ref 0.0–100.0)

## 2019-11-15 LAB — TSH: TSH: 1.71 u[IU]/mL (ref 0.450–4.500)

## 2019-11-17 ENCOUNTER — Telehealth: Payer: Self-pay

## 2019-11-17 NOTE — Telephone Encounter (Signed)
-----   Message from Birdie Sons, MD sent at 11/15/2019  7:28 PM EST ----- All labs and xrays are normal. He could have come COPD, but we can't do any spirometry right now due to covid. I would suggest trying samples of Symbicort 160 2 puffs twice a day. He can have 2 inhalers and follow up in a month.

## 2019-11-17 NOTE — Telephone Encounter (Signed)
Tried calling patient. Left message to call back. Okay for [facility] triage nurse to advise patient. Please send message back to office if patient agrees to try samples.

## 2019-11-17 NOTE — Telephone Encounter (Signed)
Patient was advised and samples have been left at desk for pick up. KW

## 2019-11-17 NOTE — Telephone Encounter (Signed)
Pt returned call to the office. Attempted to transfer call to NT but there was no nurse available. Pt requests call back.

## 2019-12-30 NOTE — Progress Notes (Signed)
Subjective:   Michael Hicks is a 74 y.o. male who presents for Medicare Annual/Subsequent preventive examination.    This visit is being conducted through telemedicine due to the COVID-19 pandemic. This patient has given me verbal consent via doximity to conduct this visit, patient states they are participating from their home address. Some vital signs may be absent or patient reported.    Patient identification: identified by name, DOB, and current address  Review of Systems:  N/A  Cardiac Risk Factors include: advanced age (>46men, >65 women);male gender     Objective:    Vitals: There were no vitals taken for this visit.  There is no height or weight on file to calculate BMI. Unable to obtain vitals due to visit being conducted via telephonically.   Advanced Directives 12/31/2019 12/27/2018 12/17/2017 07/26/2017 12/13/2016 01/14/2016 12/01/2015  Does Patient Have a Medical Advance Directive? Yes No No Yes No No Yes  Type of Paramedic of [place];Living will - - - - - Living will;Healthcare Power of Attorney  Does patient want to make changes to medical advance directive? - Yes (MAU/Ambulatory/Procedural Areas - Information given) - Yes (MAU/Ambulatory/Procedural Areas - Information given) - - -  Copy of [place] in Chart? No - copy requested - - - - - -  Would patient like information on creating a medical advance directive? - - Yes (MAU/Ambulatory/Procedural Areas - Information given) - Yes (MAU/Ambulatory/Procedural Areas - Information given) - -    Tobacco Social History   Tobacco Use  Smoking Status Former Smoker  . Packs/day: 1.00  . Years: 15.00  . Pack years: 15.00  . Types: Cigarettes  . Quit date: 12/04/1990  . Years since quitting: 29.0  Smokeless Tobacco Never Used     Counseling given: Not Answered   Clinical Intake:  Pre-visit preparation completed: Yes  Pain : No/denies pain Pain Score: 0-No pain      Nutritional Risks: None Diabetes: No  How often do you need to have someone help you when you read instructions, pamphlets, or other written materials from your doctor or pharmacy?: 1 - Never  Interpreter Needed?: No  Information entered by :: [facility], LPN  Past Medical History:  Diagnosis Date  . Erectile dysfunction   . Gout   . Hx of colonic polyps   . SOB (shortness of breath)    Past Surgical History:  Procedure Laterality Date  . COLONOSCOPY    . COLONOSCOPY WITH PROPOFOL N/A 01/14/2016   Procedure: COLONOSCOPY WITH PROPOFOL;  Surgeon: Lucilla Lame, MD;  Location: [place];  Service: Endoscopy;  Laterality: N/A;  . COLONOSCOPY WITH PROPOFOL N/A 07/26/2017   Procedure: COLONOSCOPY WITH PROPOFOL;  Surgeon: Lucilla Lame, MD;  Location: [place];  Service: Gastroenterology;  Laterality: N/A;  . POLYPECTOMY  01/14/2016   Procedure: POLYPECTOMY;  Surgeon: Lucilla Lame, MD;  Location: [place];  Service: Endoscopy;;   Family History  Problem Relation Age of Onset  . COPD Mother   . Heart disease Mother   . Hypertension Father   . Cancer Brother        bone cancer   Social History   Socioeconomic History  . Marital status: Divorced    Spouse name: Not on file  . Number of children: 1  . Years of education: 68  . Highest education level: Associate degree: occupational, Hotel manager, or vocational program  Occupational History  . Occupation: Finance    Comment: RETIRED  Tobacco  Use  . Smoking status: Former Smoker    Packs/day: 1.00    Years: 15.00    Pack years: 15.00    Types: Cigarettes    Quit date: 12/04/1990    Years since quitting: 29.0  . Smokeless tobacco: Never Used  Substance and Sexual Activity  . Alcohol use: Yes    Alcohol/week: 14.0 - 21.0 standard drinks    Types: 14 - 21 Cans of beer per week    Comment: 2-3 beers a night  . Drug use: No  . Sexual activity: Yes  Other Topics Concern  . Not on file  Social History  Narrative   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    Social Determinants of Health   Financial Resource Strain: Low Risk   . Difficulty of Paying Living Expenses: Not hard at all  Food Insecurity: No Food Insecurity  . Worried About Charity fundraiser in the Last Year: Never true  . Ran Out of Food in the Last Year: Never true  Transportation Needs: No Transportation Needs  . Lack of Transportation (Medical): No  . Lack of Transportation (Non-Medical): No  Physical Activity: Sufficiently Active  . Days of Exercise per Week: 7 days  . Minutes of Exercise per Session: 40 min  Stress: No Stress Concern Present  . Feeling of Stress : Not at all  Social Connections: Slightly Isolated  . Frequency of Communication with Friends and Family: More than three times a week  . Frequency of Social Gatherings with Friends and Family: More than three times a week  . Attends Religious Services: More than 4 times per year  . Active Member of Clubs or Organizations: Yes  . Attends Archivist Meetings: More than 4 times per year  . Marital Status: Divorced    Outpatient Encounter Medications as of 12/31/2019  Medication Sig  . budesonide-formoterol (SYMBICORT) 160-4.5 MCG/ACT inhaler Inhale 2 puffs into the lungs 2 (two) times daily.  . colchicine (COLCRYS) 0.6 MG tablet TAKE 2 TABLETS BY MOUTH TODAY, THEN 1 TABLET DAILY (Patient taking differently: TAKE 2 TABLETS BY MOUTH TODAY, THEN 1 TABLET DAILY as needed)  . naproxen sodium (ALEVE) 220 MG tablet Take 220 mg by mouth daily as needed.  . sildenafil (REVATIO) 20 MG tablet TAKE TWO AND A HALF (2.5) TABLETS BY MOUTH 30 MINUTES BEFORE SEXUAL INTERCOURSE  . indomethacin (INDOCIN) 50 MG capsule Take 1 capsule (50 mg total) by mouth 2 (two) times daily with a meal. (Patient not taking: Reported on 11/14/2019)   No facility-administered encounter medications on file as of 12/31/2019.    Activities of Daily Living In your present state of  health, do you have any difficulty performing the following activities: 12/31/2019  Hearing? N  Vision? N  Difficulty concentrating or making decisions? N  Walking or climbing stairs? Y  Comment Due to SOB.  Dressing or bathing? N  Doing errands, shopping? N  Preparing Food and eating ? N  Using the Toilet? N  In the past six months, have you accidently leaked urine? N  Do you have problems with loss of bowel control? N  Managing your Medications? N  Managing your Finances? N  Housekeeping or managing your Housekeeping? N  Some recent data might be hidden    Patient Care Team: Birdie Sons, MD as PCP - General (Family Medicine) Idelle Leech, OD as Consulting Physician (Optometry)   Assessment:   This is a routine wellness examination for Yohannes.  Exercise Activities and Dietary recommendations Current Exercise Habits: Home exercise routine, Type of exercise: strength training/weights;stretching;walking, Time (Minutes): 60, Frequency (Times/Week): 7, Weekly Exercise (Minutes/Week): 420, Intensity: Moderate, Exercise limited by: None identified  Goals    . DIET - INCREASE WATER INTAKE     Recommend to drink at least 6-8 8oz glasses of water per day.    . Exercise 3x per week (30 min per time)     Starting 1/10/018, I will start back exercising 3 times a week for at least 30 minutes.   12/17/17- Recommend to start back walking 5 days a week for at least 30 minutes at a time. Pt states he plans to walk 3 miles at a time.        Fall Risk: Fall Risk  12/31/2019 12/27/2018 12/17/2017 12/13/2016 12/01/2015  Falls in the past year? 0 1 Yes No No  Number falls in past yr: 0 0 1 - -  Injury with Fall? 0 0 No - -  Follow up - Falls prevention discussed Falls prevention discussed - -    FALL RISK PREVENTION PERTAINING TO THE HOME:  Any stairs in or around the home? Yes  If so, are there any without handrails? No   Home free of loose throw rugs in walkways, pet beds,  electrical cords, etc? Yes  Adequate lighting in your home to reduce risk of falls? Yes   ASSISTIVE DEVICES UTILIZED TO PREVENT FALLS:  Life alert? No  Use of a cane, walker or w/c? No  Grab bars in the bathroom? No  Shower chair or bench in shower? No  Elevated toilet seat or a handicapped toilet? No   TIMED UP AND GO:  Was the test performed? No .    Depression Screen PHQ 2/9 Scores 12/31/2019 12/31/2019 12/27/2018 12/27/2018  PHQ - 2 Score 0 0 0 0  PHQ- 9 Score - - 0 -    Cognitive Function: Declined today.     6CIT Screen 12/27/2018 12/17/2017  What Year? 0 points 0 points  What month? 0 points 0 points  What time? 0 points 0 points  Count back from 20 0 points 0 points  Months in reverse 0 points 0 points  Repeat phrase 0 points 4 points  Total Score 0 4    Immunization History  Administered Date(s) Administered  . Influenza, High Dose Seasonal PF 10/20/2016, 09/28/2017, 09/03/2018  . Influenza,inj,Quad PF,6+ Mos 12/01/2015  . Influenza-Unspecified 09/28/2017, 09/05/2019  . Pneumococcal Conjugate-13 11/24/2013  . Pneumococcal Polysaccharide-23 11/25/2014  . Td 11/22/2012  . Tdap 11/22/2012  . Zoster 11/22/2012  . Zoster Recombinat (Shingrix) 09/05/2019    Qualifies for Shingles Vaccine? Yes , first dose received. Awaiting second dose.   Tdap: Up to date  Flu Vaccine: Up to date  Pneumococcal Vaccine: Completed series  Screening Tests Health Maintenance  Topic Date Due  . COLONOSCOPY  07/26/2020  . TETANUS/TDAP  11/22/2022  . INFLUENZA VACCINE  Completed  . Hepatitis C Screening  Completed  . PNA vac Low Risk Adult  Completed   Cancer Screenings:  Colorectal Screening: Completed 07/26/17. Repeat every 3 years.   Lung Cancer Screening: (Low Dose CT Chest recommended if Age 90-80 years, 30 pack-year currently smoking OR have quit w/in 15years.) does not qualify.   Additional Screening:  Hepatitis C Screening: Up to date  Vision Screening:  Recommended annual ophthalmology exams for early detection of glaucoma and other disorders of the eye.  Dental Screening: Recommended annual dental exams for  proper oral hygiene  Community Resource Referral:  CRR required this visit?  No        Plan:  I have personally reviewed and addressed the Medicare Annual Wellness questionnaire and have noted the following in the patient's chart:  A. Medical and social history B. Use of alcohol, tobacco or illicit drugs  C. Current medications and supplements D. Functional ability and status E.  Nutritional status F.  Physical activity G. Advance directives H. List of other physicians I.  Hospitalizations, surgeries, and ER visits in previous 12 months J.  [place] such as hearing and vision if needed, cognitive and depression L. Referrals and appointments   In addition, I have reviewed and discussed with patient certain preventive protocols, quality metrics, and best practice recommendations. A written personalized care plan for preventive services as well as general preventive health recommendations were provided to patient.   Glendora Score, [place]  [id number] Nurse Health Advisor   Nurse Notes: None.

## 2019-12-31 ENCOUNTER — Other Ambulatory Visit: Payer: Self-pay

## 2019-12-31 ENCOUNTER — Ambulatory Visit (INDEPENDENT_AMBULATORY_CARE_PROVIDER_SITE_OTHER): Payer: Medicare HMO

## 2019-12-31 DIAGNOSIS — Z0000 Encounter for general adult medical examination without abnormal findings: Secondary | ICD-10-CM | POA: Diagnosis not present

## 2019-12-31 NOTE — Patient Instructions (Signed)
Michael Hicks , Thank you for taking time to come for your Medicare Wellness Visit. I appreciate your ongoing commitment to your health goals. Please review the following plan we discussed and let me know if I can assist you in the future.   Screening recommendations/referrals: Colonoscopy: Up to date, due 07/2020 Recommended yearly ophthalmology/optometry visit for glaucoma screening and checkup Recommended yearly dental visit for hygiene and checkup  Vaccinations: Influenza vaccine: Up to date Pneumococcal vaccine: Completed series Tdap vaccine: Up to date, due 11/2022 Shingles vaccine: Awaiting Shingrix #2    Advanced directives: Please bring a copy of your POA (Power of [place]) and/or Living Will to your next appointment.   Conditions/risks identified: Recommend to increase water intake to 6-8 8 oz glasses a day.   Next appointment: 01/01/71 @ 10:00 AM with Dr Caryn Section.   Preventive Care 28 Years and Older, Male Preventive care refers to lifestyle choices and visits with your health care provider that can promote health and wellness. What does preventive care include?  A yearly physical exam. This is also called an annual well check.  Dental exams once or twice a year.  Routine eye exams. Ask your health care provider how often you should have your eyes checked.  Personal lifestyle choices, including:  Daily care of your teeth and gums.  Regular physical activity.  Eating a healthy diet.  Avoiding tobacco and drug use.  Limiting alcohol use.  Practicing safe sex.  Taking low doses of aspirin every day.  Taking vitamin and mineral supplements as recommended by your health care provider. What happens during an annual well check? The services and screenings done by your health care provider during your annual well check will depend on your age, overall health, lifestyle risk factors, and family history of disease. Counseling  Your health care provider may ask you  questions about your:  Alcohol use.  Tobacco use.  Drug use.  Emotional well-being.  Home and relationship well-being.  Sexual activity.  Eating habits.  History of falls.  Memory and ability to understand (cognition).  Work and work Statistician. Screening  You may have the following tests or measurements:  Height, weight, and BMI.  Blood pressure.  Lipid and cholesterol levels. These may be checked every 5 years, or more frequently if you are over 79 years old.  Skin check.  Lung cancer screening. You may have this screening every year starting at age 72 if you have a 30-pack-year history of smoking and currently smoke or have quit within the past 15 years.  Fecal occult blood test (FOBT) of the stool. You may have this test every year starting at age 72  Flexible sigmoidoscopy or colonoscopy. You may have a sigmoidoscopy every 5 years or a colonoscopy every 10 years starting at age 72  Prostate cancer screening. Recommendations will vary depending on your family history and other risks.  Hepatitis C blood test.  Hepatitis B blood test.  Sexually transmitted disease (STD) testing.  Diabetes screening. This is done by checking your blood sugar (glucose) after you have not eaten for a while (fasting). You may have this done every 1-3 years.  Abdominal aortic aneurysm (AAA) screening. You may need this if you are a current or former smoker.  Osteoporosis. You may be screened starting at age 72 if you are at high risk. Talk with your health care provider about your test results, treatment options, and if necessary, the need for more tests. Vaccines  Your health care provider may  recommend certain vaccines, such as:  Influenza vaccine. This is recommended every year.  Tetanus, diphtheria, and acellular pertussis (Tdap, Td) vaccine. You may need a Td booster every 10 years.  Zoster vaccine. You may need this after age 72.  Pneumococcal 13-valent conjugate  (PCV13) vaccine. One dose is recommended after age 72.  Pneumococcal polysaccharide (PPSV23) vaccine. One dose is recommended after age 72. Talk to your health care provider about which screenings and vaccines you need and how often you need them. This information is not intended to replace advice given to you by your health care provider. Make sure you discuss any questions you have with your health care provider. Document Released: 12/17/2015 Document Revised: 08/09/2016 Document Reviewed: 09/21/2015 Elsevier Interactive Patient Education  2017 [place] Prevention in the Home Falls can cause injuries. They can happen to people of all ages. There are many things you can do to make your home safe and to help prevent falls. What can I do on the outside of my home?  Regularly fix the edges of walkways and driveways and fix any cracks.  Remove anything that might make you trip as you walk through a door, such as a raised step or threshold.  Trim any bushes or trees on the path to your home.  Use bright outdoor lighting.  Clear any walking paths of anything that might make someone trip, such as rocks or tools.  Regularly check to see if handrails are loose or broken. Make sure that both sides of any steps have handrails.  Any raised decks and porches should have guardrails on the edges.  Have any leaves, snow, or ice cleared regularly.  Use sand or salt on walking paths during winter.  Clean up any spills in your garage right away. This includes oil or grease spills. What can I do in the bathroom?  Use night lights.  Install grab bars by the toilet and in the tub and shower. Do not use towel bars as grab bars.  Use non-skid mats or decals in the tub or shower.  If you need to sit down in the shower, use a plastic, non-slip stool.  Keep the floor dry. Clean up any water that spills on the floor as soon as it happens.  Remove soap buildup in the tub or shower  regularly.  Attach bath mats securely with double-sided non-slip rug tape.  Do not have throw rugs and other things on the floor that can make you trip. What can I do in the bedroom?  Use night lights.  Make sure that you have a light by your bed that is easy to reach.  Do not use any sheets or blankets that are too big for your bed. They should not hang down onto the floor.  Have a firm chair that has side arms. You can use this for support while you get dressed.  Do not have throw rugs and other things on the floor that can make you trip. What can I do in the kitchen?  Clean up any spills right away.  Avoid walking on wet floors.  Keep items that you use a lot in easy-to-reach places.  If you need to reach something above you, use a strong step stool that has a grab bar.  Keep electrical cords out of the way.  Do not use floor polish or wax that makes floors slippery. If you must use wax, use non-skid floor wax.  Do not have throw rugs and  other things on the floor that can make you trip. What can I do with my stairs?  Do not leave any items on the stairs.  Make sure that there are handrails on both sides of the stairs and use them. Fix handrails that are broken or loose. Make sure that handrails are as long as the stairways.  Check any carpeting to make sure that it is firmly attached to the stairs. Fix any carpet that is loose or worn.  Avoid having throw rugs at the top or bottom of the stairs. If you do have throw rugs, attach them to the floor with carpet tape.  Make sure that you have a light switch at the top of the stairs and the bottom of the stairs. If you do not have them, ask someone to add them for you. What else can I do to help prevent falls?  Wear shoes that:  Do not have high heels.  Have rubber bottoms.  Are comfortable and fit you well.  Are closed at the toe. Do not wear sandals.  If you use a stepladder:  Make sure that it is fully  opened. Do not climb a closed stepladder.  Make sure that both sides of the stepladder are locked into place.  Ask someone to hold it for you, if possible.  Clearly mark and make sure that you can see:  Any grab bars or handrails.  First and last steps.  Where the edge of each step is.  Use tools that help you move around (mobility aids) if they are needed. These include:  Canes.  Walkers.  Scooters.  Crutches.  Turn on the lights when you go into a dark area. Replace any light bulbs as soon as they burn out.  Set up your furniture so you have a clear path. Avoid moving your furniture around.  If any of your floors are uneven, fix them.  If there are any pets around you, be aware of where they are.  Review your medicines with your doctor. Some medicines can make you feel dizzy. This can increase your chance of falling. Ask your doctor what other things that you can do to help prevent falls. This information is not intended to replace advice given to you by your health care provider. Make sure you discuss any questions you have with your health care provider. Document Released: 09/16/2009 Document Revised: 04/27/2016 Document Reviewed: 12/25/2014 Elsevier Interactive Patient Education  2017 [employer].

## 2020-01-02 ENCOUNTER — Ambulatory Visit: Payer: Medicare HMO

## 2020-01-02 ENCOUNTER — Encounter: Payer: Medicare HMO | Admitting: Family Medicine

## 2020-01-06 DIAGNOSIS — H5213 Myopia, bilateral: Secondary | ICD-10-CM | POA: Diagnosis not present

## 2020-01-21 ENCOUNTER — Telehealth: Payer: Self-pay | Admitting: Family Medicine

## 2020-01-21 NOTE — Telephone Encounter (Signed)
Pt has an appt in April 2021 and he would like more samples of symbicort 160 mcg inhaler.  Pt said med is working

## 2020-01-21 NOTE — Telephone Encounter (Signed)
Please advise if ok to give samples if we have some available.  

## 2020-01-22 NOTE — Telephone Encounter (Signed)
That's fine. He can have two sample inhalers if he likes.

## 2020-01-23 MED ORDER — BUDESONIDE-FORMOTEROL FUMARATE 160-4.5 MCG/ACT IN AERO: 2.0000 | INHALATION_SPRAY | Freq: Two times a day (BID) | RESPIRATORY_TRACT | 0 refills | Status: DC

## 2020-01-23 NOTE — Addendum Note (Signed)
Addended by: Randal Buba on: 01/23/2020 10:02 AM   Modules accepted: Orders

## 2020-01-23 NOTE — Telephone Encounter (Signed)
Samples placed up front. Patient advised.

## 2020-03-24 ENCOUNTER — Other Ambulatory Visit: Payer: Self-pay

## 2020-03-24 ENCOUNTER — Telehealth (INDEPENDENT_AMBULATORY_CARE_PROVIDER_SITE_OTHER): Payer: Self-pay | Admitting: Gastroenterology

## 2020-03-24 DIAGNOSIS — D125 Benign neoplasm of sigmoid colon: Secondary | ICD-10-CM

## 2020-03-24 NOTE — Progress Notes (Signed)
Gastroenterology Pre-Procedure Review  Request Date: Thursday 07/29/20 Requesting Physician: Dr. Allen Norris  PATIENT REVIEW QUESTIONS: The patient responded to the following health history questions as indicated:    1. Are you having any GI issues? no 2. Do you have a personal history of Polyps? yes (3 years ago) 3. Do you have a family history of Colon Cancer or Polyps? no 4. Diabetes Mellitus? no 5. Joint replacements in the past 12 months?no 6. Major health problems in the past 3 months?no 7. Any artificial heart valves, MVP, or defibrillator?no    MEDICATIONS & ALLERGIES:    Patient reports the following regarding taking any anticoagulation/antiplatelet therapy:   Plavix, Coumadin, Eliquis, Xarelto, Lovenox, Pradaxa, Brilinta, or Effient? no Aspirin? no  Patient confirms/reports the following medications:  Current Outpatient Medications  Medication Sig Dispense Refill  . budesonide-formoterol (SYMBICORT) 160-4.5 MCG/ACT inhaler Inhale 2 puffs into the lungs 2 (two) times daily. 2 Inhaler 0  . colchicine (COLCRYS) 0.6 MG tablet TAKE 2 TABLETS BY MOUTH TODAY, THEN 1 TABLET DAILY (Patient not taking: Reported on 03/24/2020) 30 tablet 5  . indomethacin (INDOCIN) 50 MG capsule Take 1 capsule (50 mg total) by mouth 2 (two) times daily with a meal. (Patient not taking: Reported on 11/14/2019) 30 capsule 0  . naproxen sodium (ALEVE) 220 MG tablet Take 220 mg by mouth daily as needed.    . sildenafil (REVATIO) 20 MG tablet TAKE TWO AND A HALF (2.5) TABLETS BY MOUTH 30 MINUTES BEFORE SEXUAL INTERCOURSE 50 tablet 2   No current facility-administered medications for this visit.    Patient confirms/reports the following allergies:  No Known Allergies  No orders of the defined types were placed in this encounter.   AUTHORIZATION INFORMATION Primary Insurance: 1D#: Group #:  Secondary Insurance: 1D#: Group #:  SCHEDULE INFORMATION: Date: 07/29/20 Time: Location:[facility]

## 2020-03-24 NOTE — Progress Notes (Signed)
Gastroenterology Pre-Procedure Review  Request Date: Thursday 07/29/20 Requesting Physician: Dr. Allen Norris  PATIENT REVIEW QUESTIONS: The patient responded to the following health history questions as indicated:    1. Are you having any GI issues? no 2. Do you have a personal history of Polyps? yes (3 years ago ) 3. Do you have a family history of Colon Cancer or Polyps? no 4. Diabetes Mellitus? no 5. Joint replacements in the past 12 months?no 6. Major health problems in the past 3 months?no 7. Any artificial heart valves, MVP, or defibrillator?no    MEDICATIONS & ALLERGIES:    Patient reports the following regarding taking any anticoagulation/antiplatelet therapy:   Plavix, Coumadin, Eliquis, Xarelto, Lovenox, Pradaxa, Brilinta, or Effient? no Aspirin? no  Patient confirms/reports the following medications:  Current Outpatient Medications  Medication Sig Dispense Refill  . budesonide-formoterol (SYMBICORT) 160-4.5 MCG/ACT inhaler Inhale 2 puffs into the lungs 2 (two) times daily. 2 Inhaler 0  . naproxen sodium (ALEVE) 220 MG tablet Take 220 mg by mouth daily as needed.    . sildenafil (REVATIO) 20 MG tablet TAKE TWO AND A HALF (2.5) TABLETS BY MOUTH 30 MINUTES BEFORE SEXUAL INTERCOURSE 50 tablet 2  . colchicine (COLCRYS) 0.6 MG tablet TAKE 2 TABLETS BY MOUTH TODAY, THEN 1 TABLET DAILY (Patient not taking: Reported on 03/24/2020) 30 tablet 5  . indomethacin (INDOCIN) 50 MG capsule Take 1 capsule (50 mg total) by mouth 2 (two) times daily with a meal. (Patient not taking: Reported on 11/14/2019) 30 capsule 0   No current facility-administered medications for this visit.    Patient confirms/reports the following allergies:  No Known Allergies  Orders Placed This Encounter  Procedures  . Procedural/ Surgical Case Request: COLONOSCOPY WITH PROPOFOL    Standing Status:   Standing    Number of Occurrences:   1    Order Specific Question:   Pre-op diagnosis    Answer:   beinign neoplasm  of the colon    Order Specific Question:   CPT Code    Answer:   [id number]    AUTHORIZATION INFORMATION Primary Insurance: 1D#: Group #:  Secondary Insurance: 1D#: Group #:  SCHEDULE INFORMATION: Date: Thursday 07/29/20 Time: Location:[facility]

## 2020-04-02 ENCOUNTER — Other Ambulatory Visit: Payer: Self-pay

## 2020-04-02 ENCOUNTER — Encounter: Payer: Self-pay | Admitting: Family Medicine

## 2020-04-02 ENCOUNTER — Ambulatory Visit (INDEPENDENT_AMBULATORY_CARE_PROVIDER_SITE_OTHER): Payer: Medicare HMO | Admitting: Family Medicine

## 2020-04-02 VITALS — BP 120/70 | HR 80 | Temp 97.5°F | Resp 16 | Wt 192.0 lb

## 2020-04-02 DIAGNOSIS — Z8739 Personal history of other diseases of the musculoskeletal system and connective tissue: Secondary | ICD-10-CM | POA: Insufficient documentation

## 2020-04-02 DIAGNOSIS — R0602 Shortness of breath: Secondary | ICD-10-CM | POA: Diagnosis not present

## 2020-04-02 DIAGNOSIS — E785 Hyperlipidemia, unspecified: Secondary | ICD-10-CM | POA: Diagnosis not present

## 2020-04-02 DIAGNOSIS — M109 Gout, unspecified: Secondary | ICD-10-CM

## 2020-04-02 DIAGNOSIS — Z0000 Encounter for general adult medical examination without abnormal findings: Secondary | ICD-10-CM | POA: Diagnosis not present

## 2020-04-02 DIAGNOSIS — Z86010 Personal history of colonic polyps: Secondary | ICD-10-CM | POA: Diagnosis not present

## 2020-04-02 DIAGNOSIS — Z125 Encounter for screening for malignant neoplasm of prostate: Secondary | ICD-10-CM

## 2020-04-02 MED ORDER — COLCHICINE 0.6 MG PO TABS: ORAL_TABLET | ORAL | 5 refills | Status: DC

## 2020-04-02 MED ORDER — BUDESONIDE-FORMOTEROL FUMARATE 160-4.5 MCG/ACT IN AERO: 2.0000 | INHALATION_SPRAY | Freq: Two times a day (BID) | RESPIRATORY_TRACT | 0 refills | Status: DC

## 2020-04-02 NOTE — Patient Instructions (Addendum)
.   Please review the attached list of medications and notify my office if there are any errors.   . Please bring all of your medications to every appointment so we can make sure that our medication list is the same as yours.   

## 2020-04-02 NOTE — Progress Notes (Signed)
Complete physical exam   Patient: Michael Hicks   DOB: 10/23/1946   74 y.o. Male  MRN: [id number]   Today's healthcare provider: Lelon Huh, MD   Chief Complaint  Patient presents with  . Annual Exam   Had AWV with [facility] on 12/31/2019  Subjective    Michael Hicks is a 74 y.o. male who presents today for a complete physical exam.  He reports consuming a general diet. Home exercise routine includes walking 3 miles daily. He generally feels fairly well. He reports sleeping fairly well. He does not have additional problems to discuss today.  HPI  He was seen in December for shortness of breath, had normal labs and was started on Symbicort samples due to smoking history. He states he has noticed significant improvement in breathing since then and is tolerating Symbicort well.   Past Medical History:  Diagnosis Date  . Erectile dysfunction   . Gout   . Hx of colonic polyps   . SOB (shortness of breath)    Past Surgical History:  Procedure Laterality Date  . COLONOSCOPY    . COLONOSCOPY WITH PROPOFOL N/A 01/14/2016   Procedure: COLONOSCOPY WITH PROPOFOL;  Surgeon: Lucilla Lame, MD;  Location: [facility];  Service: Endoscopy;  Laterality: N/A;  . COLONOSCOPY WITH PROPOFOL N/A 07/26/2017   Procedure: COLONOSCOPY WITH PROPOFOL;  Surgeon: Lucilla Lame, MD;  Location: [facility];  Service: Gastroenterology;  Laterality: N/A;  . POLYPECTOMY  01/14/2016   Procedure: POLYPECTOMY;  Surgeon: Lucilla Lame, MD;  Location: [facility];  Service: Endoscopy;;   Social History   Socioeconomic History  . Marital status: Divorced    Spouse name: Not on file  . Number of children: 1  . Years of education: 76  . Highest education level: Associate degree: occupational, Hotel manager, or vocational program  Occupational History  . Occupation: Finance    Comment: RETIRED  Tobacco Use  . Smoking status: Former Smoker    Packs/day: 1.00    Years: 15.00    Pack  years: 15.00    Types: Cigarettes    Quit date: 12/04/1990    Years since quitting: 29.3  . Smokeless tobacco: Never Used  Substance and Sexual Activity  . Alcohol use: Yes    Alcohol/week: 14.0 - 21.0 standard drinks    Types: 14 - 21 Cans of beer per week    Comment: 2-3 beers a night  . Drug use: No  . Sexual activity: Yes  Other Topics Concern  . Not on file  Social History Narrative   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    Social Determinants of Health   Financial Resource Strain: Low Risk   . Difficulty of Paying Living Expenses: Not hard at all  Food Insecurity: No Food Insecurity  . Worried About Charity fundraiser in the Last Year: Never true  . Ran Out of Food in the Last Year: Never true  Transportation Needs: No Transportation Needs  . Lack of Transportation (Medical): No  . Lack of Transportation (Non-Medical): No  Physical Activity: Sufficiently Active  . Days of Exercise per Week: 7 days  . Minutes of Exercise per Session: 40 min  Stress: No Stress Concern Present  . Feeling of Stress : Not at all  Social Connections: Slightly Isolated  . Frequency of Communication with Friends and Family: More than three times a week  . Frequency of Social Gatherings with Friends and Family: More than three  times a week  . Attends Religious Services: More than 4 times per year  . Active Member of Clubs or Organizations: Yes  . Attends Archivist Meetings: More than 4 times per year  . Marital Status: Divorced  Human resources officer Violence: Not At Risk  . Fear of Current or Ex-Partner: No  . Emotionally Abused: No  . Physically Abused: No  . Sexually Abused: No   Family Status  Relation Name Status  . Mother  Deceased  . Father  Deceased       cerebrovascular accident  . Brother  Deceased  . Brother  Alive  . Brother  Alive   Family History  Problem Relation Age of Onset  . COPD Mother   . Heart disease Mother   . Hypertension Father   . Cancer  Brother        bone cancer   No Known Allergies  Patient Care Team: Birdie Sons, MD as PCP - General (Family Medicine) Idelle Leech, OD as Consulting Physician (Optometry)   Medications: Outpatient Medications Prior to Visit  Medication Sig  . budesonide-formoterol (SYMBICORT) 160-4.5 MCG/ACT inhaler Inhale 2 puffs into the lungs 2 (two) times daily.  . colchicine (COLCRYS) 0.6 MG tablet TAKE 2 TABLETS BY MOUTH TODAY, THEN 1 TABLET DAILY  . naproxen sodium (ALEVE) 220 MG tablet Take 220 mg by mouth daily as needed.  . sildenafil (REVATIO) 20 MG tablet TAKE TWO AND A HALF (2.5) TABLETS BY MOUTH 30 MINUTES BEFORE SEXUAL INTERCOURSE  . [DISCONTINUED] indomethacin (INDOCIN) 50 MG capsule Take 1 capsule (50 mg total) by mouth 2 (two) times daily with a meal. (Patient not taking: Reported on 11/14/2019)   No facility-administered medications prior to visit.    Review of Systems  Constitutional: Negative for appetite change, chills, fatigue and fever.  HENT: Negative for congestion, ear pain, hearing loss, nosebleeds and trouble swallowing.   Eyes: Negative for pain and visual disturbance.  Respiratory: Positive for shortness of breath. Negative for cough and chest tightness.   Cardiovascular: Negative for chest pain, palpitations and leg swelling.  Gastrointestinal: Negative for abdominal pain, blood in stool, constipation, diarrhea, nausea and vomiting.  Endocrine: Negative for polydipsia, polyphagia and polyuria.  Genitourinary: Negative for dysuria and flank pain.  Musculoskeletal: Negative for arthralgias, back pain, joint swelling, myalgias and neck stiffness.  Skin: Negative for color change, rash and wound.  Neurological: Negative for dizziness, tremors, seizures, speech difficulty, weakness, light-headedness and headaches.  Psychiatric/Behavioral: Negative for behavioral problems, confusion, decreased concentration, dysphoric mood and sleep disturbance. The patient is not  nervous/anxious.   All other systems reviewed and are negative.     Objective    BP 120/70 (BP Location: Left Arm, Patient Position: Sitting, Cuff Size: Normal)   Pulse 80   Temp (!) 97.5 F (36.4 C) (Temporal)   Resp 16   Wt 192 lb (87.1 kg)   SpO2 96% Comment: room air  BMI 26.78 kg/m    Physical Exam   General Appearance:     Overweight male. Alert, cooperative, in no acute distress, appears stated age  Head:    Normocephalic, without obvious abnormality, atraumatic  Eyes:    PERRL, conjunctiva/corneas clear, EOM's intact, fundi    benign, both eyes       Ears:    Normal TM's and external ear canals, both ears  Nose:   Nares normal, septum midline, mucosa normal, no drainage   or sinus tenderness  Throat:   Lips,  mucosa, and tongue normal; teeth and gums normal  Neck:   Supple, symmetrical, trachea midline, no adenopathy;       thyroid:  No enlargement/tenderness/nodules; no carotid   bruit or JVD  Back:     Symmetric, no curvature, ROM normal, no CVA tenderness  Lungs:     Clear to auscultation bilaterally, respirations unlabored  Chest wall:    No tenderness or deformity  Heart:    Normal heart rate. Normal rhythm. No murmurs, rubs, or gallops.  S1 and S2 normal  Abdomen:     Soft, non-tender, bowel sounds active all four quadrants,    no masses, no organomegaly  Genitalia:    deferred  Rectal:    deferred  Extremities:   All extremities are intact. No cyanosis or edema  Pulses:   2+ and symmetric all extremities  Skin:   Skin color, texture, turgor normal, no rashes or lesions  Lymph nodes:   Cervical, supraclavicular, and axillary nodes normal  Neurologic:   CNII-XII intact. Normal strength, sensation and reflexes      throughout    Depression Screen  PHQ 2/9 Scores 12/31/2019 12/31/2019 12/27/2018  PHQ - 2 Score 0 0 0  PHQ- 9 Score - - 0    No results found for any visits on 04/02/20.  Assessment & Plan    Routine Health Maintenance and Physical  Exam  Exercise Activities and Dietary recommendations Goals    . DIET - INCREASE WATER INTAKE     Recommend to drink at least 6-8 8oz glasses of water per day.    . Exercise 3x per week (30 min per time)     Starting 1/10/018, I will start back exercising 3 times a week for at least 30 minutes.   12/17/17- Recommend to start back walking 5 days a week for at least 30 minutes at a time. Pt states he plans to walk 3 miles at a time.        Immunization History  Administered Date(s) Administered  . Influenza, High Dose Seasonal PF 10/20/2016, 09/28/2017, 09/03/2018  . Influenza,inj,Quad PF,6+ Mos 12/01/2015  . Influenza-Unspecified 09/28/2017, 09/05/2019  . Pneumococcal Conjugate-13 11/24/2013  . Pneumococcal Polysaccharide-23 11/25/2014  . Td 11/22/2012  . Tdap 11/22/2012  . Zoster 11/22/2012  . Zoster Recombinat (Shingrix) 09/05/2019    Health Maintenance  Topic Date Due  . COVID-19 Vaccine (1) Never done  . INFLUENZA VACCINE  07/04/2020  . COLONOSCOPY  07/26/2020  . TETANUS/TDAP  11/22/2022  . Hepatitis C Screening  Completed  . PNA vac Low Risk Adult  Completed    Discussed health benefits of physical activity, and encouraged him to engage in regular exercise appropriate for his age and condition.  1. Annual physical exam Doing well, mildly overweight but is exercising more now that his breathing is improved.   2. Prostate cancer screening  - PSA  3. Personal history of colonic polyps He states has scheduled follow up colonoscopy with Dr. Allen Norris  4. Hyperlipidemia, unspecified hyperlipidemia type Diet controlled.  - Lipid panel - Comprehensive metabolic panel  5. Gout involving toe of left foot, unspecified cause, unspecified chronicity He reports having flares just a few times per year and colchicine remains very effective.  - colchicine (COLCRYS) 0.6 MG tablet; TAKE 2 TABLETS BY MOUTH TODAY, THEN 1 TABLET DAILY  Dispense: 30 tablet; Refill: 5  6. Shortness  of breath Likely mild COPD due to history of smoking which he quit many years ago. Improved since starting  Symbicort in December which is refilled today.  - budesonide-formoterol (SYMBICORT) 160-4.5 MCG/ACT inhaler; Inhale 2 puffs into the lungs 2 (two) times daily.  Dispense: 2 Inhaler; Refill: 0   No follow-ups on file.     The entirety of the information documented in the History of Present Illness, Review of Systems and Physical Exam were personally obtained by me. Portions of this information were initially documented by the CMA and reviewed by me for thoroughness and accuracy.      Lelon Huh, MD  [facility] [phone] (phone) [phone] (fax)  [place]

## 2020-04-03 LAB — COMPREHENSIVE METABOLIC PANEL
ALT: 18 IU/L (ref 0–44)
AST: 25 IU/L (ref 0–40)
Albumin/Globulin Ratio: 1.8 (ref 1.2–2.2)
Albumin: 4.3 g/dL (ref 3.7–4.7)
Alkaline Phosphatase: 105 IU/L (ref 39–117)
BUN/Creatinine Ratio: 20 (ref 10–24)
BUN: 19 mg/dL (ref 8–27)
Bilirubin Total: 0.8 mg/dL (ref 0.0–1.2)
CO2: 21 mmol/L (ref 20–29)
Calcium: 9.2 mg/dL (ref 8.6–10.2)
Chloride: 103 mmol/L (ref 96–106)
Creatinine, Ser: 0.94 mg/dL (ref 0.76–1.27)
GFR calc Af Amer: 94 mL/min/{1.73_m2} (ref >59)
GFR calc non Af Amer: 81 mL/min/{1.73_m2} (ref >59)
Globulin, Total: 2.4 g/dL (ref 1.5–4.5)
Glucose: 89 mg/dL (ref 65–99)
Potassium: 4.1 mmol/L (ref 3.5–5.2)
Sodium: 139 mmol/L (ref 134–144)
Total Protein: 6.7 g/dL (ref 6.0–8.5)

## 2020-04-03 LAB — LIPID PANEL
Chol/HDL Ratio: 3.1 ratio (ref 0.0–5.0)
Cholesterol, Total: 165 mg/dL (ref 100–199)
HDL: 53 mg/dL (ref >39)
LDL Chol Calc (NIH): 83 mg/dL (ref 0–99)
Triglycerides: 171 mg/dL — ABNORMAL HIGH (ref 0–149)
VLDL Cholesterol Cal: 29 mg/dL (ref 5–40)

## 2020-04-03 LAB — PSA: Prostate Specific Ag, Serum: 1.5 ng/mL (ref 0.0–4.0)

## 2020-07-22 ENCOUNTER — Encounter: Payer: Self-pay | Admitting: Anesthesiology

## 2020-07-22 ENCOUNTER — Other Ambulatory Visit: Payer: Self-pay

## 2020-07-28 ENCOUNTER — Other Ambulatory Visit: Payer: Self-pay

## 2020-07-28 ENCOUNTER — Other Ambulatory Visit
Admission: RE | Admit: 2020-07-28 | Discharge: 2020-07-28 | Disposition: A | Discharge status: Home / Self Care | Payer: Medicare HMO | Source: Ambulatory Visit | Attending: Gastroenterology | Admitting: Gastroenterology

## 2020-07-28 DIAGNOSIS — Z01812 Encounter for preprocedural laboratory examination: Secondary | ICD-10-CM | POA: Diagnosis present

## 2020-07-28 DIAGNOSIS — U071 COVID-19: Secondary | ICD-10-CM | POA: Insufficient documentation

## 2020-07-28 LAB — SARS CORONAVIRUS 2 (TAT 6-24 HRS): SARS Coronavirus 2: POSITIVE — AB

## 2020-07-28 NOTE — Discharge Instructions (Signed)
General Anesthesia, Adult, Care After This sheet gives you information about how to care for yourself after your procedure. Your health care provider may also give you more specific instructions. If you have problems or questions, contact your health care provider. What can I expect after the procedure? After the procedure, the following side effects are common:  Pain or discomfort at the IV site.  Nausea.  Vomiting.  Sore throat.  Trouble concentrating.  Feeling cold or chills.  Weak or tired.  Sleepiness and fatigue.  Soreness and body aches. These side effects can affect parts of the body that were not involved in surgery. Follow these instructions at home:  For at least 24 hours after the procedure:  Have a responsible adult stay with you. It is important to have someone help care for you until you are awake and alert.  Rest as needed.  Do not: ? Participate in activities in which you could fall or become injured. ? Drive. ? Use heavy machinery. ? Drink alcohol. ? Take sleeping pills or medicines that cause drowsiness. ? Make important decisions or sign legal documents. ? Take care of children on your own. Eating and drinking  Follow any instructions from your health care provider about eating or drinking restrictions.  When you feel hungry, start by eating small amounts of foods that are soft and easy to digest (bland), such as toast. Gradually return to your regular diet.  Drink enough fluid to keep your urine pale yellow.  If you vomit, rehydrate by drinking water, juice, or clear broth. General instructions  If you have sleep apnea, surgery and certain medicines can increase your risk for breathing problems. Follow instructions from your health care provider about wearing your sleep device: ? Anytime you are sleeping, including during daytime naps. ? While taking prescription pain medicines, sleeping medicines, or medicines that make you drowsy.  Return to  your normal activities as told by your health care provider. Ask your health care provider what activities are safe for you.  Take over-the-counter and prescription medicines only as told by your health care provider.  If you smoke, do not smoke without supervision.  Keep all follow-up visits as told by your health care provider. This is important. Contact a health care provider if:  You have nausea or vomiting that does not get better with medicine.  You cannot eat or drink without vomiting.  You have pain that does not get better with medicine.  You are unable to pass urine.  You develop a skin rash.  You have a fever.  You have redness around your IV site that gets worse. Get help right away if:  You have difficulty breathing.  You have chest pain.  You have blood in your urine or stool, or you vomit blood. Summary  After the procedure, it is common to have a sore throat or nausea. It is also common to feel tired.  Have a responsible adult stay with you for the first 24 hours after general anesthesia. It is important to have someone help care for you until you are awake and alert.  When you feel hungry, start by eating small amounts of foods that are soft and easy to digest (bland), such as toast. Gradually return to your regular diet.  Drink enough fluid to keep your urine pale yellow.  Return to your normal activities as told by your health care provider. Ask your health care provider what activities are safe for you. This information is not   intended to replace advice given to you by your health care provider. Make sure you discuss any questions you have with your health care provider. Document Revised: 11/23/2017 Document Reviewed: 07/06/2017 Elsevier Patient Education  2020 Elsevier Inc.  

## 2020-07-29 ENCOUNTER — Telehealth: Payer: Self-pay

## 2020-07-29 DIAGNOSIS — U071 COVID-19: Secondary | ICD-10-CM

## 2020-07-29 HISTORY — DX: COVID-19: U07.1

## 2020-07-29 NOTE — Telephone Encounter (Signed)
Patient advised and verbalized understanding. Patient says he has had a mild headache, sneezing and body aches since yesterday. Symptoms have slightly improved today. He plans to stay at home and stay hydrated with fluids.

## 2020-07-29 NOTE — Telephone Encounter (Signed)
-----   Message from Birdie Sons, MD sent at 07/29/2020  9:06 AM EDT ----- Please advise patient his Covid test is positive, so his procedure has to be rescheduled. He shouldn't get ill since he's had his vaccine, but he could still transmit it to people who are unvaccinated, so he should try to avoid public places for 4-62 days, and be very strict about wearing a mask If he does have to go out in public.

## 2020-07-30 ENCOUNTER — Other Ambulatory Visit: Payer: Self-pay

## 2020-07-30 ENCOUNTER — Telehealth: Payer: Self-pay

## 2020-07-30 ENCOUNTER — Ambulatory Visit: Admission: RE | Admit: 2020-07-30 | Payer: Medicare HMO | Source: Home / Self Care | Admitting: Gastroenterology

## 2020-07-30 DIAGNOSIS — D125 Benign neoplasm of sigmoid colon: Secondary | ICD-10-CM

## 2020-07-30 SURGERY — COLONOSCOPY WITH PROPOFOL
Anesthesia: Choice

## 2020-07-30 NOTE — Telephone Encounter (Signed)
He can take OTC mucinex for congestion.

## 2020-07-30 NOTE — Telephone Encounter (Signed)
Left detailed message for patient advising as below. KW

## 2020-07-30 NOTE — Telephone Encounter (Signed)
Message received today from Gilbert from [employer] morning Tisa Weisel- I just spoke with Mr. Tomasik. He would like to have a nurse call him back in regards to his congestion and headache. He said that the tylenol has been working. I think he may want some suggestions as to what he can do for the congestion. Thank you so much.  Please advise.

## 2020-07-30 NOTE — Telephone Encounter (Signed)
Opened in error

## 2020-09-01 ENCOUNTER — Other Ambulatory Visit: Payer: Self-pay

## 2020-09-01 MED ORDER — NA SULFATE-K SULFATE-MG SULF 17.5-3.13-1.6 GM/177ML PO SOLN: 1.0000 | Freq: Once | ORAL | 0 refills | Status: AC

## 2020-09-02 NOTE — Discharge Instructions (Signed)
General Anesthesia, Adult, Care After This sheet gives you information about how to care for yourself after your procedure. Your health care provider may also give you more specific instructions. If you have problems or questions, contact your health care provider. What can I expect after the procedure? After the procedure, the following side effects are common:  Pain or discomfort at the IV site.  Nausea.  Vomiting.  Sore throat.  Trouble concentrating.  Feeling cold or chills.  Weak or tired.  Sleepiness and fatigue.  Soreness and body aches. These side effects can affect parts of the body that were not involved in surgery. Follow these instructions at home:  For at least 24 hours after the procedure:  Have a responsible adult stay with you. It is important to have someone help care for you until you are awake and alert.  Rest as needed.  Do not: ? Participate in activities in which you could fall or become injured. ? Drive. ? Use heavy machinery. ? Drink alcohol. ? Take sleeping pills or medicines that cause drowsiness. ? Make important decisions or sign legal documents. ? Take care of children on your own. Eating and drinking  Follow any instructions from your health care provider about eating or drinking restrictions.  When you feel hungry, start by eating small amounts of foods that are soft and easy to digest (bland), such as toast. Gradually return to your regular diet.  Drink enough fluid to keep your urine pale yellow.  If you vomit, rehydrate by drinking water, juice, or clear broth. General instructions  If you have sleep apnea, surgery and certain medicines can increase your risk for breathing problems. Follow instructions from your health care provider about wearing your sleep device: ? Anytime you are sleeping, including during daytime naps. ? While taking prescription pain medicines, sleeping medicines, or medicines that make you drowsy.  Return to  your normal activities as told by your health care provider. Ask your health care provider what activities are safe for you.  Take over-the-counter and prescription medicines only as told by your health care provider.  If you smoke, do not smoke without supervision.  Keep all follow-up visits as told by your health care provider. This is important. Contact a health care provider if:  You have nausea or vomiting that does not get better with medicine.  You cannot eat or drink without vomiting.  You have pain that does not get better with medicine.  You are unable to pass urine.  You develop a skin rash.  You have a fever.  You have redness around your IV site that gets worse. Get help right away if:  You have difficulty breathing.  You have chest pain.  You have blood in your urine or stool, or you vomit blood. Summary  After the procedure, it is common to have a sore throat or nausea. It is also common to feel tired.  Have a responsible adult stay with you for the first 24 hours after general anesthesia. It is important to have someone help care for you until you are awake and alert.  When you feel hungry, start by eating small amounts of foods that are soft and easy to digest (bland), such as toast. Gradually return to your regular diet.  Drink enough fluid to keep your urine pale yellow.  Return to your normal activities as told by your health care provider. Ask your health care provider what activities are safe for you. This information is not   intended to replace advice given to you by your health care provider. Make sure you discuss any questions you have with your health care provider. Document Revised: 11/23/2017 Document Reviewed: 07/06/2017 Elsevier Patient Education  2020 Elsevier Inc.  

## 2020-09-03 ENCOUNTER — Encounter: Payer: Self-pay | Admitting: Gastroenterology

## 2020-09-06 ENCOUNTER — Other Ambulatory Visit: Payer: Self-pay

## 2020-09-06 ENCOUNTER — Encounter: Payer: Self-pay | Admitting: Gastroenterology

## 2020-09-08 ENCOUNTER — Other Ambulatory Visit: Admission: RE | Admit: 2020-09-08 | Payer: Medicare HMO | Source: Ambulatory Visit

## 2020-09-10 ENCOUNTER — Other Ambulatory Visit: Payer: Self-pay

## 2020-09-10 ENCOUNTER — Ambulatory Visit: Payer: Medicare HMO | Admitting: Anesthesiology

## 2020-09-10 ENCOUNTER — Encounter: Payer: Self-pay | Admitting: Gastroenterology

## 2020-09-10 ENCOUNTER — Ambulatory Visit
Admission: RE | Admit: 2020-09-10 | Discharge: 2020-09-10 | Disposition: A | Discharge status: Home / Self Care | Payer: Medicare HMO | Attending: Gastroenterology | Admitting: Gastroenterology

## 2020-09-10 ENCOUNTER — Encounter
Admission: RE | Disposition: A | Discharge status: Home / Self Care | Payer: Self-pay | Source: Home / Self Care | Attending: Gastroenterology

## 2020-09-10 DIAGNOSIS — Z87891 Personal history of nicotine dependence: Secondary | ICD-10-CM | POA: Diagnosis not present

## 2020-09-10 DIAGNOSIS — Z86010 Personal history of colonic polyps: Secondary | ICD-10-CM | POA: Diagnosis not present

## 2020-09-10 DIAGNOSIS — K640 First degree hemorrhoids: Secondary | ICD-10-CM | POA: Insufficient documentation

## 2020-09-10 DIAGNOSIS — Z79899 Other long term (current) drug therapy: Secondary | ICD-10-CM | POA: Insufficient documentation

## 2020-09-10 DIAGNOSIS — D124 Benign neoplasm of descending colon: Secondary | ICD-10-CM | POA: Insufficient documentation

## 2020-09-10 DIAGNOSIS — Z1211 Encounter for screening for malignant neoplasm of colon: Secondary | ICD-10-CM | POA: Insufficient documentation

## 2020-09-10 DIAGNOSIS — Z8616 Personal history of COVID-19: Secondary | ICD-10-CM | POA: Diagnosis not present

## 2020-09-10 DIAGNOSIS — K635 Polyp of colon: Secondary | ICD-10-CM | POA: Diagnosis not present

## 2020-09-10 DIAGNOSIS — M109 Gout, unspecified: Secondary | ICD-10-CM | POA: Insufficient documentation

## 2020-09-10 DIAGNOSIS — D123 Benign neoplasm of transverse colon: Secondary | ICD-10-CM | POA: Diagnosis not present

## 2020-09-10 HISTORY — PX: COLONOSCOPY WITH PROPOFOL: SHX5780

## 2020-09-10 HISTORY — PX: POLYPECTOMY: SHX5525

## 2020-09-10 SURGERY — COLONOSCOPY WITH PROPOFOL
Anesthesia: General | Site: Rectum

## 2020-09-10 MED ORDER — PROPOFOL 10 MG/ML IV BOLUS
INTRAVENOUS | Status: DC | PRN
  Administered 2020-09-10: 50 mg via INTRAVENOUS
  Administered 2020-09-10: 100 mg via INTRAVENOUS
  Administered 2020-09-10: 50 mg via INTRAVENOUS

## 2020-09-10 MED ORDER — STERILE WATER FOR IRRIGATION IR SOLN
Status: DC | PRN
  Administered 2020-09-10: .05 mL

## 2020-09-10 MED ORDER — LACTATED RINGERS IV SOLN: INTRAVENOUS | Status: DC

## 2020-09-10 MED ORDER — LIDOCAINE HCL (CARDIAC) PF 100 MG/5ML IV SOSY
PREFILLED_SYRINGE | INTRAVENOUS | Status: DC | PRN
  Administered 2020-09-10: 50 mg via INTRAVENOUS

## 2020-09-10 SURGICAL SUPPLY — 7 items
FORCEPS BIOP RAD 4 LRG CAP 4 (CUTTING FORCEPS) ×4 IMPLANT
GOWN CVR UNV OPN BCK APRN NK (MISCELLANEOUS) ×4 IMPLANT
GOWN ISOL THUMB LOOP REG UNIV (MISCELLANEOUS) ×8
KIT PRC NS LF DISP ENDO (KITS) ×2 IMPLANT
KIT PROCEDURE OLYMPUS (KITS) ×4
MANIFOLD NEPTUNE II (INSTRUMENTS) ×4 IMPLANT
WATER STERILE IRR 250ML POUR (IV SOLUTION) ×4 IMPLANT

## 2020-09-10 NOTE — H&P (Signed)
Lucilla Lame, MD [address] [place] [place], [place] [address] Phone:[phone] Fax : [phone]  Primary Care Physician:  Birdie Sons, MD Primary Gastroenterologist:  Dr. Allen Norris  Pre-Procedure History & Physical: HPI:  Michael Hicks is a 74 y.o. male is here for an colonoscopy.   Past Medical History:  Diagnosis Date  . COVID-19 07/29/2020   HX  . Erectile dysfunction   . Gout   . Hx of colonic polyps   . SOB (shortness of breath)     Past Surgical History:  Procedure Laterality Date  . COLONOSCOPY    . COLONOSCOPY WITH PROPOFOL N/A 01/14/2016   Procedure: COLONOSCOPY WITH PROPOFOL;  Surgeon: Lucilla Lame, MD;  Location: [facility];  Service: Endoscopy;  Laterality: N/A;  . COLONOSCOPY WITH PROPOFOL N/A 07/26/2017   Procedure: COLONOSCOPY WITH PROPOFOL;  Surgeon: Lucilla Lame, MD;  Location: [facility];  Service: Gastroenterology;  Laterality: N/A;  . POLYPECTOMY  01/14/2016   Procedure: POLYPECTOMY;  Surgeon: Lucilla Lame, MD;  Location: [facility];  Service: Endoscopy;;    Prior to Admission medications   Medication Sig Start Date End Date Taking? Authorizing Provider  colchicine (COLCRYS) 0.6 MG tablet TAKE 2 TABLETS BY MOUTH TODAY, THEN 1 TABLET DAILY Patient taking differently: as needed. TAKE 2 TABLETS BY MOUTH TODAY, THEN 1 TABLET DAILY 04/02/20  Yes Birdie Sons, MD  Multiple Vitamins-Minerals (MULTIVITAMIN WITH MINERALS) tablet Take 1 tablet by mouth daily.   Yes [provider]  naproxen sodium (ALEVE) 220 MG tablet Take 220 mg by mouth daily as needed.   Yes [provider]  sildenafil (REVATIO) 20 MG tablet TAKE TWO AND A HALF (2.5) TABLETS BY MOUTH 30 MINUTES BEFORE SEXUAL INTERCOURSE 01/23/18  Yes Birdie Sons, MD  budesonide-formoterol [facility]) 160-4.5 MCG/ACT inhaler Inhale 2 puffs into the lungs 2 (two) times daily. Patient not taking: Reported on 07/22/2020 04/02/20   Birdie Sons, MD      Allergies as of 07/30/2020  . (No Known Allergies)    Family History  Problem Relation Age of Onset  . COPD Mother   . Heart disease Mother   . Hypertension Father   . Cancer Brother        bone cancer    Social History   Socioeconomic History  . Marital status: Divorced    Spouse name: Not on file  . Number of children: 1  . Years of education: 68  . Highest education level: Associate degree: occupational, Hotel manager, or vocational program  Occupational History  . Occupation: Finance    Comment: RETIRED  Tobacco Use  . Smoking status: Former Smoker    Packs/day: 1.00    Years: 15.00    Pack years: 15.00    Types: Cigarettes    Quit date: 12/04/1990    Years since quitting: 29.7  . Smokeless tobacco: Never Used  Vaping Use  . Vaping Use: Never used  Substance and Sexual Activity  . Alcohol use: Yes    Alcohol/week: 14.0 - 21.0 standard drinks    Types: 14 - 21 Cans of beer per week    Comment: 2-3 beers a night  . Drug use: No  . Sexual activity: Yes  Other Topics Concern  . Not on file  Social History Narrative   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    Social Determinants of Health   Financial Resource Strain: Low Risk   . Difficulty of Paying Living Expenses: Not  hard at all  Food Insecurity: No Food Insecurity  . Worried About Charity fundraiser in the Last Year: Never true  . Ran Out of Food in the Last Year: Never true  Transportation Needs: No Transportation Needs  . Lack of Transportation (Medical): No  . Lack of Transportation (Non-Medical): No  Physical Activity: Sufficiently Active  . Days of Exercise per Week: 7 days  . Minutes of Exercise per Session: 40 min  Stress: No Stress Concern Present  . Feeling of Stress : Not at all  Social Connections: Moderately Integrated  . Frequency of Communication with Friends and Family: More than three times a week  . Frequency of Social Gatherings with Friends and Family: More than three times a  week  . Attends Religious Services: More than 4 times per year  . Active Member of Clubs or Organizations: Yes  . Attends Archivist Meetings: More than 4 times per year  . Marital Status: Divorced  Human resources officer Violence: Not At Risk  . Fear of Current or Ex-Partner: No  . Emotionally Abused: No  . Physically Abused: No  . Sexually Abused: No    Review of Systems: See HPI, otherwise negative ROS  Physical Exam: BP 123/70   Pulse 64   Temp (!) 97.5 F (36.4 C)   Ht 6' (1.829 m)   Wt 81.2 kg   SpO2 99%   BMI 24.28 kg/m  General:   Alert,  pleasant and cooperative in NAD Head:  Normocephalic and atraumatic. Neck:  Supple; no masses or thyromegaly. Lungs:  Clear throughout to auscultation.    Heart:  Regular rate and rhythm. Abdomen:  Soft, nontender and nondistended. Normal bowel sounds, without guarding, and without rebound.   Neurologic:  Alert and  oriented x4;  grossly normal neurologically.  Impression/Plan: Michael Hicks is here for an colonoscopy to be performed for a history of adenomatous polyps on 2018   Risks, benefits, limitations, and alternatives regarding  colonoscopy have been reviewed with the patient.  Questions have been answered.  All parties agreeable.   Lucilla Lame, MD  09/10/2020, 9:01 AM

## 2020-09-10 NOTE — Anesthesia Procedure Notes (Signed)
Procedure Name: General with mask airway Date/Time: 09/10/2020 9:15 AM Performed by: Jeannene Patella, CRNA Pre-anesthesia Checklist: Patient identified, Emergency Drugs available, Suction available, Patient being monitored and Timeout performed Patient Re-evaluated:Patient Re-evaluated prior to induction Oxygen Delivery Method: Nasal cannula

## 2020-09-10 NOTE — Anesthesia Postprocedure Evaluation (Signed)
Anesthesia Post Note  Patient: Michael Hicks  Procedure(s) Performed: COLONOSCOPY WITH PROPOFOL (N/A Rectum) POLYPECTOMY (Rectum)     Patient location during evaluation: PACU Anesthesia Type: General Level of consciousness: awake and alert Pain management: pain level controlled Vital Signs Assessment: post-procedure vital signs reviewed and stable Respiratory status: spontaneous breathing, nonlabored ventilation, respiratory function stable and patient connected to nasal cannula oxygen Cardiovascular status: blood pressure returned to baseline and stable Postop Assessment: no apparent nausea or vomiting Anesthetic complications: no   No complications documented.  Adele Barthel Salaya [place]

## 2020-09-10 NOTE — Transfer of Care (Signed)
Immediate Anesthesia Transfer of Care Note  Patient: RUGER SAXER  Procedure(s) Performed: COLONOSCOPY WITH PROPOFOL (N/A Rectum) POLYPECTOMY (Rectum)  Patient Location: PACU  Anesthesia Type: General  Level of Consciousness: awake, alert  and patient cooperative  Airway and Oxygen Therapy: Patient Spontanous Breathing and Patient connected to supplemental oxygen  Post-op Assessment: Post-op Vital signs reviewed, Patient's Cardiovascular Status Stable, Respiratory Function Stable, Patent Airway and No signs of Nausea or vomiting  Post-op Vital Signs: Reviewed and stable  Complications: No complications documented.

## 2020-09-10 NOTE — Op Note (Signed)
[facility] Patient Name: Michael Hicks Procedure Date: 09/10/2020 8:52 AM MRN: [id number] Account #: [id number] Date of Birth: 03-20-1946 Admit Type: Outpatient Age: 74 Room: [facility] OR ROOM 01 Gender: Male Note Status: Finalized Procedure:             Colonoscopy Indications:           High risk colon cancer surveillance: Personal history                         of colonic polyps Providers:             Lucilla Lame MD, MD Referring MD:          Kirstie Peri. Caryn Section, MD (Referring MD) Medicines:             Propofol per Anesthesia Complications:         No immediate complications. Procedure:             Pre-Anesthesia Assessment:                        - Prior to the procedure, a History and Physical was                         performed, and patient medications and allergies were                         reviewed. The patient's tolerance of previous                         anesthesia was also reviewed. The risks and benefits                         of the procedure and the sedation options and risks                         were discussed with the patient. All questions were                         answered, and informed consent was obtained. Prior                         Anticoagulants: The patient has taken no previous                         anticoagulant or antiplatelet agents. ASA Grade                         Assessment: II - A patient with mild systemic disease.                         After reviewing the risks and benefits, the patient                         was deemed in satisfactory condition to undergo the                         procedure.  After obtaining informed consent, the colonoscope was                         passed under direct vision. Throughout the procedure,                         the patient's blood pressure, pulse, and oxygen                         saturations were monitored continuously. The was                          introduced through the anus and advanced to the the                         cecum, identified by appendiceal orifice and ileocecal                         valve. The colonoscopy was performed without                         difficulty. The patient tolerated the procedure well.                         The quality of the bowel preparation was excellent. Findings:      The perianal and digital rectal examinations were normal.      Two sessile polyps were found in the transverse colon. The polyps were 3       to 4 mm in size. These polyps were removed with a cold biopsy forceps.       Resection and retrieval were complete.      Three sessile polyps were found in the descending colon. The polyps were       3 to 4 mm in size. These polyps were removed with a cold biopsy forceps.       Resection and retrieval were complete.      Non-bleeding internal hemorrhoids were found during retroflexion. The       hemorrhoids were Grade I (internal hemorrhoids that do not prolapse). Impression:            - Two 3 to 4 mm polyps in the transverse colon,                         removed with a cold biopsy forceps. Resected and                         retrieved.                        - Three 3 to 4 mm polyps in the descending colon,                         removed with a cold biopsy forceps. Resected and                         retrieved.                        - Non-bleeding internal hemorrhoids. Recommendation:        -  Discharge patient to home.                        - Resume previous diet.                        - Continue present medications.                        - Await pathology results. Procedure Code(s):     --- Professional ---                        [phone], Colonoscopy, flexible; with biopsy, single or                         multiple Diagnosis Code(s):     --- Professional ---                        Z86.010, Personal history of colonic polyps                        K63.5,  Polyp of colon CPT copyright 2019 [employer]. All rights reserved. The codes documented in this report are preliminary and upon coder review may  be revised to meet current compliance requirements. Lucilla Lame MD, MD 09/10/2020 9:27:06 AM This report has been signed electronically. Number of Addenda: 0 Note Initiated On: 09/10/2020 8:52 AM Scope Withdrawal Time: 0 hours 8 minutes 57 seconds  Total Procedure Duration: 0 hours 11 minutes 35 seconds  Estimated Blood Loss:  Estimated blood loss: none.      [facility]

## 2020-09-10 NOTE — Anesthesia Preprocedure Evaluation (Signed)
Anesthesia Evaluation  Patient identified by MRN, date of birth, ID band Patient awake    History of Anesthesia Complications Negative for: history of anesthetic complications  Airway Mallampati: II  TM Distance: >3 FB Neck ROM: Full    Dental no notable dental hx.    Pulmonary former smoker,    Pulmonary exam normal        Cardiovascular Exercise Tolerance: Good negative cardio ROS Normal cardiovascular exam     Neuro/Psych negative neurological ROS     GI/Hepatic negative GI ROS, Neg liver ROS,   Endo/Other  negative endocrine ROS  Renal/GU negative Renal ROS     Musculoskeletal   Abdominal   Peds  Hematology negative hematology ROS (+)   Anesthesia Other Findings   Reproductive/Obstetrics                            Anesthesia Physical Anesthesia Plan  ASA: I  Anesthesia Plan: General   Post-op Pain Management:    Induction: Intravenous  PONV Risk Score and Plan: 2 and Propofol infusion, Treatment may vary due to age or medical condition and TIVA  Airway Management Planned: Nasal Cannula and Natural Airway  Additional Equipment: None  Intra-op Plan:   Post-operative Plan:   Informed Consent: I have reviewed the patients History and Physical, chart, labs and discussed the procedure including the risks, benefits and alternatives for the proposed anesthesia with the patient or authorized representative who has indicated his/her understanding and acceptance.       Plan Discussed with: CRNA  Anesthesia Plan Comments:         Anesthesia Quick Evaluation

## 2020-09-13 ENCOUNTER — Encounter: Payer: Self-pay | Admitting: Gastroenterology

## 2020-09-13 LAB — SURGICAL PATHOLOGY

## 2020-09-14 ENCOUNTER — Encounter: Payer: Self-pay | Admitting: Gastroenterology

## 2020-10-18 ENCOUNTER — Other Ambulatory Visit: Payer: Self-pay

## 2020-10-18 ENCOUNTER — Ambulatory Visit: Payer: Medicare HMO | Admitting: Dermatology

## 2020-10-18 DIAGNOSIS — L814 Other melanin hyperpigmentation: Secondary | ICD-10-CM

## 2020-10-18 DIAGNOSIS — L578 Other skin changes due to chronic exposure to nonionizing radiation: Secondary | ICD-10-CM

## 2020-10-18 DIAGNOSIS — L570 Actinic keratosis: Secondary | ICD-10-CM

## 2020-10-18 DIAGNOSIS — Z1283 Encounter for screening for malignant neoplasm of skin: Secondary | ICD-10-CM

## 2020-10-18 DIAGNOSIS — L821 Other seborrheic keratosis: Secondary | ICD-10-CM

## 2020-10-18 NOTE — Progress Notes (Signed)
   Follow-Up Visit   Subjective  Michael Hicks is a 74 y.o. male who presents for the following: Follow-up (UBSE today. Hx AKs.). He has noticed a few rough spots on his temples.   The following portions of the chart were reviewed this encounter and updated as appropriate:      Review of Systems:  No other skin or systemic complaints except as noted in HPI or Assessment and Plan.  Objective  Well appearing patient in no apparent distress; mood and affect are within normal limits.  All skin waist up examined.  Objective  L glabella x 1, L temple x 2, R temple x 3, R cheek x 1 (7): Erythematous thin papules/macules with gritty scale.    Assessment & Plan   Skin cancer screening performed today.  Actinic Damage - chronic, secondary to cumulative UV radiation exposure/sun exposure over time - diffuse scaly erythematous macules with underlying dyspigmentation - Recommend daily broad spectrum sunscreen SPF 30+ to sun-exposed areas, reapply every 2 hours as needed.  - Call for new or changing lesions. - Discussed "Field Treatment" for Severe, Confluent Actinic Changes with Pre-Cancerous Actinic Keratoses due to cumulative sun exposure/UV radiation exposure over time Field treatment involves treatment of an entire area of skin that has confluent Actinic Changes (Sun/ Ultraviolet light damage) and PreCancerous Actinic Keratoses by method of PhotoDynamic Therapy (PDT) and/or prescription Topical Chemotherapy agents such as 5-fluorouracil, 5-fluorouracil/calcipotriene, and/or imiquimod.  The purpose is to decrease the number of clinically evident and subclinical PreCancerous lesions to prevent progression to development of skin cancer by chemically destroying early precancer changes that may or may not be visible.  It has been shown to reduce the risk of developing skin cancer in the treated area. As a result of treatment, redness, scaling, crusting, and open sores may occur during treatment  course. One or more than one of these methods may be used and may have to be used several times to control, suppress and eliminate the PreCancerous changes. Discussed treatment course, expected reaction, and possible side effects.  Lentigines - Scattered tan macules - Discussed due to sun exposure - Benign, observe - Call for any changes  Seborrheic Keratoses - Stuck-on, waxy, tan-brown papules and plaques  - Discussed benign etiology and prognosis. - Observe - Call for any changes   AK (actinic keratosis) (7) L glabella x 1, L temple x 2, R temple x 3, R cheek x 1  Destruction of lesion - L glabella x 1, L temple x 2, R temple x 3, R cheek x 1  Destruction method: cryotherapy   Informed consent: discussed and consent obtained   Lesion destroyed using liquid nitrogen: Yes   Region frozen until ice ball extended beyond lesion: Yes   Outcome: patient tolerated procedure well with no complications   Post-procedure details: wound care instructions given    Return in about 1 year (around 10/18/2021) for UBSE. Also PDT to face x 2, 1 month apart.   IJamesetta Orleans, CMA, am acting as scribe for Brendolyn Patty, MD .  Documentation: I have reviewed the above documentation for accuracy and completeness, and I agree with the above.  Brendolyn Patty MD

## 2020-10-18 NOTE — Patient Instructions (Addendum)
Cryotherapy Aftercare  . Wash gently with soap and water everyday.   [name] Apply Vaseline and Band-Aid daily until healed.   - Discussed "Field Treatment" for Severe, Confluent Actinic Changes with Pre-Cancerous Actinic Keratoses due to cumulative sun exposure/UV radiation exposure over time Field treatment involves treatment of an entire area of skin that has confluent Actinic Changes (Sun/ Ultraviolet light damage) and PreCancerous Actinic Keratoses by method of PhotoDynamic Therapy (PDT) and/or prescription Topical Chemotherapy agents such as 5-fluorouracil, 5-fluorouracil/calcipotriene, and/or imiquimod.  The purpose is to decrease the number of clinically evident and subclinical PreCancerous lesions to prevent progression to development of skin cancer by chemically destroying early precancer changes that may or may not be visible.  It has been shown to reduce the risk of developing skin cancer in the treated area. As a result of treatment, redness, scaling, crusting, and open sores may occur during treatment course. One or more than one of these methods may be used and may have to be used several times to control, suppress and eliminate the PreCancerous changes. Discussed treatment course, expected reaction, and possible side effects.

## 2020-12-09 ENCOUNTER — Ambulatory Visit: Payer: Medicare HMO

## 2020-12-15 ENCOUNTER — Other Ambulatory Visit: Payer: Self-pay | Admitting: Family Medicine

## 2020-12-15 DIAGNOSIS — N529 Male erectile dysfunction, unspecified: Secondary | ICD-10-CM

## 2020-12-15 MED ORDER — SILDENAFIL CITRATE 20 MG PO TABS: ORAL_TABLET | ORAL | 0 refills | Status: DC

## 2020-12-15 NOTE — Telephone Encounter (Signed)
Copied from [place] [id number]. Topic: Quick Communication - Rx Refill/Question >> Dec 15, 2020  4:33 PM Wynetta Emery, [place] C wrote: Medication: sildenafil (REVATIO) 20 MG tablet  Has the patient contacted their pharmacy? Yes  (Agent: If no, request that the patient contact the pharmacy for the refill.) (Agent: If yes, when and what did the pharmacy advise?)  Preferred Pharmacy (with phone number or street name): [id number] - [place], [place]  Phone:  [phone] Fax:  [phone]   Agent: Please be advised that RX refills may take up to 3 business days. We ask that you follow-up with your pharmacy.

## 2020-12-16 ENCOUNTER — Ambulatory Visit: Payer: Medicare HMO

## 2020-12-23 ENCOUNTER — Telehealth: Payer: Self-pay

## 2020-12-23 NOTE — Telephone Encounter (Signed)
Copied from [place] [id number]. Topic: Appointment Scheduling - Scheduling Inquiry for Clinic >> Dec 23, 2020 10:26 AM Oneta Rack wrote: Reason for CRM: patient would like his AWV scheduled for 01/04/2021 to be a telephone visit, please confirm with patient if that is ok

## 2020-12-28 ENCOUNTER — Other Ambulatory Visit: Payer: Self-pay

## 2020-12-28 ENCOUNTER — Ambulatory Visit: Payer: Medicare HMO

## 2020-12-28 DIAGNOSIS — L570 Actinic keratosis: Secondary | ICD-10-CM

## 2020-12-28 MED ORDER — AMINOLEVULINIC ACID HCL 20 % EX SOLR
1.0000 "application " | Freq: Once | CUTANEOUS | Status: AC
  Administered 2020-12-28: 354 mg via TOPICAL

## 2020-12-28 NOTE — Patient Instructions (Signed)

## 2020-12-28 NOTE — Progress Notes (Signed)
1. AK (actinic keratosis) Head - Anterior (Face)  Photodynamic therapy - Head - Anterior (Face) Procedure discussed: discussed risks, benefits, side effects. and alternatives   Prep: site scrubbed/prepped with acetone   Location:  Face Number of lesions:  Multiple Type of treatment:  Blue light Aminolevulinic Acid (see MAR for details): Levulan Number of Levulan sticks used:  1 Incubation time (minutes):  60 Number of minutes under lamp:  16 Number of seconds under lamp:  40 Cooling:  Floor fan Outcome: patient tolerated procedure well with no complications   Post-procedure details: sunscreen applied and aftercare instructions given to patient    Aminolevulinic Acid HCl 20 % SOLR 354 mg - Head - Anterior (Face)      

## 2021-01-03 NOTE — Progress Notes (Signed)
Subjective:   Michael Hicks is a 75 y.o. male who presents for Medicare Annual/Subsequent preventive examination.  I connected with Michael Hicks today by telephone and verified that I am speaking with the correct person using two identifiers. Location patient: home Location provider: work Persons participating in the virtual visit: patient, provider.   I discussed the limitations, risks, security and privacy concerns of performing an evaluation and management service by telephone and the availability of in person appointments. I also discussed with the patient that there may be a patient responsible charge related to this service. The patient expressed understanding and verbally consented to this telephonic visit.    Interactive audio and video telecommunications were attempted between this provider and patient, however failed, due to patient having technical difficulties OR patient did not have access to video capability.  We continued and completed visit with audio only.   Review of Systems    N/A  Cardiac Risk Factors include: advanced age (>66men, >5 women);male gender     Objective:    There were no vitals filed for this visit. There is no height or weight on file to calculate BMI.  Advanced Directives 01/04/2021 09/10/2020 12/31/2019 12/27/2018 12/17/2017 07/26/2017 12/13/2016  Does Patient Have a Medical Advance Directive? No Yes Yes No No Yes No  Type of Advance Directive - Healthcare Power of Roff;Living will - - - -  Does patient want to make changes to medical advance directive? - No - Patient declined - Yes (MAU/Ambulatory/Procedural Areas - Information given) - Yes (MAU/Ambulatory/Procedural Areas - Information given) -  Copy of [place] in Chart? - Yes - validated most recent copy scanned in chart (See row information) No - copy requested - - - -  Would patient like information on creating a medical advance directive?  Yes (ED - Information included in AVS) No - Patient declined - - Yes (MAU/Ambulatory/Procedural Areas - Information given) - Yes (MAU/Ambulatory/Procedural Areas - Information given)    Current Medications (verified) Outpatient Encounter Medications as of 01/04/2021  Medication Sig  . budesonide-formoterol (SYMBICORT) 160-4.5 MCG/ACT inhaler Inhale 2 puffs into the lungs 2 (two) times daily.  . colchicine (COLCRYS) 0.6 MG tablet TAKE 2 TABLETS BY MOUTH TODAY, THEN 1 TABLET DAILY (Patient taking differently: as needed. TAKE 2 TABLETS BY MOUTH TODAY, THEN 1 TABLET DAILY)  . Multiple Vitamins-Minerals (MULTIVITAMIN WITH MINERALS) tablet Take 1 tablet by mouth daily.  . naproxen sodium (ALEVE) 220 MG tablet Take 220 mg by mouth daily as needed.  . sildenafil (REVATIO) 20 MG tablet TAKE TWO AND A HALF (2.5) TABLETS BY MOUTH 30 MINUTES BEFORE SEXUAL INTERCOURSE   No facility-administered encounter medications on file as of 01/04/2021.    Allergies (verified) Patient has no known allergies.   History: Past Medical History:  Diagnosis Date  . COVID-19 07/29/2020   HX  . Erectile dysfunction   . Gout   . Hx of colonic polyps   . Precancerous skin lesion   . SOB (shortness of breath)    Past Surgical History:  Procedure Laterality Date  . COLONOSCOPY    . COLONOSCOPY WITH PROPOFOL N/A 01/14/2016   Procedure: COLONOSCOPY WITH PROPOFOL;  Surgeon: Lucilla Lame, MD;  Location: [place];  Service: Endoscopy;  Laterality: N/A;  . COLONOSCOPY WITH PROPOFOL N/A 07/26/2017   Procedure: COLONOSCOPY WITH PROPOFOL;  Surgeon: Lucilla Lame, MD;  Location: [place];  Service: Gastroenterology;  Laterality: N/A;  . COLONOSCOPY WITH PROPOFOL  N/A 09/10/2020   Procedure: COLONOSCOPY WITH PROPOFOL;  Surgeon: Lucilla Lame, MD;  Location: [place];  Service: Endoscopy;  Laterality: N/A;  priority 4 COVID (+) 07/28/20  . POLYPECTOMY  01/14/2016   Procedure: POLYPECTOMY;  Surgeon: Lucilla Lame, MD;  Location: [place];  Service: Endoscopy;;  . POLYPECTOMY  09/10/2020   Procedure: POLYPECTOMY;  Surgeon: Lucilla Lame, MD;  Location: [place];  Service: Endoscopy;;   Family History  Problem Relation Age of Onset  . COPD Mother   . Heart disease Mother   . Hypertension Father   . Cancer Brother        bone cancer   Social History   Socioeconomic History  . Marital status: Divorced    Spouse name: Not on file  . Number of children: 1  . Years of education: 69  . Highest education level: Associate degree: occupational, Hotel manager, or vocational program  Occupational History  . Occupation: Finance    Comment: RETIRED  Tobacco Use  . Smoking status: Former Smoker    Packs/day: 1.00    Years: 15.00    Pack years: 15.00    Types: Cigarettes    Quit date: 12/04/1990    Years since quitting: 30.1  . Smokeless tobacco: Never Used  Vaping Use  . Vaping Use: Never used  Substance and Sexual Activity  . Alcohol use: Yes    Alcohol/week: 14.0 - 21.0 standard drinks    Types: 14 - 21 Cans of beer per week    Comment: 2-3 beers a night  . Drug use: No  . Sexual activity: Yes  Other Topics Concern  . Not on file  Social History Narrative   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    Social Determinants of Health   Financial Resource Strain: Low Risk   . Difficulty of Paying Living Expenses: Not hard at all  Food Insecurity: No Food Insecurity  . Worried About Charity fundraiser in the Last Year: Never true  . Ran Out of Food in the Last Year: Never true  Transportation Needs: No Transportation Needs  . Lack of Transportation (Medical): No  . Lack of Transportation (Non-Medical): No  Physical Activity: Sufficiently Active  . Days of Exercise per Week: 4 days  . Minutes of Exercise per Session: 40 min  Stress: No Stress Concern Present  . Feeling of Stress : Not at all  Social Connections: Moderately Integrated  . Frequency of Communication  with Friends and Family: More than three times a week  . Frequency of Social Gatherings with Friends and Family: More than three times a week  . Attends Religious Services: More than 4 times per year  . Active Member of Clubs or Organizations: Yes  . Attends Archivist Meetings: More than 4 times per year  . Marital Status: Divorced    Tobacco Counseling Counseling given: Not Answered   Clinical Intake:  Pre-visit preparation completed: Yes  Pain : No/denies pain     Nutritional Risks: None Diabetes: No  How often do you need to have someone help you when you read instructions, pamphlets, or other written materials from your doctor or pharmacy?: 1 - Never  Diabetic? No  Interpreter Needed?: No  Information entered by :: [facility], LPN   Activities of Daily Living In your present state of health, do you have any difficulty performing the following activities: 01/04/2021 09/10/2020  Hearing? N N  Vision? N N  Difficulty concentrating or  making decisions? N N  Walking or climbing stairs? N N  Dressing or bathing? N N  Doing errands, shopping? N -  Preparing Food and eating ? N -  Using the Toilet? N -  In the past six months, have you accidently leaked urine? N -  Do you have problems with loss of bowel control? N -  Managing your Medications? N -  Managing your Finances? N -  Housekeeping or managing your Housekeeping? N -  Some recent data might be hidden    Patient Care Team: Birdie Sons, MD as PCP - General (Family Medicine) Idelle Leech, OD as Consulting Physician (Optometry) Brendolyn Patty, MD (Dermatology) Lucilla Lame, MD as Consulting Physician (Gastroenterology)  Indicate any recent Medical Services you may have received from other than [facility] providers in the past year (date may be approximate).     Assessment:   This is a routine wellness examination for Michael Hicks.  Hearing/Vision screen No exam data present  Dietary issues and  exercise activities discussed: Current Exercise Habits: Home exercise routine, Type of exercise: walking, Time (Minutes): 45, Frequency (Times/Week): 4, Weekly Exercise (Minutes/Week): 180, Intensity: Mild, Exercise limited by: None identified  Goals    . DIET - INCREASE WATER INTAKE     Recommend to drink at least 6-8 8oz glasses of water per day.      Depression Screen PHQ 2/9 Scores 01/04/2021 12/31/2019 12/31/2019 12/27/2018 12/27/2018 12/17/2017 12/17/2017  PHQ - 2 Score 0 0 0 0 0 0 0  PHQ- 9 Score - - - 0 - 0 -    Fall Risk Fall Risk  01/04/2021 04/02/2020 12/31/2019 12/27/2018 12/17/2017  Falls in the past year? 0 0 0 1 Yes  Number falls in past yr: 0 0 0 0 1  Injury with Fall? 0 0 0 0 No  Follow up - Falls evaluation completed - Falls prevention discussed Falls prevention discussed    FALL RISK PREVENTION PERTAINING TO THE HOME:  Any stairs in or around the home? Yes  If so, are there any without handrails? No  Home free of loose throw rugs in walkways, pet beds, electrical cords, etc? Yes  Adequate lighting in your home to reduce risk of falls? Yes   ASSISTIVE DEVICES UTILIZED TO PREVENT FALLS:  Life alert? No  Use of a cane, walker or w/c? No  Grab bars in the bathroom? No  Shower chair or bench in shower? No  Elevated toilet seat or a handicapped toilet? No    Cognitive Function:     6CIT Screen 12/27/2018 12/17/2017  What Year? 0 points 0 points  What month? 0 points 0 points  What time? 0 points 0 points  Count back from 20 0 points 0 points  Months in reverse 0 points 0 points  Repeat phrase 0 points 4 points  Total Score 0 4    Immunizations Immunization History  Administered Date(s) Administered  . Influenza, High Dose Seasonal PF 10/20/2016, 09/28/2017, 09/03/2018, 09/11/2020  . Influenza,inj,Quad PF,6+ Mos 12/01/2015  . Influenza-Unspecified 09/28/2017, 09/05/2019  . PFIZER(Purple Top)SARS-COV-2 Vaccination 01/27/2020, 02/17/2020, 09/13/2020  . Pneumococcal  Conjugate-13 11/24/2013  . Pneumococcal Polysaccharide-23 11/25/2014  . Td 11/22/2012  . Tdap 11/22/2012  . Zoster 11/22/2012  . Zoster Recombinat (Shingrix) 09/05/2019    TDAP status: Up to date  Flu Vaccine status: Up to date  Pneumococcal vaccine status: Up to date  Covid-19 vaccine status: Completed vaccines  Qualifies for Shingles Vaccine? Yes   Zostavax completed Yes  Shingrix Completed?: Yes  Screening Tests Health Maintenance  Topic Date Due  . COVID-19 Vaccine (4 - Booster for Pfizer series) 03/14/2021  . TETANUS/TDAP  11/22/2022  . COLONOSCOPY (Pts 45-29yrs Insurance coverage will need to be confirmed)  09/10/2025  . INFLUENZA VACCINE  Completed  . Hepatitis C Screening  Completed  . PNA vac Low Risk Adult  Completed    Health Maintenance  There are no preventive care reminders to display for this patient.  Colorectal cancer screening: Type of screening: Colonoscopy. Completed 09/10/20. Repeat every 5 years  Lung Cancer Screening: (Low Dose CT Chest recommended if Age 68-80 years, 30 pack-year currently smoking OR have quit w/in 15years.) does not qualify.   Additional Screening:  Hepatitis C Screening: Up to date  Vision Screening: Recommended annual ophthalmology exams for early detection of glaucoma and other disorders of the eye. Is the patient up to date with their annual eye exam?  Yes  Who is the provider or what is the name of the office in which the patient attends annual eye exams? Dr Matilde Sprang  If pt is not established with a provider, would they like to be referred to a provider to establish care? No .   Dental Screening: Recommended annual dental exams for proper oral hygiene  Community Resource Referral / Chronic Care Management: CRR required this visit?  No   CCM required this visit?  No      Plan:     I have personally reviewed and noted the following in the patient's chart:   . Medical and social history . Use of alcohol, tobacco or  illicit drugs  . Current medications and supplements . Functional ability and status . Nutritional status . Physical activity . Advanced directives . List of other physicians . Hospitalizations, surgeries, and ER visits in previous 12 months . Vitals . Screenings to include cognitive, depression, and falls . Referrals and appointments  In addition, I have reviewed and discussed with patient certain preventive protocols, quality metrics, and best practice recommendations. A written personalized care plan for preventive services as well as general preventive health recommendations were provided to patient.     Michael Hicks, [place]   [id number]   Nurse Notes: Requested second Shingrix dose information to up date in chart.

## 2021-01-04 ENCOUNTER — Other Ambulatory Visit: Payer: Self-pay

## 2021-01-04 ENCOUNTER — Ambulatory Visit (INDEPENDENT_AMBULATORY_CARE_PROVIDER_SITE_OTHER): Payer: Medicare HMO

## 2021-01-04 DIAGNOSIS — Z0000 Encounter for general adult medical examination without abnormal findings: Secondary | ICD-10-CM

## 2021-01-04 NOTE — Patient Instructions (Signed)
Michael Hicks , Thank you for taking time to come for your Medicare Wellness Visit. I appreciate your ongoing commitment to your health goals. Please review the following plan we discussed and let me know if I can assist you in the future.   Screening recommendations/referrals: Colonoscopy: Up to date, due 09/2025 Recommended yearly ophthalmology/optometry visit for glaucoma screening and checkup Recommended yearly dental visit for hygiene and checkup  Vaccinations: Influenza vaccine: Done 09/11/20 Pneumococcal vaccine: Completed series Tdap vaccine: Up to date, due 11/2022 Shingles vaccine: Completed series    Advanced directives: Advance directive discussed with you today. I will mail you a copy to complete at home and have notarized. Once this is complete please bring a copy in to our office so we can scan it into your chart.  Conditions/risks identified: Recommend to increase water intake to 6-8 8 oz glasses a day.   Next appointment: 04/20/21 @ 10:00 AM with Dr Caryn Section   Preventive Care 73 Years and Older, Male Preventive care refers to lifestyle choices and visits with your health care provider that can promote health and wellness. What does preventive care include?  A yearly physical exam. This is also called an annual well check.  Dental exams once or twice a year.  Routine eye exams. Ask your health care provider how often you should have your eyes checked.  Personal lifestyle choices, including:  Daily care of your teeth and gums.  Regular physical activity.  Eating a healthy diet.  Avoiding tobacco and drug use.  Limiting alcohol use.  Practicing safe sex.  Taking low doses of aspirin every day.  Taking vitamin and mineral supplements as recommended by your health care provider. What happens during an annual well check? The services and screenings done by your health care provider during your annual well check will depend on your age, overall health, lifestyle risk  factors, and family history of disease. Counseling  Your health care provider may ask you questions about your:  Alcohol use.  Tobacco use.  Drug use.  Emotional well-being.  Home and relationship well-being.  Sexual activity.  Eating habits.  History of falls.  Memory and ability to understand (cognition).  Work and work Statistician. Screening  You may have the following tests or measurements:  Height, weight, and BMI.  Blood pressure.  Lipid and cholesterol levels. These may be checked every 5 years, or more frequently if you are over 63 years old.  Skin check.  Lung cancer screening. You may have this screening every year starting at age 72 if you have a 30-pack-year history of smoking and currently smoke or have quit within the past 15 years.  Fecal occult blood test (FOBT) of the stool. You may have this test every year starting at age 73.  Flexible sigmoidoscopy or colonoscopy. You may have a sigmoidoscopy every 5 years or a colonoscopy every 10 years starting at age 73.  Prostate cancer screening. Recommendations will vary depending on your family history and other risks.  Hepatitis C blood test.  Hepatitis B blood test.  Sexually transmitted disease (STD) testing.  Diabetes screening. This is done by checking your blood sugar (glucose) after you have not eaten for a while (fasting). You may have this done every 1-3 years.  Abdominal aortic aneurysm (AAA) screening. You may need this if you are a current or former smoker.  Osteoporosis. You may be screened starting at age 73 if you are at high risk. Talk with your health care provider about your  test results, treatment options, and if necessary, the need for more tests. Vaccines  Your health care provider may recommend certain vaccines, such as:  Influenza vaccine. This is recommended every year.  Tetanus, diphtheria, and acellular pertussis (Tdap, Td) vaccine. You may need a Td booster every 10  years.  Zoster vaccine. You may need this after age 33.  Pneumococcal 13-valent conjugate (PCV13) vaccine. One dose is recommended after age 60.  Pneumococcal polysaccharide (PPSV23) vaccine. One dose is recommended after age 61. Talk to your health care provider about which screenings and vaccines you need and how often you need them. This information is not intended to replace advice given to you by your health care provider. Make sure you discuss any questions you have with your health care provider. Document Released: 12/17/2015 Document Revised: 08/09/2016 Document Reviewed: 09/21/2015 Elsevier Interactive Patient Education  2017 [place] Prevention in the Home Falls can cause injuries. They can happen to people of all ages. There are many things you can do to make your home safe and to help prevent falls. What can I do on the outside of my home?  Regularly fix the edges of walkways and driveways and fix any cracks.  Remove anything that might make you trip as you walk through a door, such as a raised step or threshold.  Trim any bushes or trees on the path to your home.  Use bright outdoor lighting.  Clear any walking paths of anything that might make someone trip, such as rocks or tools.  Regularly check to see if handrails are loose or broken. Make sure that both sides of any steps have handrails.  Any raised decks and porches should have guardrails on the edges.  Have any leaves, snow, or ice cleared regularly.  Use sand or salt on walking paths during winter.  Clean up any spills in your garage right away. This includes oil or grease spills. What can I do in the bathroom?  Use night lights.  Install grab bars by the toilet and in the tub and shower. Do not use towel bars as grab bars.  Use non-skid mats or decals in the tub or shower.  If you need to sit down in the shower, use a plastic, non-slip stool.  Keep the floor dry. Clean up any water that  spills on the floor as soon as it happens.  Remove soap buildup in the tub or shower regularly.  Attach bath mats securely with double-sided non-slip rug tape.  Do not have throw rugs and other things on the floor that can make you trip. What can I do in the bedroom?  Use night lights.  Make sure that you have a light by your bed that is easy to reach.  Do not use any sheets or blankets that are too big for your bed. They should not hang down onto the floor.  Have a firm chair that has side arms. You can use this for support while you get dressed.  Do not have throw rugs and other things on the floor that can make you trip. What can I do in the kitchen?  Clean up any spills right away.  Avoid walking on wet floors.  Keep items that you use a lot in easy-to-reach places.  If you need to reach something above you, use a strong step stool that has a grab bar.  Keep electrical cords out of the way.  Do not use floor polish or wax that  makes floors slippery. If you must use wax, use non-skid floor wax.  Do not have throw rugs and other things on the floor that can make you trip. What can I do with my stairs?  Do not leave any items on the stairs.  Make sure that there are handrails on both sides of the stairs and use them. Fix handrails that are broken or loose. Make sure that handrails are as long as the stairways.  Check any carpeting to make sure that it is firmly attached to the stairs. Fix any carpet that is loose or worn.  Avoid having throw rugs at the top or bottom of the stairs. If you do have throw rugs, attach them to the floor with carpet tape.  Make sure that you have a light switch at the top of the stairs and the bottom of the stairs. If you do not have them, ask someone to add them for you. What else can I do to help prevent falls?  Wear shoes that:  Do not have high heels.  Have rubber bottoms.  Are comfortable and fit you well.  Are closed at the  toe. Do not wear sandals.  If you use a stepladder:  Make sure that it is fully opened. Do not climb a closed stepladder.  Make sure that both sides of the stepladder are locked into place.  Ask someone to hold it for you, if possible.  Clearly mark and make sure that you can see:  Any grab bars or handrails.  First and last steps.  Where the edge of each step is.  Use tools that help you move around (mobility aids) if they are needed. These include:  Canes.  Walkers.  Scooters.  Crutches.  Turn on the lights when you go into a dark area. Replace any light bulbs as soon as they burn out.  Set up your furniture so you have a clear path. Avoid moving your furniture around.  If any of your floors are uneven, fix them.  If there are any pets around you, be aware of where they are.  Review your medicines with your doctor. Some medicines can make you feel dizzy. This can increase your chance of falling. Ask your doctor what other things that you can do to help prevent falls. This information is not intended to replace advice given to you by your health care provider. Make sure you discuss any questions you have with your health care provider. Document Released: 09/16/2009 Document Revised: 04/27/2016 Document Reviewed: 12/25/2014 Elsevier Interactive Patient Education  2017 [employer].

## 2021-01-24 DIAGNOSIS — H5213 Myopia, bilateral: Secondary | ICD-10-CM | POA: Diagnosis not present

## 2021-02-21 ENCOUNTER — Telehealth: Payer: Self-pay

## 2021-02-21 NOTE — Telephone Encounter (Signed)
Spoke with patient and advised him his PCP schedule is booked but I could place him on PA or NP schedule for  Later this week, patient states that he prefers to be seen Wednesday. Only provider still with openings that day is Dr. Rosanna Randy . Tawanna Sat you do have openings but it is for hospital follow up at [facility] [place] would you be willing to see patient at one of these times? KW

## 2021-02-21 NOTE — Telephone Encounter (Signed)
Copied from [place] [phone]. Topic: Appointment Scheduling - Scheduling Inquiry for Clinic >> Feb 21, 2021  9:50 AM Lennox Solders wrote: Reason for CRM: Pt is having heel pain  for 2 or 3 weeks and would like to see a provider this wednesday

## 2021-02-21 NOTE — Telephone Encounter (Signed)
Yes you could use the 1120 or 3pm. The 1040 is a 40 min so the 1120 should be open

## 2021-02-23 ENCOUNTER — Encounter: Payer: Self-pay | Admitting: Physician Assistant

## 2021-02-23 ENCOUNTER — Other Ambulatory Visit: Payer: Self-pay

## 2021-02-23 ENCOUNTER — Ambulatory Visit (INDEPENDENT_AMBULATORY_CARE_PROVIDER_SITE_OTHER): Payer: Medicare HMO | Admitting: Physician Assistant

## 2021-02-23 VITALS — BP 111/86 | HR 69 | Temp 98.4°F | Wt 189.0 lb

## 2021-02-23 DIAGNOSIS — M7662 Achilles tendinitis, left leg: Secondary | ICD-10-CM

## 2021-02-23 MED ORDER — METHYLPREDNISOLONE 4 MG PO TBPK: ORAL_TABLET | ORAL | 0 refills | Status: DC

## 2021-02-23 NOTE — Patient Instructions (Signed)
[employer]'s Emergency Medicine: Concepts and Clinical Practice (9th ed., pp. 1392-1401). [place], [place]: [employer]. Retrieved from https://www.clinicalkey.[email or web address]!/content/[email or web address]  Achilles Tendinitis  Achilles tendinitis is inflammation of the tough, cord-like band that attaches the lower leg muscles to the heel bone (Achilles tendon). This is usually caused by overusing the tendon and the ankle joint. Achilles tendinitis usually gets better over time with treatment and caring for yourself at home. It can take weeks or months to heal completely. What are the causes? This condition may be caused by:  A sudden increase in exercise or activity, such as running.  Doing the same exercises or activities, such as jumping, over and over.  Not warming up calf muscles before exercising.  Exercising in shoes that are worn out or not made for exercise.  Having arthritis or a bone growth (spur) on the back of the heel bone. This can rub against the tendon and hurt it.  Age-related wear and tear. Tendons become less flexible with age and are more likely to be injured. What are the signs or symptoms? Common symptoms of this condition include:  Pain in the Achilles tendon or in the back of the leg, just above the heel. The pain usually gets worse with exercise.  Stiffness or soreness in the back of the leg, especially in the morning.  Swelling of the skin over the Achilles tendon.  Thickening of the tendon.  Trouble standing on tiptoe. How is this diagnosed? This condition is diagnosed based on your symptoms and a physical exam. You may have tests, including:  X-rays.  MRI. How is this treated? The goal of treatment is to relieve symptoms and help your injury heal. Treatment may include:  Decreasing or stopping activities that caused the tendinitis. This may mean switching to low-impact exercises like biking or swimming.  Icing the injured  area.  Doing physical therapy, including strengthening and stretching exercises.  Taking NSAIDs, such as ibuprofen, to help relieve pain and swelling.  Using supportive shoes, wraps, heel lifts, or a walking boot (air cast).  Having surgery. This may be done if your symptoms do not improve after other treatments.  Using high-energy shock wave impulses to stimulate the healing process (extracorporeal shock wave therapy). This is rare.  Having an injection of medicines that help relieve inflammation (corticosteroids). This is rare. Follow these instructions at home: If you have an air cast:  Wear the air cast as told by your health care provider. Remove it only as told by your health care provider.  Loosen it if your toes tingle, become numb, or turn cold and blue.  Keep it clean.  If the air cast is not waterproof: ? Do not let it get wet. ? Cover it with a watertight covering when you take a bath or shower. Managing pain, stiffness, and swelling  If directed, put ice on the injured area. To do this: ? If you have a removable air cast, remove it as told by your health care provider. ? Put ice in a plastic bag. ? Place a towel between your skin and the bag. ? Leave the ice on for 20 minutes, 2-3 times a day.  Move your toes often to reduce stiffness and swelling.  Raise (elevate) your foot above the level of your heart while you are sitting or lying down.   Activity  Gradually return to your normal activities as told by your health care provider. Ask your health care provider what activities are safe for you.  Do not   do activities that cause pain.  Consider doing low-impact exercises, like cycling or swimming.  Ask your health care provider when it is safe to drive if you have an air cast on your foot.  If physical therapy was prescribed, do exercises as told by your health care provider or physical therapist. General instructions  If directed, wrap your foot with an  elastic bandage or other wrap. This can help to keep your tendon from moving too much while it heals. Your health care provider will show you how to wrap your foot correctly.  Wear supportive shoes or heel lifts only as told by your health care provider.  Take over-the-counter and prescription medicines only as told by your health care provider.  Keep all follow-up visits as told by your health care provider. This is important. Contact a health care provider if you:  Have symptoms that get worse.  Have pain that does not get better with medicine.  Develop new, unexplained symptoms.  Develop warmth and swelling in your foot.  Have a fever. Get help right away if you:  Have a sudden popping sound or sensation in your Achilles tendon followed by severe pain.  Cannot move your toes or foot.  Cannot put any weight on your foot.  Your foot or toes become numb and look Mader or blue even after loosening your bandage or air cast. Summary  Achilles tendinitis is inflammation of the tough, cord-like band that attaches the lower leg muscles to the heel bone (Achilles tendon).  This condition is usually caused by overusing the tendon and the ankle joint. It can also be caused by arthritis or normal aging.  The most common symptoms of this condition include pain, swelling, or stiffness in the Achilles tendon or in the back of the leg.  This condition is usually treated by decreasing or stopping activities that caused the tendinitis, icing the injured area, taking NSAIDs, and doing physical therapy. This information is not intended to replace advice given to you by your health care provider. Make sure you discuss any questions you have with your health care provider. Document Revised: 04/07/2019 Document Reviewed: 04/07/2019 Elsevier Patient Education  [place].   Achilles Tendinitis Rehab Ask your health care provider which exercises are safe for you. Do exercises exactly as  told by your health care provider and adjust them as directed. It is normal to feel mild stretching, pulling, tightness, or discomfort as you do these exercises. Stop right away if you feel sudden pain or your pain gets worse. Do not begin these exercises until told by your health care provider. Stretching and range-of-motion exercises These exercises warm up your muscles and joints and improve the movement and flexibility of your ankle. These exercises also help to relieve pain. Standing wall calf stretch with straight knee 1. Stand with your hands against a wall. 2. Extend your left / right leg behind you, and bend your front knee slightly. Keep both of your heels on the floor. 3. Point the toes of your back foot slightly inward. 4. Keeping your heels on the floor and your back knee straight, shift your weight toward the wall. Do not allow your back to arch. You should feel a gentle stretch in your upper calf. 5. Hold this position for __________ seconds. Repeat __________ times. Complete this exercise __________ times a day.   Standing wall calf stretch with bent knee 1. Stand with your hands against a wall. 2. Extend your left / right leg  behind you, and bend your front knee slightly. Keep both of your heels on the floor. 3. Point the toes of your back foot slightly inward. 4. Keeping your heels on the floor, bend your back knee slightly. You should feel a gentle stretch deep in your lower calf near your heel. 5. Hold this position for __________ seconds. Repeat __________ times. Complete this exercise __________ times a day. Strengthening exercises These exercises build strength and control of your ankle. Endurance is the ability to use your muscles for a long time, even after they get tired. Plantar flexion with band In this exercise, you push your toes downward, away from you, with an exercise band providing resistance. 1. Sit on the floor with your left / right leg extended. You may put  a pillow under your calf to give your foot more room to move. 2. Loop a rubber exercise band or tube around the ball of your left / right foot. The ball of your foot is on the walking surface, right under your toes. The band or tube should be slightly tense when your foot is relaxed. If the band or tube slips, you can put on your shoe or put a washcloth between the band and your foot to help it stay in place. 3. Slowly point your toes downward, pushing them away from you (plantar flexion). 4. Hold this position for __________ seconds. 5. Slowly release the tension in the band or tube, controlling smoothly until your foot is back to the starting position. 6. Repeat steps 1-5 with your left / right leg. Repeat __________ times. Complete this exercise __________ times a day.   Eccentric heel drop In this exercise, you stand and slowly raise your heel and then slowly lower it. This exercise lengthens the calf muscles (eccentric) while the heel bears weight. If this exercise is too easy, try doing it while wearing a backpack with weights in it. 1. Stand on a step with the balls of your feet. The ball of your foot is on the walking surface, right under your toes. ? Do not put your heels on the step. ? For balance, rest your hands on the wall or on a railing. 2. Rise up onto the balls of your feet. 3. Keeping your heels up, shift all of your weight to your left / right leg and pick up your other leg. 4. Slowly lower your left / right leg so your heel drops below the level of the step. 5. Put down your other foot before returning to the start position. If told by your health care provider, build up to: ? 3 sets of 15 repetitions while keeping your knees straight. ? 3 sets of 15 repetitions while keeping your knees slightly bent as far as told by your health care provider. Repeat __________ times. Complete this exercise __________ times a day.   Balance exercises These exercises improve or maintain your  balance. Balance is important in preventing falls. Single leg stand If this exercise is too easy, you can try it with your eyes closed or while standing on a pillow. 1. Without shoes, stand near a railing or in a door frame. Hold on to the railing or door frame as needed. 2. Stand on your left / right foot. Keep your big toe down on the floor and try to keep your arch lifted. 3. Hold this position for __________ seconds. Repeat __________ times. Complete this exercise __________ times a day. This information is not intended to replace advice  given to you by your health care provider. Make sure you discuss any questions you have with your health care provider. Document Revised: 03/10/2019 Document Reviewed: 09/02/2018 Elsevier Patient Education  2021 [employer].

## 2021-02-23 NOTE — Progress Notes (Signed)
Established patient visit   Patient: Michael Hicks   DOB: 1946-09-23   75 y.o. Male  MRN: [id number] Visit Date: 02/23/2021  Today's healthcare provider: Mar Daring, PA-C   Chief Complaint  Patient presents with  . Foot Pain   Subjective    Foot Pain This is a new problem. The current episode started 1 to 4 weeks ago. The problem occurs daily. The problem has been waxing and waning. Associated symptoms include arthralgias. Pertinent negatives include no joint swelling, myalgias, neck pain or weakness. He has tried NSAIDs for the symptoms.    Patient has been increasing walking and hiking of recent in preparation for senior games.  Patient Active Problem List   Diagnosis Date Noted  . Polyp of transverse colon   . Shortness of breath 04/02/2020  . Gout involving toe of left foot 04/02/2020  . Personal history of colonic polyps   . Body mass index (BMI) of 25.0-25.9 in adult 06/10/2015  . ED (erectile dysfunction) of organic origin 06/10/2015   Past Medical History:  Diagnosis Date  . COVID-19 07/29/2020   HX  . Erectile dysfunction   . Gout   . Hx of colonic polyps   . Precancerous skin lesion   . SOB (shortness of breath)    Social History   Tobacco Use  . Smoking status: Former Smoker    Packs/day: 1.00    Years: 15.00    Pack years: 15.00    Types: Cigarettes    Quit date: 12/04/1990    Years since quitting: 30.2  . Smokeless tobacco: Never Used  Vaping Use  . Vaping Use: Never used  Substance Use Topics  . Alcohol use: Yes    Alcohol/week: 14.0 - 21.0 standard drinks    Types: 14 - 21 Cans of beer per week    Comment: 2-3 beers a night  . Drug use: No   No Known Allergies   Medications: Outpatient Medications Prior to Visit  Medication Sig  . budesonide-formoterol (SYMBICORT) 160-4.5 MCG/ACT inhaler Inhale 2 puffs into the lungs 2 (two) times daily.  . colchicine (COLCRYS) 0.6 MG tablet TAKE 2 TABLETS BY MOUTH TODAY, THEN 1 TABLET  DAILY (Patient taking differently: as needed. TAKE 2 TABLETS BY MOUTH TODAY, THEN 1 TABLET DAILY)  . Multiple Vitamins-Minerals (MULTIVITAMIN WITH MINERALS) tablet Take 1 tablet by mouth daily.  . naproxen sodium (ALEVE) 220 MG tablet Take 220 mg by mouth daily as needed.  . sildenafil (REVATIO) 20 MG tablet TAKE TWO AND A HALF (2.5) TABLETS BY MOUTH 30 MINUTES BEFORE SEXUAL INTERCOURSE   No facility-administered medications prior to visit.    Review of Systems  Constitutional: Negative.   Respiratory: Negative.   Cardiovascular: Negative.   Musculoskeletal: Positive for arthralgias and gait problem. Negative for back pain, joint swelling, myalgias, neck pain and neck stiffness.  Neurological: Negative for weakness.       Objective    BP 111/86 (BP Location: Right Arm, Patient Position: Sitting, Cuff Size: Large)   Pulse 69   Temp 98.4 F (36.9 C) (Oral)   Wt 189 lb (85.7 kg)   SpO2 100%   BMI 25.63 kg/m    Physical Exam Vitals reviewed.  Constitutional:      General: He is not in acute distress.    Appearance: Normal appearance. He is well-developed. He is not ill-appearing.  HENT:     Head: Normocephalic and atraumatic.  Eyes:     Conjunctiva/sclera: Conjunctivae normal.  Pulmonary:     Effort: Pulmonary effort is normal. No respiratory distress.  Musculoskeletal:     Cervical back: Normal range of motion and neck supple.     Right lower leg: No edema.     Left lower leg: No edema.     Right ankle: Normal.     Left ankle: Swelling present. Tenderness present. Anterior drawer test negative. Normal pulse.     Left Achilles Tendon: Tenderness present. No defects. Thompson's test negative.  Neurological:     Mental Status: He is alert.  Psychiatric:        Behavior: Behavior normal.        Thought Content: Thought content normal.        Judgment: Judgment normal.      No results found for any visits on 02/23/21.  Assessment & Plan     1. Achilles tendinitis,  left leg Worsening. Medrol given as below. Ice after activity. Exercises and stretches printed on AVS. Yoga could benefit patient as well. Call if worsening.  - methylPREDNISolone (MEDROL) 4 MG TBPK tablet; 6 day taper; take as directed on package instructions  Dispense: 21 tablet; Refill: 0  2. Achilles bursitis of left lower extremity See above medical treatment plan. - methylPREDNISolone (MEDROL) 4 MG TBPK tablet; 6 day taper; take as directed on package instructions  Dispense: 21 tablet; Refill: 0   No follow-ups on file.      Reynolds Bowl, PA-C, have reviewed all documentation for this visit. The documentation on 02/23/21 for the exam, diagnosis, procedures, and orders are all accurate and complete.   Rubye Beach  [facility] [phone] (phone) [phone] (fax)  [place]

## 2021-04-20 ENCOUNTER — Ambulatory Visit (INDEPENDENT_AMBULATORY_CARE_PROVIDER_SITE_OTHER): Payer: Medicare HMO | Admitting: Family Medicine

## 2021-04-20 ENCOUNTER — Other Ambulatory Visit: Payer: Self-pay

## 2021-04-20 ENCOUNTER — Encounter: Payer: Self-pay | Admitting: Family Medicine

## 2021-04-20 VITALS — BP 131/84 | HR 55 | Temp 97.2°F | Resp 16 | Ht 72.0 in | Wt 182.8 lb

## 2021-04-20 DIAGNOSIS — Z0000 Encounter for general adult medical examination without abnormal findings: Secondary | ICD-10-CM | POA: Diagnosis not present

## 2021-04-20 DIAGNOSIS — Z13228 Encounter for screening for other metabolic disorders: Secondary | ICD-10-CM | POA: Diagnosis not present

## 2021-04-20 DIAGNOSIS — N529 Male erectile dysfunction, unspecified: Secondary | ICD-10-CM

## 2021-04-20 DIAGNOSIS — Z125 Encounter for screening for malignant neoplasm of prostate: Secondary | ICD-10-CM | POA: Diagnosis not present

## 2021-04-20 NOTE — Progress Notes (Signed)
Complete physical exam   Patient: Michael Hicks   DOB: 09-09-1946   75 y.o. Male  MRN: [id number] Visit Date: 04/20/2021  Today's healthcare provider: Lelon Huh, MD   Chief Complaint  Patient presents with  . Annual Exam  . Hyperlipidemia  . Gout   Subjective    Michael Hicks is a 75 y.o. male who presents today for a complete physical exam.  He reports consuming a general diet. Home exercise routine includes walking. He generally feels fairly well. He reports sleeping fairly well. He does have additional problems to discuss today.   Had AWV with [facility] on 01/04/2021.   09/10/2020: Colonscopy  HPI   Weakness: Patient complains of left knee weakness. He states his left knee occasionally gives out on him when he hikes  Follow up for Gout:  The patient was last seen for this 1 year ago. Changes made at last visit include none.  He reports good compliance with treatment. He feels that condition is Unchanged. He is not having side effects.   -----------------------------------------------------------------------------------------    Past Medical History:  Diagnosis Date  . COVID-19 07/29/2020   HX  . Erectile dysfunction   . Gout   . Hx of colonic polyps   . Precancerous skin lesion   . SOB (shortness of breath)    Past Surgical History:  Procedure Laterality Date  . COLONOSCOPY    . COLONOSCOPY WITH PROPOFOL N/A 01/14/2016   Procedure: COLONOSCOPY WITH PROPOFOL;  Surgeon: Lucilla Lame, MD;  Location: [facility];  Service: Endoscopy;  Laterality: N/A;  . COLONOSCOPY WITH PROPOFOL N/A 07/26/2017   Procedure: COLONOSCOPY WITH PROPOFOL;  Surgeon: Lucilla Lame, MD;  Location: [place];  Service: Gastroenterology;  Laterality: N/A;  . COLONOSCOPY WITH PROPOFOL N/A 09/10/2020   Procedure: COLONOSCOPY WITH PROPOFOL;  Surgeon: Lucilla Lame, MD;  Location: [facility];  Service: Endoscopy;  Laterality: N/A;  priority 4 COVID (+)  07/28/20  . POLYPECTOMY  01/14/2016   Procedure: POLYPECTOMY;  Surgeon: Lucilla Lame, MD;  Location: [place];  Service: Endoscopy;;  . POLYPECTOMY  09/10/2020   Procedure: POLYPECTOMY;  Surgeon: Lucilla Lame, MD;  Location: [place];  Service: Endoscopy;;   Social History   Socioeconomic History  . Marital status: Divorced    Spouse name: Not on file  . Number of children: 1  . Years of education: 58  . Highest education level: Associate degree: occupational, Hotel manager, or vocational program  Occupational History  . Occupation: Finance    Comment: RETIRED  Tobacco Use  . Smoking status: Former Smoker    Packs/day: 1.00    Years: 15.00    Pack years: 15.00    Types: Cigarettes    Quit date: 12/04/1990    Years since quitting: 30.3  . Smokeless tobacco: Never Used  Vaping Use  . Vaping Use: Never used  Substance and Sexual Activity  . Alcohol use: Yes    Alcohol/week: 14.0 - 21.0 standard drinks    Types: 14 - 21 Cans of beer per week    Comment: 2-3 beers a night  . Drug use: No  . Sexual activity: Yes  Other Topics Concern  . Not on file  Social History Narrative   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    Social Determinants of Health   Financial Resource Strain: Low Risk   . Difficulty of Paying Living Expenses: Not hard at all  Food Insecurity: No Food Insecurity  .  Worried About Charity fundraiser in the Last Year: Never true  . Ran Out of Food in the Last Year: Never true  Transportation Needs: No Transportation Needs  . Lack of Transportation (Medical): No  . Lack of Transportation (Non-Medical): No  Physical Activity: Sufficiently Active  . Days of Exercise per Week: 4 days  . Minutes of Exercise per Session: 40 min  Stress: No Stress Concern Present  . Feeling of Stress : Not at all  Social Connections: Moderately Integrated  . Frequency of Communication with Friends and Family: More than three times a week  . Frequency of Social  Gatherings with Friends and Family: More than three times a week  . Attends Religious Services: More than 4 times per year  . Active Member of Clubs or Organizations: Yes  . Attends Archivist Meetings: More than 4 times per year  . Marital Status: Divorced  Human resources officer Violence: Not At Risk  . Fear of Current or Ex-Partner: No  . Emotionally Abused: No  . Physically Abused: No  . Sexually Abused: No   Family Status  Relation Name Status  . Mother  Deceased  . Father  Deceased       cerebrovascular accident  . Brother  Deceased  . Brother  Alive  . Brother  Alive   Family History  Problem Relation Age of Onset  . COPD Mother   . Heart disease Mother   . Hypertension Father   . Cancer Brother        bone cancer   No Known Allergies  Patient Care Team: Birdie Sons, MD as PCP - General (Family Medicine) Idelle Leech, OD as Consulting Physician (Optometry) Brendolyn Patty, MD (Dermatology) Lucilla Lame, MD as Consulting Physician (Gastroenterology)   Medications: Outpatient Medications Prior to Visit  Medication Sig  . colchicine (COLCRYS) 0.6 MG tablet TAKE 2 TABLETS BY MOUTH TODAY, THEN 1 TABLET DAILY (Patient taking differently: as needed. TAKE 2 TABLETS BY MOUTH TODAY, THEN 1 TABLET DAILY)  . Multiple Vitamins-Minerals (MULTIVITAMIN WITH MINERALS) tablet Take 1 tablet by mouth daily.  . naproxen sodium (ALEVE) 220 MG tablet Take 220 mg by mouth daily as needed.  . sildenafil (REVATIO) 20 MG tablet TAKE TWO AND A HALF (2.5) TABLETS BY MOUTH 30 MINUTES BEFORE SEXUAL INTERCOURSE  . [DISCONTINUED] budesonide-formoterol (SYMBICORT) 160-4.5 MCG/ACT inhaler Inhale 2 puffs into the lungs 2 (two) times daily.  . [DISCONTINUED] methylPREDNISolone (MEDROL) 4 MG TBPK tablet 6 day taper; take as directed on package instructions   No facility-administered medications prior to visit.    Review of Systems  Constitutional: Negative for appetite change, chills,  fatigue and fever.  HENT: Negative for congestion, ear pain, hearing loss, nosebleeds and trouble swallowing.   Eyes: Negative for pain and visual disturbance.  Respiratory: Negative for cough, chest tightness and shortness of breath.   Cardiovascular: Negative for chest pain, palpitations and leg swelling.  Gastrointestinal: Negative for abdominal pain, blood in stool, constipation, diarrhea, nausea and vomiting.  Endocrine: Negative for polydipsia, polyphagia and polyuria.  Genitourinary: Negative for dysuria and flank pain.  Musculoskeletal: Negative for arthralgias, back pain, joint swelling, myalgias and neck stiffness.  Skin: Negative for color change, rash and wound.  Neurological: Positive for weakness (left knee). Negative for dizziness, tremors, seizures, speech difficulty, light-headedness and headaches.  Psychiatric/Behavioral: Negative for behavioral problems, confusion, decreased concentration, dysphoric mood and sleep disturbance. The patient is not nervous/anxious.   All other systems reviewed and are  negative.     Objective    BP 131/84 (BP Location: Left Arm, Patient Position: Sitting, Cuff Size: Normal)   Pulse (!) 55   Temp (!) 97.2 F (36.2 C) (Temporal)   Resp 16   Ht 6' (1.829 m)   Wt 182 lb 12.8 oz (82.9 kg)   BMI 24.79 kg/m    Physical Exam    General Appearance:    Well developed, well nourished male. Alert, cooperative, in no acute distress, appears stated age  Head:    Normocephalic, without obvious abnormality, atraumatic  Eyes:    PERRL, conjunctiva/corneas clear, EOM's intact, fundi    benign, both eyes       Ears:    Normal TM's and external ear canals, both ears  Neck:   Supple, symmetrical, trachea midline, no adenopathy;       thyroid:  No enlargement/tenderness/nodules; no carotid   bruit or JVD  Back:     Symmetric, no curvature, ROM normal, no CVA tenderness  Lungs:     Clear to auscultation bilaterally, respirations unlabored  Chest  wall:    No tenderness or deformity  Heart:    Bradycardic. Normal rhythm. No murmurs, rubs, or gallops.  S1 and S2 normal  Abdomen:     Soft, non-tender, bowel sounds active all four quadrants,    no masses, no organomegaly  Genitalia:    deferred  Rectal:    deferred  Extremities:   All extremities are intact. No cyanosis or edema  Pulses:   2+ and symmetric all extremities  Skin:   Skin color, texture, turgor normal, no rashes or lesions  Lymph nodes:   Cervical, supraclavicular, and axillary nodes normal  Neurologic:   CNII-XII intact. Normal strength, sensation and reflexes      throughout    Last depression screening scores PHQ 2/9 Scores 04/20/2021 01/04/2021 12/31/2019  PHQ - 2 Score 0 0 0  PHQ- 9 Score 0 - -   Last fall risk screening Fall Risk  04/20/2021  Falls in the past year? 0  Number falls in past yr: 0  Injury with Fall? 0  Follow up Falls evaluation completed   Last Audit-C alcohol use screening Alcohol Use Disorder Test (AUDIT) 04/20/2021  1. How often do you have a drink containing alcohol? 4  2. How many drinks containing alcohol do you have on a typical day when you are drinking? 0  3. How often do you have six or more drinks on one occasion? 0  AUDIT-C Score 4  4. How often during the last year have you found that you were not able to stop drinking once you had started? 0  5. How often during the last year have you failed to do what was normally expected from you because of drinking? 0  6. How often during the last year have you needed a first drink in the morning to get yourself going after a heavy drinking session? 0  7. How often during the last year have you had a feeling of guilt of remorse after drinking? 0  8. How often during the last year have you been unable to remember what happened the night before because you had been drinking? 0  9. Have you or someone else been injured as a result of your drinking? 0  10. Has a relative or friend or a doctor or  another health worker been concerned about your drinking or suggested you cut down? 0  Alcohol Use Disorder Identification  Test Final Score (AUDIT) 4  Alcohol Brief Interventions/Follow-up -   A score of 3 or more in women, and 4 or more in men indicates increased risk for alcohol abuse, EXCEPT if all of the points are from question 1   No results found for any visits on 04/20/21.  Assessment & Plan    Routine Health Maintenance and Physical Exam  Exercise Activities and Dietary recommendations Goals    . DIET - INCREASE WATER INTAKE     Recommend to drink at least 6-8 8oz glasses of water per day.       Immunization History  Administered Date(s) Administered  . Influenza, High Dose Seasonal PF 10/20/2016, 09/28/2017, 09/03/2018, 09/11/2020  . Influenza,inj,Quad PF,6+ Mos 12/01/2015  . Influenza-Unspecified 09/28/2017, 09/05/2019  . PFIZER(Purple Top)SARS-COV-2 Vaccination 01/27/2020, 02/17/2020, 09/13/2020  . Pneumococcal Conjugate-13 11/24/2013  . Pneumococcal Polysaccharide-23 11/25/2014  . Td 11/22/2012  . Tdap 11/22/2012  . Zoster 11/22/2012  . Zoster Recombinat (Shingrix) 09/05/2019    Health Maintenance  Topic Date Due  . COVID-19 Vaccine (4 - Booster for [place] series) 12/14/2020  . INFLUENZA VACCINE  07/04/2021  . TETANUS/TDAP  11/22/2022  . COLONOSCOPY (Pts 45-57yrs Insurance coverage will need to be confirmed)  09/10/2025  . Hepatitis C Screening  Completed  . PNA vac Low Risk Adult  Completed  . HPV VACCINES  Aged Out    Discussed health benefits of physical activity, and encouraged him to engage in regular exercise appropriate for his age and condition.  1. Annual physical exam   2. Prostate cancer screening  - PSA Total (Reflex To Free) ([facility] only)  3. Screening for metabolic disorder  - Basic metabolic panel  4. ED (erectile dysfunction) of organic origin He states that sildenafil cost has skyrocketed at [facility]. Recommend he check with other  pharmacies on cost of 20-100mg  tablets and to call back if he finds one it is more cost effective.         The entirety of the information documented in the History of Present Illness, Review of Systems and Physical Exam were personally obtained by me. Portions of this information were initially documented by the CMA and reviewed by me for thoroughness and accuracy.      Lelon Huh, MD  [facility] [phone] (phone) [phone] (fax)  [place]

## 2021-04-20 NOTE — Patient Instructions (Addendum)
There are now generics available for 20mg  , 50mg , and 100mg  tablets of sildenafil. Check prices at [facility], [facility], and [facility] and let me know if you want me to send a prescription to one of them.

## 2021-04-21 LAB — BASIC METABOLIC PANEL
BUN/Creatinine Ratio: 20 (ref 10–24)
BUN: 18 mg/dL (ref 8–27)
CO2: 25 mmol/L (ref 20–29)
Calcium: 9.1 mg/dL (ref 8.6–10.2)
Chloride: 100 mmol/L (ref 96–106)
Creatinine, Ser: 0.9 mg/dL (ref 0.76–1.27)
Glucose: 92 mg/dL (ref 65–99)
Potassium: 4.3 mmol/L (ref 3.5–5.2)
Sodium: 141 mmol/L (ref 134–144)
eGFR: 91 mL/min/{1.73_m2} (ref >59)

## 2021-04-21 LAB — PSA TOTAL (REFLEX TO FREE): Prostate Specific Ag, Serum: 1.5 ng/mL (ref 0.0–4.0)

## 2021-04-22 ENCOUNTER — Other Ambulatory Visit: Payer: Self-pay | Admitting: Family Medicine

## 2021-04-22 DIAGNOSIS — N529 Male erectile dysfunction, unspecified: Secondary | ICD-10-CM

## 2021-04-22 MED ORDER — SILDENAFIL CITRATE 20 MG PO TABS: ORAL_TABLET | ORAL | 5 refills | Status: DC

## 2021-04-22 NOTE — Telephone Encounter (Signed)
Pt had cpe with dr Caryn Section on 04-20-2021 and was told to callback. Pt has [facility] [address] in [place] phone number [phone] and would like sildenafil 20 mg sent to [facility]

## 2021-04-22 NOTE — Telephone Encounter (Signed)
Please advise. Thanks.  

## 2021-05-18 ENCOUNTER — Encounter: Payer: Self-pay | Admitting: Internal Medicine

## 2021-05-18 ENCOUNTER — Other Ambulatory Visit: Payer: Self-pay

## 2021-05-18 ENCOUNTER — Ambulatory Visit: Payer: Self-pay

## 2021-05-18 ENCOUNTER — Ambulatory Visit: Payer: Medicare HMO | Admitting: Internal Medicine

## 2021-05-18 ENCOUNTER — Ambulatory Visit
Admission: RE | Admit: 2021-05-18 | Discharge: 2021-05-18 | Disposition: A | Discharge status: Home / Self Care | Payer: Medicare HMO | Source: Ambulatory Visit | Attending: Internal Medicine | Admitting: Internal Medicine

## 2021-05-18 ENCOUNTER — Ambulatory Visit
Admission: RE | Admit: 2021-05-18 | Discharge: 2021-05-18 | Disposition: A | Discharge status: Home / Self Care | Payer: Medicare HMO | Attending: Internal Medicine | Admitting: Internal Medicine

## 2021-05-18 VITALS — BP 111/73 | HR 76 | Temp 98.7°F | Ht 71.26 in | Wt 184.6 lb

## 2021-05-18 DIAGNOSIS — B351 Tinea unguium: Secondary | ICD-10-CM

## 2021-05-18 DIAGNOSIS — M19072 Primary osteoarthritis, left ankle and foot: Secondary | ICD-10-CM | POA: Diagnosis not present

## 2021-05-18 DIAGNOSIS — M7989 Other specified soft tissue disorders: Secondary | ICD-10-CM | POA: Diagnosis not present

## 2021-05-18 DIAGNOSIS — R2242 Localized swelling, mass and lump, left lower limb: Secondary | ICD-10-CM

## 2021-05-18 NOTE — Progress Notes (Signed)
BP 111/73   Pulse 76   Temp 98.7 F (37.1 C) (Oral)   Ht 5' 11.26" (1.81 m)   Wt 184 lb 9.6 oz (83.7 kg)   SpO2 98%   BMI 25.56 kg/m    Subjective:    Patient ID: Michael Hicks, male    DOB: 1946/11/09, 75 y.o.   MRN: [id number]  Chief Complaint  Patient presents with   L. ankle pain    Started on Monday morning    HPI: TAYVIN PRESLAR is a 75 y.o. male  Ankle Pain  The incident occurred more than 1 week ago (statred monday morning when he got up, no falls no trauma to the joint.). The incident occurred at home. There was no injury mechanism. The pain is present in the left foot. The quality of the pain is described as stabbing (worse when he stands on the foot.). The pain is at a severity of 7/10. The pain is mild. Pertinent negatives include no inability to bear weight, loss of motion, loss of sensation, muscle weakness, numbness or tingling. He has tried NSAIDs (aleve helped some) for the symptoms.   Chief Complaint  Patient presents with   L. ankle pain    Started on Monday morning    Relevant past medical, surgical, family and social history reviewed and updated as indicated. Interim medical history since our last visit reviewed. Allergies and medications reviewed and updated.  Review of Systems  Neurological:  Negative for tingling and numbness.   Per HPI unless specifically indicated above     Objective:    BP 111/73   Pulse 76   Temp 98.7 F (37.1 C) (Oral)   Ht 5' 11.26" (1.81 m)   Wt 184 lb 9.6 oz (83.7 kg)   SpO2 98%   BMI 25.56 kg/m   Wt Readings from Last 3 Encounters:  05/18/21 184 lb 9.6 oz (83.7 kg)  04/20/21 182 lb 12.8 oz (82.9 kg)  02/23/21 189 lb (85.7 kg)    Physical Exam  Results for orders placed or performed in visit on 04/20/21  PSA Total (Reflex To Free) ([facility] only)  Result Value Ref Range   Prostate Specific Ag, Serum 1.5 0.0 - 4.0 ng/mL   Reflex Criteria Comment   Basic metabolic panel  Result Value Ref Range    Glucose 92 65 - 99 mg/dL   BUN 18 8 - 27 mg/dL   Creatinine, Ser 0.90 0.76 - 1.27 mg/dL   eGFR 91 >59 mL/min/1.73   BUN/Creatinine Ratio 20 10 - 24   Sodium 141 134 - 144 mmol/L   Potassium 4.3 3.5 - 5.2 mmol/L   Chloride 100 96 - 106 mmol/L   CO2 25 20 - 29 mmol/L   Calcium 9.1 8.6 - 10.2 mg/dL        Current Outpatient Medications:    colchicine (COLCRYS) 0.6 MG tablet, TAKE 2 TABLETS BY MOUTH TODAY, THEN 1 TABLET DAILY (Patient taking differently: as needed. TAKE 2 TABLETS BY MOUTH TODAY, THEN 1 TABLET DAILY), Disp: 30 tablet, Rfl: 5   Multiple Vitamins-Minerals (MULTIVITAMIN WITH MINERALS) tablet, Take 1 tablet by mouth daily., Disp: , Rfl:    naproxen sodium (ALEVE) 220 MG tablet, Take 220 mg by mouth daily as needed., Disp: , Rfl:    sildenafil (REVATIO) 20 MG tablet, TAKE TWO AND A HALF (2.5) TABLETS BY MOUTH 30 MINUTES BEFORE SEXUAL INTERCOURSE, Disp: 50 tablet, Rfl: 5    Assessment & Plan:  Ankle pain ?  Sec to gout  Wil check uric acid  levels Check xrays  not acutely inflammed  -  is on allopurinol will check URIC acid levels today  2. Onychomycosis  Will refr to podiatrist  Consider lamisil    Problem List Items Addressed This Visit   None Visit Diagnoses     Localized swelling of left foot    -  Primary   Relevant Orders   DG Foot Complete Left (Completed)   Uric acid (Completed)   CBC (STAT) (Completed)   Onychomycosis       Relevant Orders   Ambulatory referral to Podiatry        Orders Placed This Encounter  Procedures   DG Foot Complete Left   Uric acid   CBC (STAT)   Ambulatory referral to Podiatry     No orders of the defined types were placed in this encounter.    Follow up plan: No follow-ups on file.

## 2021-05-18 NOTE — Telephone Encounter (Signed)
FYI bfp scheduled today

## 2021-05-18 NOTE — Telephone Encounter (Signed)
Patient called, left VM to return the call to the office to discuss ankle pain.      Summary: left ankle   Patient has felt left ankle discomfort since Monday 05/16/21   The patient doesn't recall twisting or spraining their ankle but would like to discuss further when possible

## 2021-05-18 NOTE — Telephone Encounter (Addendum)
Patient called and says he woke up Monday morning with pain on the top of the left foot near the bend. He says he went to work. Tuesday he says it was a little worse, so he took Aleve and it helped the pain. He says he has some redness and swelling to the area. He says the pain is a 3-4 only when he's walking or bending the foot, any movement of the foot. No other symptoms. Advised no openings with PCP or any provider. Spoke to [place], [facility] who offered the UC or the patient an appointment at [facility]. Patient agrees to OV at [facility]. Elena scheduled for today at 1320 with Dr. Neomia Dear. Patient advised of the appointment, care advice given, he verbalized understanding.   Reason for Disposition  [1] MILD pain (e.g., does not interfere with normal activities) AND [2] present > 7 days  Answer Assessment - Initial Assessment Questions 1. ONSET: "When did the pain start?"      Monday morning I felt it 2. LOCATION: "Where is the pain located?"      Top left of the foot 3. PAIN: "How bad is the pain?"    (Scale 1-10; or mild, moderate, severe)  - MILD (1-3): doesn't interfere with normal activities.   - MODERATE (4-7): interferes with normal activities (e.g., work or school) or awakens from sleep, limping.   - SEVERE (8-10): excruciating pain, unable to do any normal activities, unable to walk.      Pain with walking 3-4 or moving the foot down 4. WORK OR EXERCISE: "Has there been any recent work or exercise that involved this part of the body?"      No 5. CAUSE: "What do you think is causing the ankle pain?"     No 6. OTHER SYMPTOMS: "Do you have any other symptoms?" (e.g., calf pain, rash, fever, swelling)     Redness, swelling on top of left foot 7. PREGNANCY: "Is there any chance you are pregnant?" "When was your last menstrual period?"     No  Protocols used: Ankle Pain-A-AH

## 2021-05-18 NOTE — Progress Notes (Signed)
Please let pt has  no acute fracture or dislocation. There is moderate arthritic changes of the first MTP joint with spurring. Continue aleve.

## 2021-05-19 LAB — CBC
Hematocrit: 41.4 % (ref 37.5–51.0)
Hemoglobin: 14.2 g/dL (ref 13.0–17.7)
MCH: 33.6 pg — ABNORMAL HIGH (ref 26.6–33.0)
MCHC: 34.3 g/dL (ref 31.5–35.7)
MCV: 98 fL — ABNORMAL HIGH (ref 79–97)
Platelets: 164 10*3/uL (ref 150–450)
RBC: 4.22 x10E6/uL (ref 4.14–5.80)
RDW: 11.5 % — ABNORMAL LOW (ref 11.6–15.4)
WBC: 10.3 10*3/uL (ref 3.4–10.8)

## 2021-05-19 LAB — URIC ACID: Uric Acid: 7.6 mg/dL (ref 3.8–8.4)

## 2021-05-20 ENCOUNTER — Telehealth: Payer: Self-pay

## 2021-05-20 NOTE — Telephone Encounter (Signed)
Copied from [place] [id number]. Topic: General - Other >> May 20, 2021  9:50 AM Leward Quan A wrote: Reason for CRM: Patient would like a call back with his lab results Ph# [phone]

## 2021-05-24 ENCOUNTER — Ambulatory Visit (INDEPENDENT_AMBULATORY_CARE_PROVIDER_SITE_OTHER): Payer: Medicare HMO | Admitting: Internal Medicine

## 2021-05-24 ENCOUNTER — Ambulatory Visit: Payer: Medicare HMO | Admitting: Internal Medicine

## 2021-05-24 ENCOUNTER — Other Ambulatory Visit: Payer: Self-pay

## 2021-05-24 ENCOUNTER — Encounter: Payer: Self-pay | Admitting: Internal Medicine

## 2021-05-24 VITALS — BP 120/70 | HR 56 | Temp 98.0°F | Ht 71.26 in | Wt 187.4 lb

## 2021-05-24 DIAGNOSIS — M109 Gout, unspecified: Secondary | ICD-10-CM

## 2021-05-24 MED ORDER — MITIGARE 0.6 MG PO CAPS
0.6000 | ORAL_CAPSULE | Freq: Every morning | ORAL | 2 refills | Status: DC
Start: 2021-05-24 — End: 2021-08-26

## 2021-05-24 NOTE — Progress Notes (Signed)
BP 120/70   Pulse (!) 56   Temp 98 F (36.7 C) (Oral)   Ht 5' 11.26" (1.81 m)   Wt 187 lb 6.4 oz (85 kg)   SpO2 98%   BMI 25.95 kg/m    Subjective:    Patient ID: Michael Hicks, male    DOB: 06-16-1946, 75 y.o.   MRN: [id number]  Chief Complaint  Patient presents with   Gout    Left ankle pain, much better now     HPI: GERHARD Hicks is a 75 y.o. male  Arthritis Presents for follow-up visit. He complains of pain. He reports no stiffness, joint swelling or joint warmth. Affected locations include the left ankle. Pertinent negatives include no diarrhea, dry eyes, dry mouth, dysuria, fatigue, fever, pain at night, pain while resting, rash, Raynaud's syndrome, uveitis or weight loss.   Chief Complaint  Patient presents with   Gout    Left ankle pain, much better now     Relevant past medical, surgical, family and social history reviewed and updated as indicated. Interim medical history since our last visit reviewed. Allergies and medications reviewed and updated.  Review of Systems  Constitutional:  Negative for fatigue, fever and weight loss.  Gastrointestinal:  Negative for diarrhea.  Genitourinary:  Negative for dysuria.  Musculoskeletal:  Positive for arthritis. Negative for joint swelling, myalgias and stiffness.  Skin:  Negative for rash.   Per HPI unless specifically indicated above     Objective:    BP 120/70   Pulse (!) 56   Temp 98 F (36.7 C) (Oral)   Ht 5' 11.26" (1.81 m)   Wt 187 lb 6.4 oz (85 kg)   SpO2 98%   BMI 25.95 kg/m   Wt Readings from Last 3 Encounters:  05/24/21 187 lb 6.4 oz (85 kg)  05/18/21 184 lb 9.6 oz (83.7 kg)  04/20/21 182 lb 12.8 oz (82.9 kg)    Physical Exam Vitals and nursing note reviewed.  Musculoskeletal:        General: Swelling present. No tenderness.     Left lower leg: No edema.  Skin:    Coloration: Skin is not pale.     Findings: Erythema and rash present. No lesion.   Results for orders placed or  performed in visit on 05/18/21  Uric acid  Result Value Ref Range   Uric Acid 7.6 3.8 - 8.4 mg/dL  CBC (STAT)  Result Value Ref Range   WBC 10.3 3.4 - 10.8 x10E3/uL   RBC 4.22 4.14 - 5.80 x10E6/uL   Hemoglobin 14.2 13.0 - 17.7 g/dL   Hematocrit 41.4 37.5 - 51.0 %   MCV 98 (H) 79 - 97 fL   MCH 33.6 (H) 26.6 - 33.0 pg   MCHC 34.3 31.5 - 35.7 g/dL   RDW 11.5 (L) 11.6 - 15.4 %   Platelets 164 150 - 450 x10E3/uL        Current Outpatient Medications:    colchicine (COLCRYS) 0.6 MG tablet, TAKE 2 TABLETS BY MOUTH TODAY, THEN 1 TABLET DAILY (Patient taking differently: as needed. TAKE 2 TABLETS BY MOUTH TODAY, THEN 1 TABLET DAILY), Disp: 30 tablet, Rfl: 5   Multiple Vitamins-Minerals (MULTIVITAMIN WITH MINERALS) tablet, Take 1 tablet by mouth daily., Disp: , Rfl:    naproxen sodium (ALEVE) 220 MG tablet, Take 220 mg by mouth daily as needed., Disp: , Rfl:    sildenafil (REVATIO) 20 MG tablet, TAKE TWO AND A HALF (2.5) TABLETS BY MOUTH  [place] INTERCOURSE, Disp: 50 tablet, Rfl: 5   MITIGARE 0.6 MG CAPS, Take 0.6 capsules by mouth in the morning., Disp: 30 capsule, Rfl: 2    Assessment & Plan:  Gout : improving, stable. continue mitigare. Will need to fu with his PCP. Might need allopurinol for  prevention of such recurrent episodes.    No orders of the defined types were placed in this encounter.    Meds ordered this encounter  Medications   MITIGARE 0.6 MG CAPS    Sig: Take 0.6 capsules by mouth in the morning.    Dispense:  30 capsule    Refill:  2     Follow up plan: No follow-ups on file.

## 2021-05-26 ENCOUNTER — Ambulatory Visit: Payer: Medicare HMO | Admitting: Podiatry

## 2021-05-26 ENCOUNTER — Encounter: Payer: Self-pay | Admitting: Podiatry

## 2021-05-26 ENCOUNTER — Other Ambulatory Visit: Payer: Self-pay

## 2021-05-26 DIAGNOSIS — M79675 Pain in left toe(s): Secondary | ICD-10-CM

## 2021-05-26 DIAGNOSIS — M79674 Pain in right toe(s): Secondary | ICD-10-CM | POA: Diagnosis not present

## 2021-05-26 DIAGNOSIS — B351 Tinea unguium: Secondary | ICD-10-CM | POA: Diagnosis not present

## 2021-05-26 NOTE — Progress Notes (Signed)
  Subjective:  Patient ID: Michael Hicks, male    DOB: 27-May-1946,  MRN: [id number]  Chief Complaint  Patient presents with   Nail Problem    Thick discolored nails    75 y.o. male returns for the above complaint.  Patient presents with thickened elongated dystrophic toenails x10.  Painful to touch.  Patient would like for me to debride down.  He is not diabetic.  He denies any other acute complaints.  Objective:  There were no vitals filed for this visit. Podiatric Exam: Vascular: dorsalis pedis and posterior tibial pulses are palpable bilateral. Capillary return is immediate. Temperature gradient is WNL. Skin turgor WNL  Sensorium: Normal Semmes Weinstein monofilament test. Normal tactile sensation bilaterally. Nail Exam: Pt has thick disfigured discolored nails with subungual debris noted bilateral entire nail hallux through fifth toenails.  Pain on palpation to the nails. Ulcer Exam: There is no evidence of ulcer or pre-ulcerative changes or infection. Orthopedic Exam: Muscle tone and strength are WNL. No limitations in general ROM. No crepitus or effusions noted. HAV  B/L.  Hammer toes 2-5  B/L. Skin: No Porokeratosis. No infection or ulcers    Assessment & Plan:   1. Pain due to onychomycosis of toenails of both feet     Patient was evaluated and treated and all questions answered.  Onychomycosis with pain  -Nails palliatively debrided as below. -Educated on self-care  Procedure: Nail Debridement Rationale: pain  Type of Debridement: manual, sharp debridement. Instrumentation: Nail nipper, rotary burr. Number of Nails: 10  Procedures and Treatment: Consent by patient was obtained for treatment procedures. The patient understood the discussion of treatment and procedures well. All questions were answered thoroughly reviewed. Debridement of mycotic and hypertrophic toenails, 1 through 5 bilateral and clearing of subungual debris. No ulceration, no infection noted.   Return Visit-Office Procedure: Patient instructed to return to the office for a follow up visit 3 months for continued evaluation and treatment.  Boneta Lucks, DPM    No follow-ups on file.

## 2021-08-26 ENCOUNTER — Other Ambulatory Visit: Payer: Self-pay | Admitting: Internal Medicine

## 2021-08-26 NOTE — Telephone Encounter (Signed)
Requested medication (s) are due for refill today - yes  Requested medication (s) are on the active medication list -yes  Future visit scheduled -no  Last refill: 05/05/21 #3- 2RF  Notes to clinic: Request RF: outside provider  Requested Prescriptions  Pending Prescriptions Disp [place] 0.6 MG CAPS [Pharmacy Med Name: MITIGARE 0.6 MG CAPSULE] 30 capsule 2    Sig: TAKE ONE CAPSULE BY [place]     Endocrinology:  Gout Agents Passed - 08/26/2021 11:37 AM      Passed - Uric Acid in normal range and within 360 days    Uric Acid  Date Value Ref Range Status  05/18/2021 7.6 3.8 - 8.4 mg/dL Final    Comment:               Therapeutic target for gout patients: <6.0          Passed - Cr in normal range and within 360 days    Creatinine, Ser  Date Value Ref Range Status  04/20/2021 0.90 0.76 - 1.27 mg/dL Final          Passed - Valid encounter within last 12 months    Recent Outpatient Visits           3 months ago Acute gout involving toe, unspecified cause, unspecified laterality   [facility] Vigg, Avanti, MD   3 months ago Localized swelling of left foot   [place] Vigg, Avanti, MD   4 months ago Annual physical exam   [facility] Birdie Sons, MD   6 months ago Achilles tendinitis, left leg   [facility] [place], [place], [place]   1 year ago Annual physical exam   [facility] Birdie Sons, MD                 Requested Prescriptions  Pending Prescriptions Disp Refills   [place] 0.6 MG CAPS [Pharmacy Med Name: MITIGARE 0.6 MG CAPSULE] 30 capsule 2    Sig: TAKE ONE CAPSULE BY MOUTH EVERY MORNING     Endocrinology:  Gout Agents Passed - 08/26/2021 11:37 AM      Passed - Uric Acid in normal range and within 360 days    Uric Acid  Date Value Ref Range Status  05/18/2021 7.6 3.8 - 8.4 mg/dL Final    Comment:               Therapeutic target for gout patients:  <6.0          Passed - Cr in normal range and within 360 days    Creatinine, Ser  Date Value Ref Range Status  04/20/2021 0.90 0.76 - 1.27 mg/dL Final          Passed - Valid encounter within last 12 months    Recent Outpatient Visits           3 months ago Acute gout involving toe, unspecified cause, unspecified laterality   [facility] Vigg, Avanti, MD   3 months ago Localized swelling of left foot   [place] Vigg, Avanti, MD   4 months ago Annual physical exam   [facility] Birdie Sons, MD   6 months ago Achilles tendinitis, left leg   [facility] [place], Clearnce Sorrel, [place]   1 year ago Annual physical exam   [facility] Caryn Section, Kirstie Peri, MD

## 2021-08-30 ENCOUNTER — Ambulatory Visit: Payer: Medicare HMO | Admitting: Podiatry

## 2021-09-08 DIAGNOSIS — M9905 Segmental and somatic dysfunction of pelvic region: Secondary | ICD-10-CM | POA: Diagnosis not present

## 2021-09-08 DIAGNOSIS — M62830 Muscle spasm of back: Secondary | ICD-10-CM | POA: Diagnosis not present

## 2021-09-08 DIAGNOSIS — M9903 Segmental and somatic dysfunction of lumbar region: Secondary | ICD-10-CM | POA: Diagnosis not present

## 2021-09-08 DIAGNOSIS — M5136 Other intervertebral disc degeneration, lumbar region: Secondary | ICD-10-CM | POA: Diagnosis not present

## 2021-09-12 DIAGNOSIS — M9905 Segmental and somatic dysfunction of pelvic region: Secondary | ICD-10-CM | POA: Diagnosis not present

## 2021-09-12 DIAGNOSIS — M9903 Segmental and somatic dysfunction of lumbar region: Secondary | ICD-10-CM | POA: Diagnosis not present

## 2021-09-12 DIAGNOSIS — M62830 Muscle spasm of back: Secondary | ICD-10-CM | POA: Diagnosis not present

## 2021-09-12 DIAGNOSIS — M5136 Other intervertebral disc degeneration, lumbar region: Secondary | ICD-10-CM | POA: Diagnosis not present

## 2021-09-13 ENCOUNTER — Ambulatory Visit: Payer: Medicare HMO | Admitting: Podiatry

## 2021-09-13 ENCOUNTER — Other Ambulatory Visit: Payer: Self-pay

## 2021-09-13 DIAGNOSIS — M79675 Pain in left toe(s): Secondary | ICD-10-CM

## 2021-09-13 DIAGNOSIS — B351 Tinea unguium: Secondary | ICD-10-CM

## 2021-09-13 DIAGNOSIS — L853 Xerosis cutis: Secondary | ICD-10-CM

## 2021-09-13 DIAGNOSIS — M79674 Pain in right toe(s): Secondary | ICD-10-CM

## 2021-09-13 MED ORDER — DOXYCYCLINE HYCLATE 100 MG PO TABS: 100.0000 mg | ORAL_TABLET | Freq: Two times a day (BID) | ORAL | 0 refills | Status: DC

## 2021-09-14 DIAGNOSIS — M62830 Muscle spasm of back: Secondary | ICD-10-CM | POA: Diagnosis not present

## 2021-09-14 DIAGNOSIS — M9903 Segmental and somatic dysfunction of lumbar region: Secondary | ICD-10-CM | POA: Diagnosis not present

## 2021-09-14 DIAGNOSIS — M5136 Other intervertebral disc degeneration, lumbar region: Secondary | ICD-10-CM | POA: Diagnosis not present

## 2021-09-14 DIAGNOSIS — M9905 Segmental and somatic dysfunction of pelvic region: Secondary | ICD-10-CM | POA: Diagnosis not present

## 2021-09-15 DIAGNOSIS — M62830 Muscle spasm of back: Secondary | ICD-10-CM | POA: Diagnosis not present

## 2021-09-15 DIAGNOSIS — M9903 Segmental and somatic dysfunction of lumbar region: Secondary | ICD-10-CM | POA: Diagnosis not present

## 2021-09-15 DIAGNOSIS — M5136 Other intervertebral disc degeneration, lumbar region: Secondary | ICD-10-CM | POA: Diagnosis not present

## 2021-09-15 DIAGNOSIS — M9905 Segmental and somatic dysfunction of pelvic region: Secondary | ICD-10-CM | POA: Diagnosis not present

## 2021-09-16 ENCOUNTER — Encounter: Payer: Self-pay | Admitting: Podiatry

## 2021-09-16 NOTE — Progress Notes (Signed)
  Subjective:  Patient ID: Michael Hicks, male    DOB: [birth year],  MRN: [id number]  Chief Complaint  Patient presents with   Nail Problem    Nail trim    75 y.o. male returns for the above complaint.  Patient presents with thickened elongated dystrophic toenails x10.  Painful to touch.  Patient would like for me to debride down.  He is not diabetic.  He denies any other acute complaints.  He has secondary complaint of dry skin.  He would like to discuss treatment options for that.  He has not tried over-the-counter lotions.  He does not moisturize his foot.  Objective:  There were no vitals filed for this visit. Podiatric Exam: Vascular: dorsalis pedis and posterior tibial pulses are palpable bilateral. Capillary return is immediate. Temperature gradient is WNL. Skin turgor WNL  Sensorium: Normal Semmes Weinstein monofilament test. Normal tactile sensation bilaterally. Nail Exam: Pt has thick disfigured discolored nails with subungual debris noted bilateral entire nail hallux through fifth toenails.  Pain on palpation to the nails. Ulcer Exam: There is no evidence of ulcer or pre-ulcerative changes or infection. Orthopedic Exam: Muscle tone and strength are WNL. No limitations in general ROM. No crepitus or effusions noted. HAV  B/L.  Hammer toes 2-5  B/L. Skin: No Porokeratosis. No infection or ulcers.  Dry skin noted to both lower extremity.  No fissuring noted no ulceration noted.  No subjective component of itching noted    Assessment & Plan:   1. Xerosis of skin   2. Pain due to onychomycosis of toenails of both feet      Patient was evaluated and treated and all questions answered.  Xerosis skin -I explained to the patient the etiology of xerosis and various treatment options were extensively discussed.  I explained to the patient the importance of maintaining moisturization of the skin with application of over-the-counter lotion such as Eucerin or Luciderm.  I have asked  the patient to apply this twice a day.  If unable to resolve patient will benefit from prescription lotion.  Onychomycosis with pain  -Nails palliatively debrided as below. -Educated on self-care  Procedure: Nail Debridement Rationale: pain  Type of Debridement: manual, sharp debridement. Instrumentation: Nail nipper, rotary burr. Number of Nails: 10  Procedures and Treatment: Consent by patient was obtained for treatment procedures. The patient understood the discussion of treatment and procedures well. All questions were answered thoroughly reviewed. Debridement of mycotic and hypertrophic toenails, 1 through 5 bilateral and clearing of subungual debris. No ulceration, no infection noted.  Return Visit-Office Procedure: Patient instructed to return to the office for a follow up visit 3 months for continued evaluation and treatment.  Boneta Lucks, DPM    No follow-ups on file.

## 2021-09-19 DIAGNOSIS — M9903 Segmental and somatic dysfunction of lumbar region: Secondary | ICD-10-CM | POA: Diagnosis not present

## 2021-09-19 DIAGNOSIS — M62830 Muscle spasm of back: Secondary | ICD-10-CM | POA: Diagnosis not present

## 2021-09-19 DIAGNOSIS — M9905 Segmental and somatic dysfunction of pelvic region: Secondary | ICD-10-CM | POA: Diagnosis not present

## 2021-09-19 DIAGNOSIS — M5136 Other intervertebral disc degeneration, lumbar region: Secondary | ICD-10-CM | POA: Diagnosis not present

## 2021-09-20 DIAGNOSIS — M9905 Segmental and somatic dysfunction of pelvic region: Secondary | ICD-10-CM | POA: Diagnosis not present

## 2021-09-20 DIAGNOSIS — M9903 Segmental and somatic dysfunction of lumbar region: Secondary | ICD-10-CM | POA: Diagnosis not present

## 2021-09-20 DIAGNOSIS — M5136 Other intervertebral disc degeneration, lumbar region: Secondary | ICD-10-CM | POA: Diagnosis not present

## 2021-09-20 DIAGNOSIS — M62830 Muscle spasm of back: Secondary | ICD-10-CM | POA: Diagnosis not present

## 2021-09-22 DIAGNOSIS — M62830 Muscle spasm of back: Secondary | ICD-10-CM | POA: Diagnosis not present

## 2021-09-22 DIAGNOSIS — M5136 Other intervertebral disc degeneration, lumbar region: Secondary | ICD-10-CM | POA: Diagnosis not present

## 2021-09-22 DIAGNOSIS — M9905 Segmental and somatic dysfunction of pelvic region: Secondary | ICD-10-CM | POA: Diagnosis not present

## 2021-09-22 DIAGNOSIS — M9903 Segmental and somatic dysfunction of lumbar region: Secondary | ICD-10-CM | POA: Diagnosis not present

## 2021-09-26 DIAGNOSIS — M5136 Other intervertebral disc degeneration, lumbar region: Secondary | ICD-10-CM | POA: Diagnosis not present

## 2021-09-26 DIAGNOSIS — M9903 Segmental and somatic dysfunction of lumbar region: Secondary | ICD-10-CM | POA: Diagnosis not present

## 2021-09-26 DIAGNOSIS — M9905 Segmental and somatic dysfunction of pelvic region: Secondary | ICD-10-CM | POA: Diagnosis not present

## 2021-09-26 DIAGNOSIS — M62830 Muscle spasm of back: Secondary | ICD-10-CM | POA: Diagnosis not present

## 2021-09-28 DIAGNOSIS — M9905 Segmental and somatic dysfunction of pelvic region: Secondary | ICD-10-CM | POA: Diagnosis not present

## 2021-09-28 DIAGNOSIS — M9903 Segmental and somatic dysfunction of lumbar region: Secondary | ICD-10-CM | POA: Diagnosis not present

## 2021-09-28 DIAGNOSIS — M62830 Muscle spasm of back: Secondary | ICD-10-CM | POA: Diagnosis not present

## 2021-09-28 DIAGNOSIS — M5136 Other intervertebral disc degeneration, lumbar region: Secondary | ICD-10-CM | POA: Diagnosis not present

## 2021-10-31 ENCOUNTER — Other Ambulatory Visit: Payer: Self-pay

## 2021-10-31 ENCOUNTER — Ambulatory Visit: Payer: Medicare HMO | Admitting: Dermatology

## 2021-10-31 ENCOUNTER — Other Ambulatory Visit: Payer: Self-pay | Admitting: Dermatology

## 2021-10-31 DIAGNOSIS — L814 Other melanin hyperpigmentation: Secondary | ICD-10-CM | POA: Diagnosis not present

## 2021-10-31 DIAGNOSIS — D229 Melanocytic nevi, unspecified: Secondary | ICD-10-CM

## 2021-10-31 DIAGNOSIS — Z1283 Encounter for screening for malignant neoplasm of skin: Secondary | ICD-10-CM | POA: Diagnosis not present

## 2021-10-31 DIAGNOSIS — D1800 Hemangioma unspecified site: Secondary | ICD-10-CM | POA: Diagnosis not present

## 2021-10-31 DIAGNOSIS — D485 Neoplasm of uncertain behavior of skin: Secondary | ICD-10-CM | POA: Diagnosis not present

## 2021-10-31 DIAGNOSIS — L578 Other skin changes due to chronic exposure to nonionizing radiation: Secondary | ICD-10-CM | POA: Diagnosis not present

## 2021-10-31 DIAGNOSIS — L219 Seborrheic dermatitis, unspecified: Secondary | ICD-10-CM | POA: Diagnosis not present

## 2021-10-31 DIAGNOSIS — L570 Actinic keratosis: Secondary | ICD-10-CM | POA: Diagnosis not present

## 2021-10-31 DIAGNOSIS — L821 Other seborrheic keratosis: Secondary | ICD-10-CM | POA: Diagnosis not present

## 2021-10-31 DIAGNOSIS — L853 Xerosis cutis: Secondary | ICD-10-CM

## 2021-10-31 MED ORDER — KETOCONAZOLE 2 % EX CREA: TOPICAL_CREAM | CUTANEOUS | 3 refills | Status: DC

## 2021-10-31 NOTE — Progress Notes (Signed)
Follow-Up Visit   Subjective  Michael Hicks is a 75 y.o. male who presents for the following: UBSE (Hx AK's - S/P PDT on the face Jan 2022).   The following portions of the chart were reviewed this encounter and updated as appropriate:       Review of Systems:  No other skin or systemic complaints except as noted in HPI or Assessment and Plan.  Objective  Well appearing patient in no apparent distress; mood and affect are within normal limits.  All skin waist up examined.  R temple x 2, L forehead x 2, L mid cheek x 1, R anti helix x 1 (6) Erythematous thin papules/macules with gritty scale.   R med cheek 0.4 cm pink flesh papule      Face Dry flaky patches    Assessment & Plan  AK (actinic keratosis) (6) R temple x 2, L forehead x 2, L mid cheek x 1, R anti helix x 1  Actinic keratoses are precancerous spots that appear secondary to cumulative UV radiation exposure/sun exposure over time. They are chronic with expected duration over 1 year. A portion of actinic keratoses will progress to squamous cell carcinoma of the skin. It is not possible to reliably predict which spots will progress to skin cancer and so treatment is recommended to prevent development of skin cancer.  Recommend daily broad spectrum sunscreen SPF 30+ to sun-exposed areas, reapply every 2 hours as needed.  Recommend staying in the shade or wearing long sleeves, sun glasses (UVA+UVB protection) and wide brim hats (4-inch brim around the entire circumference of the hat). Call for new or changing lesions.   Destruction of lesion - R temple x 2, L forehead x 2, L mid cheek x 1, R anti helix x 1  Destruction method: cryotherapy   Informed consent: discussed and consent obtained   Timeout:  patient name, date of birth, surgical site, and procedure verified Lesion destroyed using liquid nitrogen: Yes   Region frozen until ice ball extended beyond lesion: Yes   Outcome: patient tolerated procedure  well with no complications   Post-procedure details: wound care instructions given   Additional details:  Prior to procedure, discussed risks of blister formation, small wound, skin dyspigmentation, or rare scar following cryotherapy. Recommend Vaseline ointment to treated areas while healing.   Neoplasm of uncertain behavior of skin R med cheek  Skin / nail biopsy Type of biopsy: tangential   Informed consent: discussed and consent obtained   Anesthesia: the lesion was anesthetized in a standard fashion   Anesthesia comment:  Area prepped with alcohol Anesthetic:  1% lidocaine w/ epinephrine 1-100,000 buffered w/ 8.4% NaHCO3 Instrument used: flexible razor blade   Hemostasis achieved with: pressure and aluminum chloride   Outcome: patient tolerated procedure well   Post-procedure details: wound care instructions given   Post-procedure details comment:  Ointment and small bandage applied  Specimen 1 - Surgical pathology Differential Diagnosis: D48.5 Sebaceous hyperplasia vs cyst r/o BCC  Check Margins: No  Sebaceous hyperplasia vs cyst r/o BCC - if BCC recommend excision  Seborrheic dermatitis Face  Seborrheic Dermatitis  -  is a chronic persistent rash characterized by pinkness and scaling most commonly of the mid face but also can occur on the scalp (dandruff), ears; mid chest, mid back and groin.  It tends to be exacerbated by stress and cooler weather.  People who have neurologic disease may experience new onset or exacerbation of existing seborrheic dermatitis.  The  condition is not curable but treatable and can be controlled.  Start Ketoconazole 2% cream QHS.   ketoconazole (NIZORAL) 2 % cream - Face Apply to the forehead, temples, and nose QHS.  Lentigines - Scattered tan macules - Due to sun exposure - Benign-appearing, observe - Recommend daily broad spectrum sunscreen SPF 30+ to sun-exposed areas, reapply every 2 hours as needed. - Call for any changes  Seborrheic  Keratoses - Stuck-on, waxy, tan-brown papules and/or plaques  - Benign-appearing - Discussed benign etiology and prognosis. - Observe - Call for any changes  Melanocytic Nevi - Tan-brown and/or pink-flesh-colored symmetric macules and papules - Benign appearing on exam today - Observation - Call clinic for new or changing moles - Recommend daily use of broad spectrum spf 30+ sunscreen to sun-exposed areas.   Hemangiomas - Red papules - Discussed benign nature - Observe - Call for any changes  Actinic Damage - Severe, confluent actinic changes with pre-cancerous actinic keratoses  - Severe, chronic, not at goal, secondary to cumulative UV radiation exposure over time - diffuse scaly erythematous macules and papules with underlying dyspigmentation - Discussed Prescription "Field Treatment" for Severe, Chronic Confluent Actinic Changes with Pre-Cancerous Actinic Keratoses Field treatment involves treatment of an entire area of skin that has confluent Actinic Changes (Sun/ Ultraviolet light damage) and PreCancerous Actinic Keratoses by method of PhotoDynamic Therapy (PDT) and/or prescription Topical Chemotherapy agents such as 5-fluorouracil, 5-fluorouracil/calcipotriene, and/or imiquimod.  The purpose is to decrease the number of clinically evident and subclinical PreCancerous lesions to prevent progression to development of skin cancer by chemically destroying early precancer changes that may or may not be visible.  It has been shown to reduce the risk of developing skin cancer in the treated area. As a result of treatment, redness, scaling, crusting, and open sores may occur during treatment course. One or more than one of these methods may be used and may have to be used several times to control, suppress and eliminate the PreCancerous changes. Discussed treatment course, expected reaction, and possible side effects. - Recommend daily broad spectrum sunscreen SPF 30+ to sun-exposed areas,  reapply every 2 hours as needed.  - Staying in the shade or wearing long sleeves, sun glasses (UVA+UVB protection) and wide brim hats (4-inch brim around the entire circumference of the hat) are also recommended. - Call for new or changing lesions. - Recommend PDT to the face (especially temples/lateral forehead) in 4-6 weeks.   Xerosis - diffuse xerotic patches - recommend gentle, hydrating skin care - gentle skin care handout given  Skin cancer screening performed today.  Return in about 1 year (around 10/31/2022) for UBSE; PDT to face in 4-6 weeks.  Luther Redo, CMA, am acting as scribe for Brendolyn Patty, MD .  Documentation: I have reviewed the above documentation for accuracy and completeness, and I agree with the above.  Brendolyn Patty MD

## 2021-10-31 NOTE — Patient Instructions (Addendum)

## 2021-11-01 ENCOUNTER — Telehealth: Payer: Self-pay

## 2021-11-01 NOTE — Telephone Encounter (Signed)
Advised patient of the right medial cheek was benign.

## 2021-11-01 NOTE — Telephone Encounter (Signed)
-----   Message from Brendolyn Patty, MD sent at 11/01/2021  4:01 PM EST ----- Skin , right med cheek [place]  Benign age spot and oil gland - please call patient

## 2021-12-06 ENCOUNTER — Other Ambulatory Visit: Payer: Self-pay | Admitting: Family Medicine

## 2021-12-06 NOTE — Telephone Encounter (Signed)
Medication Refill - Medication: MITIGARE 0.6 MG CAPS  Has the patient contacted their pharmacy? Yes.   (Agent: If no, request that the patient contact the pharmacy for the refill. If patient does not wish to contact the pharmacy document the reason why and proceed with request.) (Agent: If yes, when and what did the pharmacy advise?)  Preferred Pharmacy (with phone number or street name): [facility] [id number] [address], [place] Has the patient been seen for an appointment in the last year OR does the patient have an upcoming appointment? Yes.    Agent: Please be advised that RX refills may take up to 3 business days. We ask that you follow-up with your pharmacy.

## 2021-12-07 MED ORDER — MITIGARE 0.6 MG PO CAPS: 1.0000 | ORAL_CAPSULE | Freq: Every morning | ORAL | 5 refills | Status: DC

## 2021-12-07 NOTE — Telephone Encounter (Signed)
Requested medications are due for refill today.  unsure  Requested medications are on the active medications list.  yes  Last refill. 08/26/2021  Future visit scheduled.   yes  Notes to clinic.  Pt reported "not taking" 10/31/2021.    Requested Prescriptions  Pending Prescriptions Disp Refills   MITIGARE 0.6 MG CAPS 30 capsule 2    Sig: Take 1 capsule by mouth every morning.     Endocrinology:  Gout Agents Passed - 12/06/2021  6:41 PM      Passed - Uric Acid in normal range and within 360 days    Uric Acid  Date Value Ref Range Status  05/18/2021 7.6 3.8 - 8.4 mg/dL Final    Comment:               Therapeutic target for gout patients: <6.0          Passed - Cr in normal range and within 360 days    Creatinine, Ser  Date Value Ref Range Status  04/20/2021 0.90 0.76 - 1.27 mg/dL Final          Passed - Valid encounter within last 12 months    Recent Outpatient Visits           6 months ago Acute gout involving toe, unspecified cause, unspecified laterality   [facility] Vigg, Avanti, MD   6 months ago Localized swelling of left foot   [place] Vigg, Avanti, MD   7 months ago Annual physical exam   [facility] Birdie Sons, MD   9 months ago Achilles tendinitis, left leg   [facility] [place], Clearnce Sorrel, [place]   1 year ago Annual physical exam   [facility] Birdie Sons, MD       Future Appointments             In 4 months Fisher, Kirstie Peri, MD [facility], [place]   In 10 months Brendolyn Patty, MD [place]

## 2021-12-20 ENCOUNTER — Other Ambulatory Visit: Payer: Self-pay

## 2021-12-20 ENCOUNTER — Ambulatory Visit: Payer: Medicare HMO | Admitting: Podiatry

## 2021-12-20 ENCOUNTER — Encounter: Payer: Self-pay | Admitting: Podiatry

## 2021-12-20 DIAGNOSIS — M79675 Pain in left toe(s): Secondary | ICD-10-CM

## 2021-12-20 DIAGNOSIS — B351 Tinea unguium: Secondary | ICD-10-CM

## 2021-12-20 DIAGNOSIS — M79674 Pain in right toe(s): Secondary | ICD-10-CM

## 2021-12-20 NOTE — Progress Notes (Signed)
°  Subjective:  Patient ID: Michael Hicks, male    DOB: Jun 03, 1946,  MRN: [id number]  Chief Complaint  Patient presents with   Nail Problem    Nail trim    76 y.o. male returns for the above complaint.  Patient presents with thickened elongated dystrophic toenails x10.  Painful to touch.  Patient would like for me to debride down.  He is not diabetic.  He denies any other acute complaints.  He has secondary complaint of dry skin.  He would like to discuss treatment options for that.  He has not tried over-the-counter lotions.  He does not moisturize his foot.  Objective:  There were no vitals filed for this visit. Podiatric Exam: Vascular: dorsalis pedis and posterior tibial pulses are palpable bilateral. Capillary return is immediate. Temperature gradient is WNL. Skin turgor WNL  Sensorium: Normal Semmes Weinstein monofilament test. Normal tactile sensation bilaterally. Nail Exam: Pt has thick disfigured discolored nails with subungual debris noted bilateral entire nail hallux through fifth toenails.  Pain on palpation to the nails. Ulcer Exam: There is no evidence of ulcer or pre-ulcerative changes or infection. Orthopedic Exam: Muscle tone and strength are WNL. No limitations in general ROM. No crepitus or effusions noted. HAV  B/L.  Hammer toes 2-5  B/L. Skin: No Porokeratosis. No infection or ulcers.  Dry skin noted to both lower extremity.  No fissuring noted no ulceration noted.  No subjective component of itching noted    Assessment & Plan:   1. Pain due to onychomycosis of toenails of both feet       Patient was evaluated and treated and all questions answered.  Xerosis skin -I explained to the patient the etiology of xerosis and various treatment options were extensively discussed.  I explained to the patient the importance of maintaining moisturization of the skin with application of over-the-counter lotion such as Eucerin or Luciderm.  I have asked the patient to apply  this twice a day.  If unable to resolve patient will benefit from prescription lotion.  Onychomycosis with pain  -Nails palliatively debrided as below. -Educated on self-care  Procedure: Nail Debridement Rationale: pain  Type of Debridement: manual, sharp debridement. Instrumentation: Nail nipper, rotary burr. Number of Nails: 10  Procedures and Treatment: Consent by patient was obtained for treatment procedures. The patient understood the discussion of treatment and procedures well. All questions were answered thoroughly reviewed. Debridement of mycotic and hypertrophic toenails, 1 through 5 bilateral and clearing of subungual debris. No ulceration, no infection noted.  Return Visit-Office Procedure: Patient instructed to return to the office for a follow up visit 3 months for continued evaluation and treatment.  Boneta Lucks, DPM    No follow-ups on file.

## 2022-01-10 ENCOUNTER — Ambulatory Visit (INDEPENDENT_AMBULATORY_CARE_PROVIDER_SITE_OTHER): Payer: Medicare HMO

## 2022-01-10 DIAGNOSIS — Z0000 Encounter for general adult medical examination without abnormal findings: Secondary | ICD-10-CM | POA: Diagnosis not present

## 2022-01-10 NOTE — Patient Instructions (Signed)
Michael Hicks , Thank you for taking time to come for your Medicare Wellness Visit. I appreciate your ongoing commitment to your health goals. Please review the following plan we discussed and let me know if I can assist you in the future.   Screening recommendations/referrals: Colonoscopy: 09/10/20 Recommended yearly ophthalmology/optometry visit for glaucoma screening and checkup Recommended yearly dental visit for hygiene and checkup  Vaccinations: Influenza vaccine: 08/26/21 Pneumococcal vaccine: 11/25/14 Tdap vaccine: 11/22/12 Shingles vaccine: Shingrix 09/05/19, 06/11/20    Covid-19: 01/27/20, 02/17/20, 09/13/20, 08/31/21  Advanced directives: no  Conditions/risks identified: none  Next appointment: Follow up in one year for your annual wellness visit.   Preventive Care 74 Years and Older, Male Preventive care refers to lifestyle choices and visits with your health care provider that can promote health and wellness. What does preventive care include? A yearly physical exam. This is also called an annual well check. Dental exams once or twice a year. Routine eye exams. Ask your health care provider how often you should have your eyes checked. Personal lifestyle choices, including: Daily care of your teeth and gums. Regular physical activity. Eating a healthy diet. Avoiding tobacco and drug use. Limiting alcohol use. Practicing safe sex. Taking low doses of aspirin every day. Taking vitamin and mineral supplements as recommended by your health care provider. What happens during an annual well check? The services and screenings done by your health care provider during your annual well check will depend on your age, overall health, lifestyle risk factors, and family history of disease. Counseling  Your health care provider may ask you questions about your: Alcohol use. Tobacco use. Drug use. Emotional well-being. Home and relationship well-being. Sexual activity. Eating  habits. History of falls. Memory and ability to understand (cognition). Work and work Statistician. Screening  You may have the following tests or measurements: Height, weight, and BMI. Blood pressure. Lipid and cholesterol levels. These may be checked every 5 years, or more frequently if you are over 48 years old. Skin check. Lung cancer screening. You may have this screening every year starting at age 62 if you have a 30-pack-year history of smoking and currently smoke or have quit within the past 15 years. Fecal occult blood test (FOBT) of the stool. You may have this test every year starting at age 21. Flexible sigmoidoscopy or colonoscopy. You may have a sigmoidoscopy every 5 years or a colonoscopy every 10 years starting at age 52. Prostate cancer screening. Recommendations will vary depending on your family history and other risks. Hepatitis C blood test. Hepatitis B blood test. Sexually transmitted disease (STD) testing. Diabetes screening. This is done by checking your blood sugar (glucose) after you have not eaten for a while (fasting). You may have this done every 1-3 years. Abdominal aortic aneurysm (AAA) screening. You may need this if you are a current or former smoker. Osteoporosis. You may be screened starting at age 22 if you are at high risk. Talk with your health care provider about your test results, treatment options, and if necessary, the need for more tests. Vaccines  Your health care provider may recommend certain vaccines, such as: Influenza vaccine. This is recommended every year. Tetanus, diphtheria, and acellular pertussis (Tdap, Td) vaccine. You may need a Td booster every 10 years. Zoster vaccine. You may need this after age 51. Pneumococcal 13-valent conjugate (PCV13) vaccine. One dose is recommended after age 60. Pneumococcal polysaccharide (PPSV23) vaccine. One dose is recommended after age 52. Talk to your health care provider  about which screenings and  vaccines you need and how often you need them. This information is not intended to replace advice given to you by your health care provider. Make sure you discuss any questions you have with your health care provider. Document Released: 12/17/2015 Document Revised: 08/09/2016 Document Reviewed: 09/21/2015 Elsevier Interactive Patient Education  2017 [place] Prevention in the Home Falls can cause injuries. They can happen to people of all ages. There are many things you can do to make your home safe and to help prevent falls. What can I do on the outside of my home? Regularly fix the edges of walkways and driveways and fix any cracks. Remove anything that might make you trip as you walk through a door, such as a raised step or threshold. Trim any bushes or trees on the path to your home. Use bright outdoor lighting. Clear any walking paths of anything that might make someone trip, such as rocks or tools. Regularly check to see if handrails are loose or broken. Make sure that both sides of any steps have handrails. Any raised decks and porches should have guardrails on the edges. Have any leaves, snow, or ice cleared regularly. Use sand or salt on walking paths during winter. Clean up any spills in your garage right away. This includes oil or grease spills. What can I do in the bathroom? Use night lights. Install grab bars by the toilet and in the tub and shower. Do not use towel bars as grab bars. Use non-skid mats or decals in the tub or shower. If you need to sit down in the shower, use a plastic, non-slip stool. Keep the floor dry. Clean up any water that spills on the floor as soon as it happens. Remove soap buildup in the tub or shower regularly. Attach bath mats securely with double-sided non-slip rug tape. Do not have throw rugs and other things on the floor that can make you trip. What can I do in the bedroom? Use night lights. Make sure that you have a light by your  bed that is easy to reach. Do not use any sheets or blankets that are too big for your bed. They should not hang down onto the floor. Have a firm chair that has side arms. You can use this for support while you get dressed. Do not have throw rugs and other things on the floor that can make you trip. What can I do in the kitchen? Clean up any spills right away. Avoid walking on wet floors. Keep items that you use a lot in easy-to-reach places. If you need to reach something above you, use a strong step stool that has a grab bar. Keep electrical cords out of the way. Do not use floor polish or wax that makes floors slippery. If you must use wax, use non-skid floor wax. Do not have throw rugs and other things on the floor that can make you trip. What can I do with my stairs? Do not leave any items on the stairs. Make sure that there are handrails on both sides of the stairs and use them. Fix handrails that are broken or loose. Make sure that handrails are as long as the stairways. Check any carpeting to make sure that it is firmly attached to the stairs. Fix any carpet that is loose or worn. Avoid having throw rugs at the top or bottom of the stairs. If you do have throw rugs, attach them to the floor  with carpet tape. Make sure that you have a light switch at the top of the stairs and the bottom of the stairs. If you do not have them, ask someone to add them for you. What else can I do to help prevent falls? Wear shoes that: Do not have high heels. Have rubber bottoms. Are comfortable and fit you well. Are closed at the toe. Do not wear sandals. If you use a stepladder: Make sure that it is fully opened. Do not climb a closed stepladder. Make sure that both sides of the stepladder are locked into place. Ask someone to hold it for you, if possible. Clearly mark and make sure that you can see: Any grab bars or handrails. First and last steps. Where the edge of each step is. Use tools that  help you move around (mobility aids) if they are needed. These include: Canes. Walkers. Scooters. Crutches. Turn on the lights when you go into a dark area. Replace any light bulbs as soon as they burn out. Set up your furniture so you have a clear path. Avoid moving your furniture around. If any of your floors are uneven, fix them. If there are any pets around you, be aware of where they are. Review your medicines with your doctor. Some medicines can make you feel dizzy. This can increase your chance of falling. Ask your doctor what other things that you can do to help prevent falls. This information is not intended to replace advice given to you by your health care provider. Make sure you discuss any questions you have with your health care provider. Document Released: 09/16/2009 Document Revised: 04/27/2016 Document Reviewed: 12/25/2014 Elsevier Interactive Patient Education  2017 [employer].

## 2022-01-10 NOTE — Progress Notes (Signed)
Virtual Visit via Telephone Note  I connected with  Michael Hicks on 01/10/22 at  9:40 AM EST by telephone and verified that I am speaking with the correct person using two identifiers.  Location: Patient: home  Provider: [facility] Persons participating in the virtual visit: [place]   I discussed the limitations, risks, security and privacy concerns of performing an evaluation and management service by telephone and the availability of in person appointments. The patient expressed understanding and agreed to proceed.  Interactive audio and video telecommunications were attempted between this nurse and patient, however failed, due to patient having technical difficulties OR patient did not have access to video capability.  We continued and completed visit with audio only.  Some vital signs may be absent or patient reported.   Dionisio David, LPN  Subjective:   Michael Hicks is a 76 y.o. male who presents for Medicare Annual/Subsequent preventive examination.  Review of Systems           Objective:    There were no vitals filed for this visit. There is no height or weight on file to calculate BMI.  Advanced Directives 01/04/2021 09/10/2020 12/31/2019 12/27/2018 12/17/2017 07/26/2017 12/13/2016  Does Patient Have a Medical Advance Directive? No Yes Yes No No Yes No  Type of Advance Directive - Healthcare Power of [place];Living will - - - -  Does patient want to make changes to medical advance directive? - No - Patient declined - Yes (MAU/Ambulatory/Procedural Areas - Information given) - Yes (MAU/Ambulatory/Procedural Areas - Information given) -  Copy of [place] in Chart? - Yes - validated most recent copy scanned in chart (See row information) No - copy requested - - - -  Would patient like information on creating a medical advance directive? Yes (ED - Information included in AVS) No - Patient declined - - Yes  (MAU/Ambulatory/Procedural Areas - Information given) - Yes (MAU/Ambulatory/Procedural Areas - Information given)    Current Medications (verified) Outpatient Encounter Medications as of 01/10/2022  Medication Sig   colchicine (COLCRYS) 0.6 MG tablet TAKE 2 TABLETS BY MOUTH TODAY, THEN 1 TABLET DAILY (Patient not taking: Reported on 10/31/2021)   FLUZONE HIGH-DOSE QUADRIVALENT 0.7 ML SUSY    ketoconazole (NIZORAL) 2 % cream Apply to the forehead, temples, and nose QHS.   MITIGARE 0.6 MG CAPS Take 1 capsule by mouth every morning.   Multiple Vitamins-Minerals (MULTIVITAMIN WITH MINERALS) tablet Take 1 tablet by mouth daily. (Patient not taking: Reported on 10/31/2021)   naproxen sodium (ALEVE) 220 MG tablet Take 220 mg by mouth daily as needed. (Patient not taking: Reported on 10/31/2021)   PFIZER COVID-19 VAC BIVALENT injection    sildenafil (REVATIO) 20 MG tablet TAKE TWO AND A HALF (2.5) TABLETS BY MOUTH 30 MINUTES BEFORE SEXUAL INTERCOURSE (Patient not taking: Reported on 10/31/2021)   No facility-administered encounter medications on file as of 01/10/2022.    Allergies (verified) Patient has no known allergies.   History: Past Medical History:  Diagnosis Date   COVID-19 07/29/2020   HX   Erectile dysfunction    Gout    Hx of colonic polyps    Precancerous skin lesion    SOB (shortness of breath)    Past Surgical History:  Procedure Laterality Date   COLONOSCOPY     COLONOSCOPY WITH PROPOFOL N/A 01/14/2016   Procedure: COLONOSCOPY WITH PROPOFOL;  Surgeon: Lucilla Lame, MD;  Location: [facility];  Service: Endoscopy;  Laterality:  N/A;   COLONOSCOPY WITH PROPOFOL N/A 07/26/2017   Procedure: COLONOSCOPY WITH PROPOFOL;  Surgeon: Lucilla Lame, MD;  Location: [facility];  Service: Gastroenterology;  Laterality: N/A;   COLONOSCOPY WITH PROPOFOL N/A 09/10/2020   Procedure: COLONOSCOPY WITH PROPOFOL;  Surgeon: Lucilla Lame, MD;  Location: [facility];  Service:  Endoscopy;  Laterality: N/A;  priority 4 COVID (+) 07/28/20   POLYPECTOMY  01/14/2016   Procedure: POLYPECTOMY;  Surgeon: Lucilla Lame, MD;  Location: [facility];  Service: Endoscopy;;   POLYPECTOMY  09/10/2020   Procedure: POLYPECTOMY;  Surgeon: Lucilla Lame, MD;  Location: [facility];  Service: Endoscopy;;   Family History  Problem Relation Age of Onset   COPD Mother    Heart disease Mother    Hypertension Father    Cancer Brother        bone cancer   Social History   Socioeconomic History   Marital status: Divorced    Spouse name: Not on file   Number of children: 1   Years of education: 14   Highest education level: Associate degree: occupational, Hotel manager, or vocational program  Occupational History   Occupation: Finance    Comment: RETIRED  Tobacco Use   Smoking status: Former    Packs/day: 1.00    Years: 15.00    Pack years: 15.00    Types: Cigarettes    Quit date: 12/04/1990    Years since quitting: 31.1   Smokeless tobacco: Never  Vaping Use   Vaping Use: Never used  Substance and Sexual Activity   Alcohol use: Yes    Alcohol/week: 14.0 - 21.0 standard drinks    Types: 14 - 21 Cans of beer per week    Comment: 2-3 beers a night   Drug use: No   Sexual activity: Yes  Other Topics Concern   Not on file  Social History Narrative   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    Social Determinants of Health   Financial Resource Strain: Not on file  Food Insecurity: Not on file  Transportation Needs: Not on file  Physical Activity: Not on file  Stress: Not on file  Social Connections: Not on file    Tobacco Counseling Counseling given: Not Answered   Clinical Intake:  Pre-visit preparation completed: Yes  Pain : No/denies pain     Nutritional Risks: None Diabetes: Yes CBG done?: No Did pt. bring in CBG monitor from home?: No  How often do you need to have someone help you when you read instructions, pamphlets, or other  written materials from your doctor or pharmacy?: 1 - Never  Diabetic?yes Nutrition Risk Assessment:  Has the patient had any N/V/D within the last 2 months?  No  Does the patient have any non-healing wounds?  No  Has the patient had any unintentional weight loss or weight gain?  No   Diabetes:  Is the patient diabetic?  Yes  If diabetic, was a CBG obtained today?  No  Did the patient bring in their glucometer from home?  No  How often do you monitor your CBG's? never.   Financial Strains and Diabetes Management:  Are you having any financial strains with the device, your supplies or your medication? No .  Does the patient want to be seen by Chronic Care Management for management of their diabetes?  No  Would the patient like to be referred to a Nutritionist or for Diabetic Management?  No   Diabetic Exams:  Diabetic  Eye Exam: Completed no. Overdue for diabetic eye exam. Pt has been advised about the importance in completing this exam.   Diabetic Foot Exam: Completed 06/16/21. Pt has been advised about the importance in completing this exam.   Interpreter Needed?: No  Information entered by :: Kirke Shaggy, LPN   Activities of Daily Living In your present state of health, do you have any difficulty performing the following activities: 04/20/2021  Hearing? N  Vision? N  Difficulty concentrating or making decisions? N  Walking or climbing stairs? Y  Dressing or bathing? N  Doing errands, shopping? N  Some recent data might be hidden    Patient Care Team: Birdie Sons, MD as PCP - General (Family Medicine) Idelle Leech, OD as Consulting Physician (Optometry) Brendolyn Patty, MD (Dermatology) Lucilla Lame, MD as Consulting Physician (Gastroenterology)  Indicate any recent Medical Services you may have received from other than [facility] providers in the past year (date may be approximate).     Assessment:   This is a routine wellness examination for  Michael Hicks.  Hearing/Vision screen No results found.  Dietary issues and exercise activities discussed:     Goals Addressed   None    Depression Screen PHQ 2/9 Scores 05/24/2021 05/18/2021 04/20/2021 01/04/2021 12/31/2019 12/31/2019 12/27/2018  PHQ - 2 Score 0 0 0 0 0 0 0  PHQ- 9 Score - - 0 - - - 0    Fall Risk Fall Risk  05/24/2021 05/18/2021 04/20/2021 01/04/2021 04/02/2020  Falls in the past year? 0 0 0 0 0  Number falls in past yr: 0 0 0 0 0  Injury with Fall? 0 0 0 0 0  Risk for fall due to : No Fall Risks No Fall Risks - - -  Follow up Falls evaluation completed Falls evaluation completed Falls evaluation completed - Falls evaluation completed    FALL RISK PREVENTION PERTAINING TO THE HOME:  Any stairs in or around the home? Yes  If so, are there any without handrails? No  Home free of loose throw rugs in walkways, pet beds, electrical cords, etc? Yes  Adequate lighting in your home to reduce risk of falls? Yes   ASSISTIVE DEVICES UTILIZED TO PREVENT FALLS:  Life alert? No  Use of a cane, walker or w/c? No  Grab bars in the bathroom? No  Shower chair or bench in shower? No  Elevated toilet seat or a handicapped toilet? No    Cognitive Function:Normal cognitive status assessed by direct observation by this Nurse Health Advisor. No abnormalities found.       6CIT Screen 12/27/2018 12/17/2017  What Year? 0 points 0 points  What month? 0 points 0 points  What time? 0 points 0 points  Count back from 20 0 points 0 points  Months in reverse 0 points 0 points  Repeat phrase 0 points 4 points  Total Score 0 4    Immunizations Immunization History  Administered Date(s) Administered   Influenza, High Dose Seasonal PF 10/20/2016, 09/28/2017, 09/03/2018, 09/11/2020   Influenza,inj,Quad PF,6+ Mos 12/01/2015   Influenza-Unspecified 09/28/2017, 09/05/2019   PFIZER(Purple Top)SARS-COV-2 Vaccination 01/27/2020, 02/17/2020, 09/13/2020   Pneumococcal Conjugate-13 11/24/2013    Pneumococcal Polysaccharide-23 11/25/2014   Td 11/22/2012   Tdap 11/22/2012   Zoster Recombinat (Shingrix) 09/05/2019, 06/11/2020   Zoster, Live 11/22/2012    TDAP status: Up to date  Flu Vaccine status: Up to date  Pneumococcal vaccine status: Up to date  Covid-19 vaccine status: Completed vaccines  Qualifies for  Shingles Vaccine? Yes   Zostavax completed No   Shingrix Completed?: Yes  Screening Tests Health Maintenance  Topic Date Due   COVID-19 Vaccine (4 - Booster for Pfizer series) 11/08/2020   INFLUENZA VACCINE  07/04/2021   TETANUS/TDAP  11/22/2022   COLONOSCOPY (Pts 45-59yrs Insurance coverage will need to be confirmed)  09/10/2025   Pneumonia Vaccine 87+ Years old  Completed   Hepatitis C Screening  Completed   Zoster Vaccines- Shingrix  Completed   HPV VACCINES  Aged Out    Health Maintenance  Health Maintenance Due  Topic Date Due   COVID-19 Vaccine (4 - Booster for [place] series) 11/08/2020   INFLUENZA VACCINE  07/04/2021    Colorectal cancer screening: Type of screening: Colonoscopy. Completed 09/10/20. Repeat every 5 years  Lung Cancer Screening: (Low Dose CT Chest recommended if Age 73-80 years, 30 pack-year currently smoking OR have quit w/in 15years.) does not qualify.   Additional Screening:  Hepatitis C Screening: does qualify; Completed 11/24/11  Vision Screening: Recommended annual ophthalmology exams for early detection of glaucoma and other disorders of the eye. Is the patient up to date with their annual eye exam?  Yes  Who is the provider or what is the name of the office in which the patient attends annual eye exams? Dr.Nice If pt is not established with a provider, would they like to be referred to a provider to establish care? No .   Dental Screening: Recommended annual dental exams for proper oral hygiene  Community Resource Referral / Chronic Care Management: CRR required this visit?  No   CCM required this visit?  No       Plan:     I have personally reviewed and noted the following in the patients chart:   Medical and social history Use of alcohol, tobacco or illicit drugs  Current medications and supplements including opioid prescriptions. Patient is not currently taking opioid prescriptions. Functional ability and status Nutritional status Physical activity Advanced directives List of other physicians Hospitalizations, surgeries, and ER visits in previous 12 months Vitals Screenings to include cognitive, depression, and falls Referrals and appointments  In addition, I have reviewed and discussed with patient certain preventive protocols, quality metrics, and best practice recommendations. A written personalized care plan for preventive services as well as general preventive health recommendations were provided to patient.     Dionisio David, LPN   02/09/7563   Nurse Notes: none

## 2022-02-21 DIAGNOSIS — H2513 Age-related nuclear cataract, bilateral: Secondary | ICD-10-CM | POA: Diagnosis not present

## 2022-02-21 DIAGNOSIS — H5213 Myopia, bilateral: Secondary | ICD-10-CM | POA: Diagnosis not present

## 2022-03-23 ENCOUNTER — Ambulatory Visit: Payer: Medicare HMO | Admitting: Podiatry

## 2022-03-23 DIAGNOSIS — M79674 Pain in right toe(s): Secondary | ICD-10-CM | POA: Diagnosis not present

## 2022-03-23 DIAGNOSIS — M79675 Pain in left toe(s): Secondary | ICD-10-CM

## 2022-03-23 DIAGNOSIS — B351 Tinea unguium: Secondary | ICD-10-CM

## 2022-03-29 NOTE — Progress Notes (Signed)
?  Subjective:  Patient ID: Michael Hicks, male    DOB: 05/31/1946,  MRN: [id number]  Chief Complaint  Patient presents with   Nail Problem    Nail trim   75 y.o. male returns for the above complaint.  Patient presents with thickened elongated dystrophic toenails x10.  Painful to touch.  Patient would like for me to debride down.  He is not diabetic.  He denies any other acute complaints.  Does not have any secondary complaints today  Objective:  There were no vitals filed for this visit. Podiatric Exam: Vascular: dorsalis pedis and posterior tibial pulses are palpable bilateral. Capillary return is immediate. Temperature gradient is WNL. Skin turgor WNL  Sensorium: Normal Semmes Weinstein monofilament test. Normal tactile sensation bilaterally. Nail Exam: Pt has thick disfigured discolored nails with subungual debris noted bilateral entire nail hallux through fifth toenails.  Pain on palpation to the nails. Ulcer Exam: There is no evidence of ulcer or pre-ulcerative changes or infection. Orthopedic Exam: Muscle tone and strength are WNL. No limitations in general ROM. No crepitus or effusions noted. HAV  B/L.  Hammer toes 2-5  B/L. Skin: No Porokeratosis. No infection or ulcers.  No further dry skin noted    Assessment & Plan:   No diagnosis found.     Patient was evaluated and treated and all questions answered.  Xerosis skin -Clinically resolving with over-the-counter medications  Onychomycosis with pain  -Nails palliatively debrided as below. -Educated on self-care  Procedure: Nail Debridement Rationale: pain  Type of Debridement: manual, sharp debridement. Instrumentation: Nail nipper, rotary burr. Number of Nails: 10  Procedures and Treatment: Consent by patient was obtained for treatment procedures. The patient understood the discussion of treatment and procedures well. All questions were answered thoroughly reviewed. Debridement of mycotic and hypertrophic  toenails, 1 through 5 bilateral and clearing of subungual debris. No ulceration, no infection noted.  Return Visit-Office Procedure: Patient instructed to return to the office for a follow up visit 3 months for continued evaluation and treatment.  Dayvian Blixt, DPM    No follow-ups on file.  

## 2022-05-03 ENCOUNTER — Encounter: Payer: Self-pay | Admitting: Family Medicine

## 2022-05-03 ENCOUNTER — Ambulatory Visit (INDEPENDENT_AMBULATORY_CARE_PROVIDER_SITE_OTHER): Payer: Medicare HMO | Admitting: Family Medicine

## 2022-05-03 VITALS — BP 129/81 | HR 61 | Temp 98.3°F | Resp 16 | Ht 72.0 in | Wt 183.0 lb

## 2022-05-03 DIAGNOSIS — Z0000 Encounter for general adult medical examination without abnormal findings: Secondary | ICD-10-CM | POA: Diagnosis not present

## 2022-05-03 DIAGNOSIS — Z125 Encounter for screening for malignant neoplasm of prostate: Secondary | ICD-10-CM

## 2022-05-03 DIAGNOSIS — H6123 Impacted cerumen, bilateral: Secondary | ICD-10-CM | POA: Diagnosis not present

## 2022-05-03 DIAGNOSIS — E782 Mixed hyperlipidemia: Secondary | ICD-10-CM

## 2022-05-03 NOTE — Progress Notes (Signed)
Complete physical exam   Patient: Michael Hicks   DOB: January 09, 1946   76 y.o. Male  MRN: [id number] Visit Date: 05/03/2022  Today's healthcare provider: Lelon Huh, MD   Chief Complaint  Patient presents with   Annual Exam   Subjective    Michael Hicks is a 76 y.o. male who presents today for a complete physical exam.  He reports consuming a general diet. Home exercise routine includes walking and hike. He generally feels well. He reports sleeping fairly well. He does not have additional problems to discuss today.  HPI  Patient had AWV with [facility] on 01/10/2022.  Last Colonoscopy: 09/10/2020  Past Medical History:  Diagnosis Date   COVID-19 07/29/2020   HX   Erectile dysfunction    Gout    Hx of colonic polyps    Precancerous skin lesion    SOB (shortness of breath)    Past Surgical History:  Procedure Laterality Date   COLONOSCOPY     COLONOSCOPY WITH PROPOFOL N/A 01/14/2016   Procedure: COLONOSCOPY WITH PROPOFOL;  Surgeon: Lucilla Lame, MD;  Location: [place];  Service: Endoscopy;  Laterality: N/A;   COLONOSCOPY WITH PROPOFOL N/A 07/26/2017   Procedure: COLONOSCOPY WITH PROPOFOL;  Surgeon: Lucilla Lame, MD;  Location: [place];  Service: Gastroenterology;  Laterality: N/A;   COLONOSCOPY WITH PROPOFOL N/A 09/10/2020   Procedure: COLONOSCOPY WITH PROPOFOL;  Surgeon: Lucilla Lame, MD;  Location: [place];  Service: Endoscopy;  Laterality: N/A;  priority 4 COVID (+) 07/28/20   POLYPECTOMY  01/14/2016   Procedure: POLYPECTOMY;  Surgeon: Lucilla Lame, MD;  Location: [place];  Service: Endoscopy;;   POLYPECTOMY  09/10/2020   Procedure: POLYPECTOMY;  Surgeon: Lucilla Lame, MD;  Location: [place];  Service: Endoscopy;;   Social History   Socioeconomic History   Marital status: Divorced    Spouse name: Not on file   Number of children: 1   Years of education: 14   Highest education level: Associate degree:  occupational, Hotel manager, or vocational program  Occupational History   Occupation: Finance    Comment: RETIRED  Tobacco Use   Smoking status: Former    Packs/day: 1.00    Years: 15.00    Pack years: 15.00    Types: Cigarettes    Quit date: 12/04/1990    Years since quitting: 31.4   Smokeless tobacco: Never  Vaping Use   Vaping Use: Never used  Substance and Sexual Activity   Alcohol use: Yes    Alcohol/week: 14.0 - 21.0 standard drinks    Types: 14 - 21 Cans of beer per week    Comment: 2-3 beers a night   Drug use: No   Sexual activity: Yes  Other Topics Concern   Not on file  Social History Narrative   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    Social Determinants of Health   Financial Resource Strain: Low Risk    Difficulty of Paying Living Expenses: Not hard at all  Food Insecurity: No Food Insecurity   Worried About Charity fundraiser in the Last Year: Never true   Patriot in the Last Year: Never true  Transportation Needs: No Transportation Needs   Lack of Transportation (Medical): No   Lack of Transportation (Non-Medical): No  Physical Activity: Insufficiently Active   Days of Exercise per Week: 4 days   Minutes of Exercise per Session: 30 min  Stress: No Stress Concern Present  Feeling of Stress : Not at all  Social Connections: Moderately Integrated   Frequency of Communication with Friends and Family: More than three times a week   Frequency of Social Gatherings with Friends and Family: More than three times a week   Attends Religious Services: More than 4 times per year   Active Member of Clubs or Organizations: Yes   Attends Music therapist: More than 4 times per year   Marital Status: Divorced  Human resources officer Violence: Not At Risk   Fear of Current or Ex-Partner: No   Emotionally Abused: No   Physically Abused: No   Sexually Abused: No   Family Status  Relation Name Status   Mother  Deceased   Father  Deceased        cerebrovascular accident   Brother  Deceased   Brother  Alive   Brother  Alive   Family History  Problem Relation Age of Onset   COPD Mother    Heart disease Mother    Hypertension Father    Cancer Brother        bone cancer   No Known Allergies  Patient Care Team: Birdie Sons, MD as PCP - General (Family Medicine) Idelle Leech, OD as Consulting Physician (Optometry) Brendolyn Patty, MD (Dermatology) Lucilla Lame, MD as Consulting Physician (Gastroenterology)   Medications: Outpatient Medications Prior to Visit  Medication Sig   ketoconazole (NIZORAL) 2 % cream Apply to the forehead, temples, and nose QHS.   MITIGARE 0.6 MG CAPS Take 1 capsule by mouth every morning.   Multiple Vitamins-Minerals (MULTIVITAMIN WITH MINERALS) tablet Take 1 tablet by mouth daily.   naproxen sodium (ALEVE) 220 MG tablet Take 220 mg by mouth daily as needed.   sildenafil (REVATIO) 20 MG tablet TAKE TWO AND A HALF (2.5) TABLETS BY MOUTH 30 MINUTES BEFORE SEXUAL INTERCOURSE   colchicine (COLCRYS) 0.6 MG tablet TAKE 2 TABLETS BY MOUTH TODAY, THEN 1 TABLET DAILY (Patient not taking: Reported on 01/10/2022)   [DISCONTINUED] FLUZONE HIGH-DOSE QUADRIVALENT 0.7 ML SUSY  (Patient not taking: Reported on 05/03/2022)   [DISCONTINUED] PFIZER COVID-19 VAC BIVALENT injection  (Patient not taking: Reported on 05/03/2022)   No facility-administered medications prior to visit.    Review of Systems  Constitutional:  Positive for appetite change. Negative for chills and fever.  Respiratory:  Negative for chest tightness, shortness of breath and wheezing.   Cardiovascular:  Negative for chest pain and palpitations.  Gastrointestinal:  Negative for abdominal pain, nausea and vomiting.  Musculoskeletal:  Positive for arthralgias.     Objective     BP 129/81 (BP Location: Left Arm, Patient Position: Sitting, Cuff Size: Large)   Pulse 61   Temp 98.3 F (36.8 C) (Temporal)   Resp 16   Ht 6' (1.829 m)   Wt 183  lb (83 kg)   SpO2 99%   BMI 24.82 kg/m     Physical Exam   General Appearance:    Well developed, well nourished male. Alert, cooperative, in no acute distress, appears stated age  Head:    Normocephalic, without obvious abnormality, atraumatic  Eyes:    PERRL, conjunctiva/corneas clear, EOM's intact, fundi    benign, both eyes       Ears:    Tms obstructed by excessive cerumen in both ears.   Nose:   Nares normal, septum midline, mucosa normal, no drainage   or sinus tenderness  Throat:   Lips, mucosa, and tongue normal; teeth and gums  normal  Neck:   Supple, symmetrical, trachea midline, no adenopathy;       thyroid:  No enlargement/tenderness/nodules; no carotid   bruit or JVD  Back:     Symmetric, no curvature, ROM normal, no CVA tenderness  Lungs:     Clear to auscultation bilaterally, respirations unlabored  Chest wall:    No tenderness or deformity  Heart:    Normal heart rate. Normal rhythm. No murmurs, rubs, or gallops.  S1 and S2 normal  Abdomen:     Soft, non-tender, bowel sounds active all four quadrants,    no masses, no organomegaly  Genitalia:    deferred  Rectal:    deferred  Extremities:   Trace edema on left.   Pulses:   2+ and symmetric all extremities  Skin:   Skin color, texture, turgor normal, no rashes or lesions  Lymph nodes:   Cervical, supraclavicular, and axillary nodes normal  Neurologic:   CNII-XII intact. Normal strength, sensation and reflexes      throughout     Last depression screening scores    05/03/2022   10:53 AM 01/10/2022    9:43 AM 05/24/2021    9:40 AM  PHQ 2/9 Scores  PHQ - 2 Score 0 0 0  PHQ- 9 Score 1     Last fall risk screening    01/10/2022    9:46 AM  Fall Risk   Falls in the past year? 0  Number falls in past yr: 0  Injury with Fall? 0  Risk for fall due to : No Fall Risks  Follow up Falls evaluation completed   Last Audit-C alcohol use screening    05/03/2022   10:53 AM  Alcohol Use Disorder Test (AUDIT)  1. How  often do you have a drink containing alcohol? 4  2. How many drinks containing alcohol do you have on a typical day when you are drinking? 0  3. How often do you have six or more drinks on one occasion? 0  AUDIT-C Score 4  4. How often during the last year have you found that you were not able to stop drinking once you had started? 0  5. How often during the last year have you failed to do what was normally expected from you because of drinking? 0  6. How often during the last year have you needed a first drink in the morning to get yourself going after a heavy drinking session? 0  7. How often during the last year have you had a feeling of guilt of remorse after drinking? 0  8. How often during the last year have you been unable to remember what happened the night before because you had been drinking? 0  9. Have you or someone else been injured as a result of your drinking? 0  10. Has a relative or friend or a doctor or another health worker been concerned about your drinking or suggested you cut down? 0  Alcohol Use Disorder Identification Test Final Score (AUDIT) 4   A score of 3 or more in women, and 4 or more in men indicates increased risk for alcohol abuse, EXCEPT if all of the points are from question 1   No results found for any visits on 05/03/22.  Assessment & Plan    Routine Health Maintenance and Physical Exam  Exercise Activities and Dietary recommendations  Goals      DIET - EAT MORE FRUITS AND VEGETABLES     DIET -  INCREASE WATER INTAKE     Recommend to drink at least 6-8 8oz glasses of water per day.         Immunization History  Administered Date(s) Administered   Influenza, High Dose Seasonal PF 10/20/2016, 09/28/2017, 09/03/2018, 09/11/2020   Influenza,inj,Quad PF,6+ Mos 12/01/2015   Influenza-Unspecified 09/28/2017, 09/05/2019   PFIZER(Purple Top)SARS-COV-2 Vaccination 01/27/2020, 02/17/2020, 09/13/2020   Pneumococcal Conjugate-13 11/24/2013   Pneumococcal  Polysaccharide-23 11/25/2014   Td 11/22/2012   Tdap 11/22/2012   Zoster Recombinat (Shingrix) 09/05/2019, 06/11/2020   Zoster, Live 11/22/2012    Health Maintenance  Topic Date Due   INFLUENZA VACCINE  07/04/2022   TETANUS/TDAP  11/22/2022   COLONOSCOPY (Pts 45-67yr Insurance coverage will need to be confirmed)  09/10/2025   Pneumonia Vaccine 76 Years old  Completed   COVID-19 Vaccine  Completed   Hepatitis C Screening  Completed   Zoster Vaccines- Shingrix  Completed   HPV VACCINES  Aged Out    Discussed health benefits of physical activity, and encouraged him to engage in regular exercise appropriate for his age and condition.   2. Prostate cancer screening - PSA Total (Reflex To Free) ([facility] only)  3. Elevated triglycerides with high cholesterol  - Lipid panel - Comprehensive metabolic panel  4. Excessive cerumen in ear canal, bilateral He typically irrigates ear canals at home successfully once or twice a year.      The entirety of the information documented in the History of Present Illness, Review of Systems and Physical Exam were personally obtained by me. Portions of this information were initially documented by the CMA and reviewed by me for thoroughness and accuracy.     DLelon Huh MD  [phone](phone) [phone](fax)  [place]

## 2022-05-04 LAB — LIPID PANEL
Chol/HDL Ratio: 3.5 ratio (ref 0.0–5.0)
Cholesterol, Total: 170 mg/dL (ref 100–199)
HDL: 48 mg/dL (ref >39)
LDL Chol Calc (NIH): 101 mg/dL — ABNORMAL HIGH (ref 0–99)
Triglycerides: 119 mg/dL (ref 0–149)
VLDL Cholesterol Cal: 21 mg/dL (ref 5–40)

## 2022-05-04 LAB — COMPREHENSIVE METABOLIC PANEL
ALT: 19 IU/L (ref 0–44)
AST: 27 IU/L (ref 0–40)
Albumin/Globulin Ratio: 1.8 (ref 1.2–2.2)
Albumin: 4.4 g/dL (ref 3.7–4.7)
Alkaline Phosphatase: 97 IU/L (ref 44–121)
BUN/Creatinine Ratio: 22 (ref 10–24)
BUN: 19 mg/dL (ref 8–27)
Bilirubin Total: 0.4 mg/dL (ref 0.0–1.2)
CO2: 25 mmol/L (ref 20–29)
Calcium: 9.3 mg/dL (ref 8.6–10.2)
Chloride: 97 mmol/L (ref 96–106)
Creatinine, Ser: 0.88 mg/dL (ref 0.76–1.27)
Globulin, Total: 2.4 g/dL (ref 1.5–4.5)
Glucose: 53 mg/dL — ABNORMAL LOW (ref 70–99)
Potassium: 4.2 mmol/L (ref 3.5–5.2)
Sodium: 140 mmol/L (ref 134–144)
Total Protein: 6.8 g/dL (ref 6.0–8.5)
eGFR: 91 mL/min/{1.73_m2} (ref >59)

## 2022-05-04 LAB — PSA TOTAL (REFLEX TO FREE): Prostate Specific Ag, Serum: 1.5 ng/mL (ref 0.0–4.0)

## 2022-06-05 ENCOUNTER — Other Ambulatory Visit: Payer: Self-pay | Admitting: Family Medicine

## 2022-06-05 NOTE — Telephone Encounter (Signed)
Last refill: 12/07/2021 #30 with 5 refills  Last office visit: 05/03/2022 Next office visit: 05/11/2023

## 2022-06-27 ENCOUNTER — Ambulatory Visit: Payer: Medicare HMO | Admitting: Podiatry

## 2022-06-27 DIAGNOSIS — M79674 Pain in right toe(s): Secondary | ICD-10-CM | POA: Diagnosis not present

## 2022-06-27 DIAGNOSIS — B351 Tinea unguium: Secondary | ICD-10-CM

## 2022-06-27 DIAGNOSIS — M79675 Pain in left toe(s): Secondary | ICD-10-CM

## 2022-06-27 NOTE — Progress Notes (Signed)
  Subjective:  Patient ID: Michael Hicks, male    DOB: 05/24/1946,  MRN: [id number]  Chief Complaint  Patient presents with   Nail Problem    Nail trim   76 y.o. male returns for the above complaint.  Patient presents with thickened elongated dystrophic toenails x10.  Painful to touch.  Patient would like for me to debride down.  He is not diabetic.  He denies any other acute complaints.  Does not have any secondary complaints today  Objective:  There were no vitals filed for this visit. Podiatric Exam: Vascular: dorsalis pedis and posterior tibial pulses are palpable bilateral. Capillary return is immediate. Temperature gradient is WNL. Skin turgor WNL  Sensorium: Normal Semmes Weinstein monofilament test. Normal tactile sensation bilaterally. Nail Exam: Pt has thick disfigured discolored nails with subungual debris noted bilateral entire nail hallux through fifth toenails.  Pain on palpation to the nails. Ulcer Exam: There is no evidence of ulcer or pre-ulcerative changes or infection. Orthopedic Exam: Muscle tone and strength are WNL. No limitations in general ROM. No crepitus or effusions noted. HAV  B/L.  Hammer toes 2-5  B/L. Skin: No Porokeratosis. No infection or ulcers.  No further dry skin noted    Assessment & Plan:   No diagnosis found.     Patient was evaluated and treated and all questions answered.  Xerosis skin -Clinically resolving with over-the-counter medications  Onychomycosis with pain  -Nails palliatively debrided as below. -Educated on self-care  Procedure: Nail Debridement Rationale: pain  Type of Debridement: manual, sharp debridement. Instrumentation: Nail nipper, rotary burr. Number of Nails: 10  Procedures and Treatment: Consent by patient was obtained for treatment procedures. The patient understood the discussion of treatment and procedures well. All questions were answered thoroughly reviewed. Debridement of mycotic and hypertrophic  toenails, 1 through 5 bilateral and clearing of subungual debris. No ulceration, no infection noted.  Return Visit-Office Procedure: Patient instructed to return to the office for a follow up visit 3 months for continued evaluation and treatment.  Boneta Lucks, DPM    No follow-ups on file.

## 2022-08-12 DIAGNOSIS — M25562 Pain in left knee: Secondary | ICD-10-CM | POA: Diagnosis not present

## 2022-09-28 ENCOUNTER — Ambulatory Visit: Payer: Medicare HMO | Admitting: Podiatry

## 2022-09-28 DIAGNOSIS — B351 Tinea unguium: Secondary | ICD-10-CM

## 2022-09-28 DIAGNOSIS — M79674 Pain in right toe(s): Secondary | ICD-10-CM

## 2022-09-28 DIAGNOSIS — M79675 Pain in left toe(s): Secondary | ICD-10-CM

## 2022-09-28 NOTE — Progress Notes (Signed)
  Subjective:  Patient ID: Michael Hicks, male    DOB: 1946-08-20,  MRN: [id number]  Chief Complaint  Patient presents with   Nail Problem    Nail trim    76 y.o. male returns for the above complaint.  Patient presents with thickened elongated dystrophic toenails x10.  Painful to touch.  Patient would like for me to debride down.  He is not diabetic.  He denies any other acute complaints.  Does not have any secondary complaints today  Objective:  There were no vitals filed for this visit. Podiatric Exam: Vascular: dorsalis pedis and posterior tibial pulses are palpable bilateral. Capillary return is immediate. Temperature gradient is WNL. Skin turgor WNL  Sensorium: Normal Semmes Weinstein monofilament test. Normal tactile sensation bilaterally. Nail Exam: Pt has thick disfigured discolored nails with subungual debris noted bilateral entire nail hallux through fifth toenails.  Pain on palpation to the nails. Ulcer Exam: There is no evidence of ulcer or pre-ulcerative changes or infection. Orthopedic Exam: Muscle tone and strength are WNL. No limitations in general ROM. No crepitus or effusions noted. HAV  B/L.  Hammer toes 2-5  B/L. Skin: No Porokeratosis. No infection or ulcers.  No further dry skin noted    Assessment & Plan:   1. Pain due to onychomycosis of toenails of both feet        Patient was evaluated and treated and all questions answered.  Xerosis skin -Clinically resolving with over-the-counter medications  Onychomycosis with pain  -Nails palliatively debrided as below. -Educated on self-care  Procedure: Nail Debridement Rationale: pain  Type of Debridement: manual, sharp debridement. Instrumentation: Nail nipper, rotary burr. Number of Nails: 10  Procedures and Treatment: Consent by patient was obtained for treatment procedures. The patient understood the discussion of treatment and procedures well. All questions were answered thoroughly reviewed.  Debridement of mycotic and hypertrophic toenails, 1 through 5 bilateral and clearing of subungual debris. No ulceration, no infection noted.  Return Visit-Office Procedure: Patient instructed to return to the office for a follow up visit 3 months for continued evaluation and treatment.  Boneta Lucks, DPM    No follow-ups on file.

## 2022-10-31 ENCOUNTER — Encounter: Payer: Self-pay | Admitting: Dermatology

## 2022-10-31 ENCOUNTER — Ambulatory Visit: Payer: Medicare HMO | Admitting: Dermatology

## 2022-10-31 VITALS — BP 141/101

## 2022-10-31 DIAGNOSIS — L821 Other seborrheic keratosis: Secondary | ICD-10-CM

## 2022-10-31 DIAGNOSIS — L820 Inflamed seborrheic keratosis: Secondary | ICD-10-CM

## 2022-10-31 DIAGNOSIS — L814 Other melanin hyperpigmentation: Secondary | ICD-10-CM | POA: Diagnosis not present

## 2022-10-31 DIAGNOSIS — D1801 Hemangioma of skin and subcutaneous tissue: Secondary | ICD-10-CM

## 2022-10-31 DIAGNOSIS — L570 Actinic keratosis: Secondary | ICD-10-CM

## 2022-10-31 DIAGNOSIS — L578 Other skin changes due to chronic exposure to nonionizing radiation: Secondary | ICD-10-CM | POA: Diagnosis not present

## 2022-10-31 DIAGNOSIS — L219 Seborrheic dermatitis, unspecified: Secondary | ICD-10-CM | POA: Diagnosis not present

## 2022-10-31 MED ORDER — KETOCONAZOLE 2 % EX CREA: TOPICAL_CREAM | CUTANEOUS | 5 refills | Status: DC

## 2022-10-31 NOTE — Patient Instructions (Addendum)
Cryotherapy Aftercare  Wash gently with soap and water everyday.   Apply Vaseline and Band-Aid daily until healed.    Instructions for [facility] Medications  One or more of your medications was sent to the [facility] mail order compounding pharmacy. You will receive an email from them and can purchase the medicine through that link. It will then be mailed to your home at the address you confirmed. If for any reason you do not receive an email from them, please check your spam folder. If you still do not find the email, please let us know. [facility] phone number is [phone].   - Start 5-fluorouracil/calcipotriene cream twice a day for 5-7 days to affected areas including temples. Prescription sent to [facility]. Patient advised they will receive an email to purchase the medication online and have it sent to their home. Patient provided with handout reviewing treatment course and side effects and advised to call or message [place] on MyChart with any concerns.  5-Fluorouracil/Calcipotriene Patient Education   Actinic keratoses are the dry, red scaly spots on the skin caused by sun damage. A portion of these spots can turn into skin cancer with time, and treating them can help prevent development of skin cancer.   Treatment of these spots requires removal of the defective skin cells. There are various ways to remove actinic keratoses, including freezing with liquid nitrogen, treatment with creams, or treatment with a blue light procedure in the office.   5-fluorouracil cream is a topical cream used to treat actinic keratoses. It works by interfering with the growth of abnormal fast-growing skin cells, such as actinic keratoses. These cells peel off and are replaced by healthy ones.   5-fluorouracil/calcipotriene is a combination of the 5-fluorouracil cream with a vitamin D analog cream called calcipotriene. The calcipotriene alone does not treat actinic  keratoses. However, when it is combined with 5-fluorouracil, it helps the 5-fluorouracil treat the actinic keratoses much faster so that the same results can be achieved with a much shorter treatment time.  INSTRUCTIONS FOR 5-FLUOROURACIL/CALCIPOTRIENE CREAM:   5-fluorouracil/calcipotriene cream typically only needs to be used for 4-7 days. A thin layer should be applied twice a day to the treatment areas recommended by your physician.   If your physician prescribed you separate tubes of 5-fluourouracil and calcipotriene, apply a thin layer of 5-fluorouracil followed by a thin layer of calcipotriene.   Avoid contact with your eyes, nostrils, and mouth. Do not use 5-fluorouracil/calcipotriene cream on infected or open wounds.   You will develop redness, irritation and some crusting at areas where you have pre-cancer damage/actinic keratoses. IF YOU DEVELOP PAIN, BLEEDING, OR SIGNIFICANT CRUSTING, STOP THE TREATMENT EARLY - you have already gotten a good response and the actinic keratoses should clear up well.  Wash your hands after applying 5-fluorouracil 5% cream on your skin.   A moisturizer or sunscreen with a minimum SPF 30 should be applied each morning.   Once you have finished the treatment, you can apply a thin layer of Vaseline twice a day to irritated areas to soothe and calm the areas more quickly. If you experience significant discomfort, contact your physician.  For some patients it is necessary to repeat the treatment for best results.  SIDE EFFECTS: When using 5-fluorouracil/calcipotriene cream, you may have mild irritation, such as redness, dryness, swelling, or a mild burning sensation. This usually resolves within 2 weeks. The more actinic keratoses you have, the more redness and inflammation you can expect  during treatment. Eye irritation has been reported rarely. If this occurs, please let us know.  If you have any trouble using this cream, please call the office. If you have  any other questions about this information, please do not hesitate to ask me before you leave the office.  Reviewed course of treatment and expected reaction.  Patient advised to expect inflammation and crusting and advised that erosions are possible.  Patient advised to be diligent with sun protection during and after treatment. Counseled to keep medication out of reach of children and pets.   Due to recent changes in healthcare laws, you may see results of your pathology and/or laboratory studies on MyChart before the doctors have had a chance to review them. We understand that in some cases there may be results that are confusing or concerning to you. Please understand that not all results are received at the same time and often the doctors may need to interpret multiple results in order to provide you with the best plan of care or course of treatment. Therefore, we ask that you please give [place] 2 business days to thoroughly review all your results before contacting the office for clarification. Should we see a critical lab result, you will be contacted sooner.   If You Need Anything After Your Visit  If you have any questions or concerns for your doctor, please call our main line at [phone] and press option 4 to reach your doctor's medical assistant. If no one answers, please leave a voicemail as directed and we will return your call as soon as possible. Messages left after 4 pm will be answered the following business day.   You may also send [place] a message via [place]. We typically respond to MyChart messages within 1-2 business days.  For prescription refills, please ask your pharmacy to contact our office. Our fax number is [phone].  If you have an urgent issue when the clinic is closed that cannot wait until the next business day, you can page your doctor at the number below.    Please note that while we do our best to be available for urgent issues outside of office hours, we are not  available 24/7.   If you have an urgent issue and are unable to reach [place], you may choose to seek medical care at your doctor's office, retail clinic, urgent care center, or emergency room.  If you have a medical emergency, please immediately call 911 or go to the emergency department.  Pager Numbers  - Dr. Nehemiah Massed: [phone]  - Dr. Laurence Ferrari: [phone]  - Dr. Nicole Kindred: [phone]  In the event of inclement weather, please call our main line at [phone] for an update on the status of any delays or closures.  Dermatology Medication Tips: Please keep the boxes that topical medications come in in order to help keep track of the instructions about where and how to use these. Pharmacies typically print the medication instructions only on the boxes and not directly on the medication tubes.   If your medication is too expensive, please contact our office at [phone] option 4 or send [place] a message through [place].   We are unable to tell what your co-pay for medications will be in advance as this is different depending on your insurance coverage. However, we may be able to find a substitute medication at lower cost or fill out paperwork to get insurance to cover a needed medication.   If a prior authorization is required to  get your medication covered by your insurance company, please allow [place] 1-2 business days to complete this process.  Drug prices often vary depending on where the prescription is filled and some pharmacies may offer cheaper prices.  The website www.goodrx.com contains coupons for medications through different pharmacies. The prices here do not account for what the cost may be with help from insurance (it may be cheaper with your insurance), but the website can give you the price if you did not use any insurance.  - You can print the associated coupon and take it with your prescription to the pharmacy.  - You may also stop by our office during regular business hours  and pick up a GoodRx coupon card.  - If you need your prescription sent electronically to a different pharmacy, notify our office through [facility] or by phone at [phone] option 4.     Si Usted Necesita Algo Despus de Su Visita  Tambin puede enviarnos un mensaje a travs de Pharmacist, community. Por lo general respondemos a los mensajes de MyChart en el transcurso de 1 a 2 das hbiles.  Para renovar recetas, por favor pida a su farmacia que se ponga en contacto con nuestra oficina. Harland Dingwall de fax es [place] [phone].  Si tiene un asunto urgente cuando la clnica est cerrada y que no puede esperar hasta el siguiente da hbil, puede llamar/localizar a su doctor(a) al nmero que aparece a continuacin.   Por favor, tenga en cuenta que aunque hacemos todo lo posible para estar disponibles para asuntos urgentes fuera del horario de [place], no estamos disponibles las 24 horas del da, los 7 das de la [place].   Si tiene un problema urgente y no puede comunicarse con nosotros, puede optar por buscar atencin mdica  en el consultorio de su doctor(a), en una clnica privada, en un centro de atencin urgente o en una sala de emergencias.  Si tiene Engineering geologist, por favor llame inmediatamente al 911 o vaya a la sala de emergencias.  Nmeros de bper  - Dr. Nehemiah Massed: [phone]  - Dra. Moye: [phone]  - Dra. Nicole Kindred: [phone]  En caso de inclemencias del [place], por favor llame a Johnsie Kindred principal al [phone] para una actualizacin sobre el [place] de cualquier retraso o cierre.  Consejos para la medicacin en dermatologa: Por favor, guarde las cajas en las que vienen los medicamentos de uso tpico para ayudarle a seguir las instrucciones sobre dnde y cmo usarlos. Las farmacias generalmente imprimen las instrucciones del medicamento slo en las cajas y no directamente en los tubos del [place].   Si su medicamento es muy caro, por favor, pngase  en contacto con Zigmund Daniel llamando al [phone] y presione la opcin 4 o envenos un mensaje a travs de Pharmacist, community.   No podemos decirle cul ser su copago por los medicamentos por adelantado ya que esto es diferente dependiendo de la cobertura de su seguro. Sin embargo, es posible que podamos encontrar un medicamento sustituto a Electrical engineer un formulario para que el seguro cubra el medicamento que se considera necesario.   Si se requiere una autorizacin previa para que su compaa de seguros [place] su medicamento, por favor permtanos de 1 a 2 das hbiles para completar este proceso.  Los precios de los medicamentos varan con frecuencia dependiendo del Environmental consultant de dnde se surte la receta y alguna farmacias pueden ofrecer precios ms baratos.  El sitio web www.goodrx.com tiene cupones para medicamentos de Airline pilot.  en cuenta lo que podra costar con la ayuda del seguro (puede ser ms barato con su seguro), pero el sitio web puede darle el precio si no utiliz ningn seguro.  - Puede imprimir el cupn correspondiente y llevarlo con su receta a la farmacia.  - Tambin puede pasar por nuestra oficina durante el horario de atencin regular y recoger una tarjeta de cupones de GoodRx.  - Si necesita que su receta se enve electrnicamente a una farmacia diferente, informe a nuestra oficina a travs de MyChart de [place] o por telfono llamando al [phone] y presione la opcin 4.  

## 2022-10-31 NOTE — Progress Notes (Signed)
Follow-Up Visit   Subjective  Michael Hicks is a 76 y.o. male who presents for the following: Annual Exam.  The patient presents for Upper Body Skin Exam (UBSE) for skin cancer screening and mole check.  The patient has spots, moles and lesions to be evaluated, some may be new or changing. He has a scaly spot on his right ear.  He has seborrheic dermatitis of the face, improved with ketoconazole 2% cream. He has a few spots that are irritated and itchy that he picks at.   The following portions of the chart were reviewed this encounter and updated as appropriate:       Review of Systems:  No other skin or systemic complaints except as noted in HPI or Assessment and Plan.  Objective  Well appearing patient in no apparent distress; mood and affect are within normal limits.  All skin waist up examined.  face Pink patches with greasy scale.   R antihelix x 1 (residual), R upper ear helix x 1 (2) Pink scaly macules.  Right Preauricular x 1; Left Neck x 1; Left Clavicle x 1 (3) Erythematous stuck-on, waxy papule    Assessment & Plan  Skin cancer screening performed today.  Actinic Damage with PreCancerous Actinic Keratoses Counseling for Topical Chemotherapy Management: Patient exhibits: - Severe, confluent actinic changes with pre-cancerous actinic keratoses that is secondary to cumulative UV radiation exposure over time - Condition that is severe; chronic, not at goal. - diffuse scaly erythematous macules and papules with underlying dyspigmentation - Discussed Prescription "Field Treatment" topical Chemotherapy for Severe, Chronic Confluent Actinic Changes with Pre-Cancerous Actinic Keratoses Field treatment involves treatment of an entire area of skin that has confluent Actinic Changes (Sun/ Ultraviolet light damage) and PreCancerous Actinic Keratoses by method of PhotoDynamic Therapy (PDT) and/or prescription Topical Chemotherapy agents such as 5-fluorouracil,  5-fluorouracil/calcipotriene, and/or imiquimod.  The purpose is to decrease the number of clinically evident and subclinical PreCancerous lesions to prevent progression to development of skin cancer by chemically destroying early precancer changes that may or may not be visible.  It has been shown to reduce the risk of developing skin cancer in the treated area. As a result of treatment, redness, scaling, crusting, and open sores may occur during treatment course. One or more than one of these methods may be used and may have to be used several times to control, suppress and eliminate the PreCancerous changes. Discussed treatment course, expected reaction, and possible side effects. - Recommend daily broad spectrum sunscreen SPF 30+ to sun-exposed areas, reapply every 2 hours as needed.  - Staying in the shade or wearing long sleeves, sun glasses (UVA+UVB protection) and wide brim hats (4-inch brim around the entire circumference of the hat) are also recommended. - Call for new or changing lesions. - Start 5-fluorouracil/calcipotriene cream twice a day for 5-7 days to affected areas including temples. Prescription sent to [facility]. Patient advised they will receive an email to purchase the medication online and have it sent to their home. Patient provided with handout reviewing treatment course and side effects and advised to call or message [place] on MyChart with any concerns.  Reviewed course of treatment and expected reaction.  Patient advised to expect inflammation and crusting and advised that erosions are possible.  Patient advised to be diligent with sun protection during and after treatment. Counseled to keep medication out of reach of children and pets.   Lentigines - Scattered tan macules - Due to sun exposure -  Benign-appearing, observe - Recommend daily broad spectrum sunscreen SPF 30+ to sun-exposed areas, reapply every 2 hours as needed. - Call for any  changes  Seborrheic Keratoses - Stuck-on, waxy, tan-brown papules and/or plaques  - Benign-appearing - Discussed benign etiology and prognosis. - Observe - Call for any changes  Hemangiomas - Red papules - Discussed benign nature - Observe - Call for any changes  Seborrheic dermatitis face  Chronic condition with duration or expected duration over one year. Currently well-controlled.   Seborrheic Dermatitis  -  is a chronic persistent rash characterized by pinkness and scaling most commonly of the mid face but also can occur on the scalp (dandruff), ears; mid chest, mid back and groin.  It tends to be exacerbated by stress and cooler weather.  People who have neurologic disease may experience new onset or exacerbation of existing seborrheic dermatitis.  The condition is not curable but treatable and can be controlled.  Continue ketoconazole 2% cream Apply to AA face QD/BID dsp 60g 5Rf.  Related Medications ketoconazole (NIZORAL) 2 % cream Apply to the forehead, temples, and nose QD/BID.  AK (actinic keratosis) (2) R antihelix x 1 (residual), R upper ear helix x 1  Actinic keratoses are precancerous spots that appear secondary to cumulative UV radiation exposure/sun exposure over time. They are chronic with expected duration over 1 year. A portion of actinic keratoses will progress to squamous cell carcinoma of the skin. It is not possible to reliably predict which spots will progress to skin cancer and so treatment is recommended to prevent development of skin cancer.  Recommend daily broad spectrum sunscreen SPF 30+ to sun-exposed areas, reapply every 2 hours as needed.  Recommend staying in the shade or wearing long sleeves, sun glasses (UVA+UVB protection) and wide brim hats (4-inch brim around the entire circumference of the hat). Call for new or changing lesions.    Destruction of lesion - R antihelix x 1 (residual), R upper ear helix x 1  Destruction method:  cryotherapy   Informed consent: discussed and consent obtained   Lesion destroyed using liquid nitrogen: Yes   Region frozen until ice ball extended beyond lesion: Yes   Outcome: patient tolerated procedure well with no complications   Post-procedure details: wound care instructions given   Additional details:  Prior to procedure, discussed risks of blister formation, small wound, skin dyspigmentation, or rare scar following cryotherapy. Recommend Vaseline ointment to treated areas while healing.   Inflamed seborrheic keratosis (3) Right Preauricular x 1; Left Neck x 1; Left Clavicle x 1  Symptomatic, irritating, patient would like treated.  Destruction of lesion - Right Preauricular x 1; Left Neck x 1; Left Clavicle x 1  Destruction method: cryotherapy   Informed consent: discussed and consent obtained   Lesion destroyed using liquid nitrogen: Yes   Region frozen until ice ball extended beyond lesion: Yes   Outcome: patient tolerated procedure well with no complications   Post-procedure details: wound care instructions given   Additional details:  Prior to procedure, discussed risks of blister formation, small wound, skin dyspigmentation, or rare scar following cryotherapy. Recommend Vaseline ointment to treated areas while healing.    Return in about 3 months (around 01/31/2023) for AKs, also 1 YR UBSE.  IJamesetta Orleans, CMA, am acting as scribe for Brendolyn Patty, MD .  Documentation: I have reviewed the above documentation for accuracy and completeness, and I agree with the above.  Brendolyn Patty MD

## 2022-12-11 ENCOUNTER — Telehealth: Payer: Self-pay | Admitting: Family Medicine

## 2022-12-11 MED ORDER — COLCHICINE 0.6 MG PO CAPS: 1.0000 | ORAL_CAPSULE | Freq: Every morning | ORAL | 5 refills | Status: DC

## 2022-12-11 NOTE — Telephone Encounter (Signed)
[facility] faxed refill request for the following medications:    MITIGARE 0.6 MG CAPS   Please advise

## 2023-01-02 ENCOUNTER — Encounter: Payer: Self-pay | Admitting: Podiatry

## 2023-01-02 ENCOUNTER — Ambulatory Visit: Payer: Medicare HMO | Admitting: Podiatry

## 2023-01-02 DIAGNOSIS — M79675 Pain in left toe(s): Secondary | ICD-10-CM | POA: Diagnosis not present

## 2023-01-02 DIAGNOSIS — B351 Tinea unguium: Secondary | ICD-10-CM

## 2023-01-02 DIAGNOSIS — M79674 Pain in right toe(s): Secondary | ICD-10-CM

## 2023-01-02 NOTE — Progress Notes (Signed)
  Subjective:  Patient ID: Michael Hicks, male    DOB: 04/03/1946,  MRN: [id number]  Chief Complaint  Patient presents with   Nail Problem    Nail trim    77 y.o. male returns for the above complaint.  Patient presents with thickened elongated dystrophic toenails x10.  Painful to touch.  Patient would like for me to debride down.  He is not diabetic.  He denies any other acute complaints.  Does not have any secondary complaints today  Objective:  There were no vitals filed for this visit. Podiatric Exam: Vascular: dorsalis pedis and posterior tibial pulses are palpable bilateral. Capillary return is immediate. Temperature gradient is WNL. Skin turgor WNL  Sensorium: Normal Semmes Weinstein monofilament test. Normal tactile sensation bilaterally. Nail Exam: Pt has thick disfigured discolored nails with subungual debris noted bilateral entire nail hallux through fifth toenails.  Pain on palpation to the nails. Ulcer Exam: There is no evidence of ulcer or pre-ulcerative changes or infection. Orthopedic Exam: Muscle tone and strength are WNL. No limitations in general ROM. No crepitus or effusions noted. HAV  B/L.  Hammer toes 2-5  B/L. Skin: No Porokeratosis. No infection or ulcers.  No further dry skin noted    Assessment & Plan:   1. Pain due to onychomycosis of toenails of both feet         Patient was evaluated and treated and all questions answered.  Xerosis skin -Clinically resolving with over-the-counter medications  Onychomycosis with pain  -Nails palliatively debrided as below. -Educated on self-care  Procedure: Nail Debridement Rationale: pain  Type of Debridement: manual, sharp debridement. Instrumentation: Nail nipper, rotary burr. Number of Nails: 10  Procedures and Treatment: Consent by patient was obtained for treatment procedures. The patient understood the discussion of treatment and procedures well. All questions were answered thoroughly reviewed.  Debridement of mycotic and hypertrophic toenails, 1 through 5 bilateral and clearing of subungual debris. No ulceration, no infection noted.  Return Visit-Office Procedure: Patient instructed to return to the office for a follow up visit 3 months for continued evaluation and treatment.  Boneta Lucks, DPM    No follow-ups on file.

## 2023-01-09 ENCOUNTER — Telehealth: Payer: Self-pay

## 2023-01-09 MED ORDER — HYDROCORTISONE 2.5 % EX OINT: TOPICAL_OINTMENT | Freq: Two times a day (BID) | CUTANEOUS | 0 refills | Status: DC

## 2023-01-09 NOTE — Telephone Encounter (Signed)
Patient has finished using 5FU/Calcipotriene Cream. Patient is now swollen around the eyes and irritated.  Patient is asking what can he use to help calm down the flare and irritation around face.

## 2023-01-09 NOTE — Telephone Encounter (Signed)
RX sent in and patient advised. aw

## 2023-02-06 ENCOUNTER — Ambulatory Visit: Payer: Medicare HMO | Admitting: Dermatology

## 2023-02-06 VITALS — BP 127/77 | HR 70

## 2023-02-06 DIAGNOSIS — L820 Inflamed seborrheic keratosis: Secondary | ICD-10-CM | POA: Diagnosis not present

## 2023-02-06 DIAGNOSIS — L570 Actinic keratosis: Secondary | ICD-10-CM | POA: Diagnosis not present

## 2023-02-06 DIAGNOSIS — D229 Melanocytic nevi, unspecified: Secondary | ICD-10-CM

## 2023-02-06 DIAGNOSIS — D224 Melanocytic nevi of scalp and neck: Secondary | ICD-10-CM

## 2023-02-06 DIAGNOSIS — L578 Other skin changes due to chronic exposure to nonionizing radiation: Secondary | ICD-10-CM | POA: Diagnosis not present

## 2023-02-06 NOTE — Progress Notes (Signed)
Follow-Up Visit   Subjective  Michael Hicks is a 77 y.o. male who presents for the following: Follow-up.  Patient presents for 3 month follow-up Aks of the face. He used 5FU/Calcipotriene cream to the forehead, temples, cheeks x 6 days with a good reaction. Recheck right ear, possible residual AK.  The following portions of the chart were reviewed this encounter and updated as appropriate:       Review of Systems:  No other skin or systemic complaints except as noted in HPI or Assessment and Plan.  Objective  Well appearing patient in no apparent distress; mood and affect are within normal limits.  A focused examination was performed including face, scalp. Relevant physical exam findings are noted in the Assessment and Plan.  L upper nasal dorsum x 1, R med cheek x 1 (2) pink scaly macule.  Left Posterior Neck Erythematous stuck-on, waxy papule   Left Posterior Neck 2.5 mm brown macule  Right antihelix Pink scaly papule, residual.    Assessment & Plan  Actinic Damage - chronic, secondary to cumulative UV radiation exposure/sun exposure over time - diffuse scaly erythematous macules with underlying dyspigmentation - Recommend daily broad spectrum sunscreen SPF 30+ to sun-exposed areas, reapply every 2 hours as needed.  - Recommend staying in the shade or wearing long sleeves, sun glasses (UVA+UVB protection) and wide brim hats (4-inch brim around the entire circumference of the hat). - Call for new or changing lesions.  AK (actinic keratosis) (2) L upper nasal dorsum x 1, R med cheek x 1  Actinic keratoses are precancerous spots that appear secondary to cumulative UV radiation exposure/sun exposure over time. They are chronic with expected duration over 1 year. A portion of actinic keratoses will progress to squamous cell carcinoma of the skin. It is not possible to reliably predict which spots will progress to skin cancer and so treatment is recommended to prevent  development of skin cancer.  Recommend daily broad spectrum sunscreen SPF 30+ to sun-exposed areas, reapply every 2 hours as needed.  Recommend staying in the shade or wearing long sleeves, sun glasses (UVA+UVB protection) and wide brim hats (4-inch brim around the entire circumference of the hat). Call for new or changing lesions.  Destruction of lesion - L upper nasal dorsum x 1, R med cheek x 1  Destruction method: cryotherapy   Informed consent: discussed and consent obtained   Lesion destroyed using liquid nitrogen: Yes   Region frozen until ice ball extended beyond lesion: Yes   Outcome: patient tolerated procedure well with no complications   Post-procedure details: wound care instructions given   Additional details:  Prior to procedure, discussed risks of blister formation, small wound, skin dyspigmentation, or rare scar following cryotherapy. Recommend Vaseline ointment to treated areas while healing.   Inflamed seborrheic keratosis Left Posterior Neck  vs Wart  Symptomatic, irritating, patient would like treated.  Destruction of lesion - Left Posterior Neck  Destruction method: cryotherapy   Informed consent: discussed and consent obtained   Lesion destroyed using liquid nitrogen: Yes   Region frozen until ice ball extended beyond lesion: Yes   Outcome: patient tolerated procedure well with no complications   Post-procedure details: wound care instructions given   Additional details:  Prior to procedure, discussed risks of blister formation, small wound, skin dyspigmentation, or rare scar following cryotherapy. Recommend Vaseline ointment to treated areas while healing.   Nevus Left Posterior Neck  Benign-appearing.  Observation.  Call clinic for new or changing  moles.  Recommend daily use of broad spectrum spf 30+ sunscreen to sun-exposed areas.   Hypertrophic actinic keratosis Right antihelix  vs ISK.   If still present after healed from 2nd treatment, start  5FU/Calcipotriene cream BID x 1 week.  Actinic keratoses are precancerous spots that appear secondary to cumulative UV radiation exposure/sun exposure over time. They are chronic with expected duration over 1 year. A portion of actinic keratoses will progress to squamous cell carcinoma of the skin. It is not possible to reliably predict which spots will progress to skin cancer and so treatment is recommended to prevent development of skin cancer.  Recommend daily broad spectrum sunscreen SPF 30+ to sun-exposed areas, reapply every 2 hours as needed.  Recommend staying in the shade or wearing long sleeves, sun glasses (UVA+UVB protection) and wide brim hats (4-inch brim around the entire circumference of the hat). Call for new or changing lesions.  Destruction of lesion - Right antihelix  Destruction method: cryotherapy   Informed consent: discussed and consent obtained   Lesion destroyed using liquid nitrogen: Yes   Region frozen until ice ball extended beyond lesion: Yes   Outcome: patient tolerated procedure well with no complications   Post-procedure details: wound care instructions given   Additional details:  Prior to procedure, discussed risks of blister formation, small wound, skin dyspigmentation, or rare scar following cryotherapy. Recommend Vaseline ointment to treated areas while healing.    Return as scheduled, for UBSE, Hx AKs.  IJamesetta Orleans, CMA, am acting as scribe for Brendolyn Patty, MD .  Documentation: I have reviewed the above documentation for accuracy and completeness, and I agree with the above.  Brendolyn Patty MD

## 2023-02-06 NOTE — Patient Instructions (Addendum)
If right ear still scaly after healed from treatment, start 5-fluorouracil/calcipotriene cream twice a day x 1 week.     Recommend OTC [place] Rapid Relief Anti-Itch cream (pramoxine + menthol), CeraVe Anti-itch cream or lotion (pramoxine), Sarna lotion (Original- menthol + camphor or Sensitive- pramoxine) or Eucerin 12 hour Itch Relief lotion (menthol) up to 3 times per day to areas on body that are itchy.   Cryotherapy Aftercare  Wash gently with soap and water everyday.   Apply Vaseline and Band-Aid daily until healed.    Due to recent changes in healthcare laws, you may see results of your pathology and/or laboratory studies on MyChart before the doctors have had a chance to review them. We understand that in some cases there may be results that are confusing or concerning to you. Please understand that not all results are received at the same time and often the doctors may need to interpret multiple results in order to provide you with the best plan of care or course of treatment. Therefore, we ask that you please give [place] 2 business days to thoroughly review all your results before contacting the office for clarification. Should we see a critical lab result, you will be contacted sooner.   If You Need Anything After Your Visit  If you have any questions or concerns for your doctor, please call our main line at [phone] and press option 4 to reach your doctor's medical assistant. If no one answers, please leave a voicemail as directed and we will return your call as soon as possible. Messages left after 4 pm will be answered the following business day.   You may also send [place] a message via [place]. We typically respond to MyChart messages within 1-2 business days.  For prescription refills, please ask your pharmacy to contact our office. Our fax number is [phone].  If you have an urgent issue when the clinic is closed that cannot wait until the next business day, you can page  your doctor at the number below.    Please note that while we do our best to be available for urgent issues outside of office hours, we are not available 24/7.   If you have an urgent issue and are unable to reach [place], you may choose to seek medical care at your doctor's office, retail clinic, urgent care center, or emergency room.  If you have a medical emergency, please immediately call 911 or go to the emergency department.  Pager Numbers  - Dr. Nehemiah Massed: [phone]  - Dr. Laurence Ferrari: [phone]  - Dr. Nicole Kindred: [phone]  In the event of inclement weather, please call our main line at [phone] for an update on the status of any delays or closures.  Dermatology Medication Tips: Please keep the boxes that topical medications come in in order to help keep track of the instructions about where and how to use these. Pharmacies typically print the medication instructions only on the boxes and not directly on the medication tubes.   If your medication is too expensive, please contact our office at [phone] option 4 or send [place] a message through [place].   We are unable to tell what your co-pay for medications will be in advance as this is different depending on your insurance coverage. However, we may be able to find a substitute medication at lower cost or fill out paperwork to get insurance to cover a needed medication.   If a prior authorization is required to get your medication covered by  your insurance company, please allow [place] 1-2 business days to complete this process.  Drug prices often vary depending on where the prescription is filled and some pharmacies may offer cheaper prices.  The website www.goodrx.com contains coupons for medications through different pharmacies. The prices here do not account for what the cost may be with help from insurance (it may be cheaper with your insurance), but the website can give you the price if you did not use any insurance.  - You can  print the associated coupon and take it with your prescription to the pharmacy.  - You may also stop by our office during regular business hours and pick up a GoodRx coupon card.  - If you need your prescription sent electronically to a different pharmacy, notify our office through [facility] or by phone at [phone] option 4.     Si Usted Necesita Algo Despus de Su Visita  Tambin puede enviarnos un mensaje a travs de Pharmacist, community. Por lo general respondemos a los mensajes de MyChart en el transcurso de 1 a 2 das hbiles.  Para renovar recetas, por favor pida a su farmacia que se ponga en contacto con nuestra oficina. Harland Dingwall de fax es [place] [phone].  Si tiene un asunto urgente cuando la clnica est cerrada y que no puede esperar hasta el siguiente da hbil, puede llamar/localizar a su doctor(a) al nmero que aparece a continuacin.   Por favor, tenga en cuenta que aunque hacemos todo lo posible para estar disponibles para asuntos urgentes fuera del horario de [employer], no estamos disponibles las 24 horas del da, los 7 das de la [place].   Si tiene un problema urgente y no puede comunicarse con nosotros, puede optar por buscar atencin mdica  en el consultorio de su doctor(a), en una clnica privada, en un centro de atencin urgente o en una sala de emergencias.  Si tiene Engineering geologist, por favor llame inmediatamente al 911 o vaya a la sala de emergencias.  Nmeros de bper  - Dr. Nehemiah Massed: [phone]  - Dra. Moye: [phone]  - Dra. Nicole Kindred: [phone]  En caso de inclemencias del [place], por favor llame a Johnsie Kindred principal al [phone] para una actualizacin sobre el [place] de cualquier retraso o cierre.  Consejos para la medicacin en dermatologa: Por favor, guarde las cajas en las que vienen los medicamentos de uso tpico para ayudarle a seguir las instrucciones sobre dnde y cmo usarlos. Las farmacias generalmente imprimen las  instrucciones del medicamento slo en las cajas y no directamente en los tubos del [place].   Si su medicamento es muy caro, por favor, pngase en contacto con Zigmund Daniel llamando al [phone] y presione la opcin 4 o envenos un mensaje a travs de Pharmacist, community.   No podemos decirle cul ser su copago por los medicamentos por adelantado ya que esto es diferente dependiendo de la cobertura de su seguro. Sin embargo, es posible que podamos encontrar un medicamento sustituto a Electrical engineer un formulario para que el seguro cubra el medicamento que se considera necesario.   Si se requiere una autorizacin previa para que su compaa de seguros [place] su medicamento, por favor permtanos de 1 a 2 das hbiles para completar este proceso.  Los precios de los medicamentos varan con frecuencia dependiendo del Environmental consultant de dnde se surte la receta y alguna farmacias pueden ofrecer precios ms baratos.  El sitio web www.goodrx.com tiene cupones para medicamentos de Airline pilot. Los precios aqu no tienen  en cuenta lo que podra costar con la ayuda del seguro (puede ser ms barato con su seguro), pero el sitio web puede darle el precio si no Field seismologist.  - Puede imprimir el cupn correspondiente y llevarlo con su receta a la farmacia.  - Tambin puede pasar por nuestra oficina durante el horario de atencin regular y Charity fundraiser una tarjeta de cupones de GoodRx.  - Si necesita que su receta se enve electrnicamente a una farmacia diferente, informe a nuestra oficina a travs de MyChart de [place] o por telfono llamando al [phone] y presione la opcin 4.

## 2023-02-22 ENCOUNTER — Telehealth: Payer: Self-pay | Admitting: Family Medicine

## 2023-02-22 NOTE — Telephone Encounter (Signed)
Called patient to schedule Medicare Annual Wellness Visit (AWV). Left message for patient to call back and schedule Medicare Annual Wellness Visit (AWV).  Last date of AWV: 01/17/2022  Please schedule an appointment at any time with [facility] .  If any questions, please contact me at [phone].  Thank you ,  [place] Direct Dial: [phone]

## 2023-02-26 DIAGNOSIS — H5213 Myopia, bilateral: Secondary | ICD-10-CM | POA: Diagnosis not present

## 2023-03-14 IMAGING — DX DG FOOT COMPLETE 3+V*L*
3 series · 3 of 3 positions shown · non-contrast
Comparison: None.

CLINICAL DATA: 74-year-old male with left ankle swelling.

EXAM:
LEFT FOOT - COMPLETE 3+ VIEW

[foot ap]
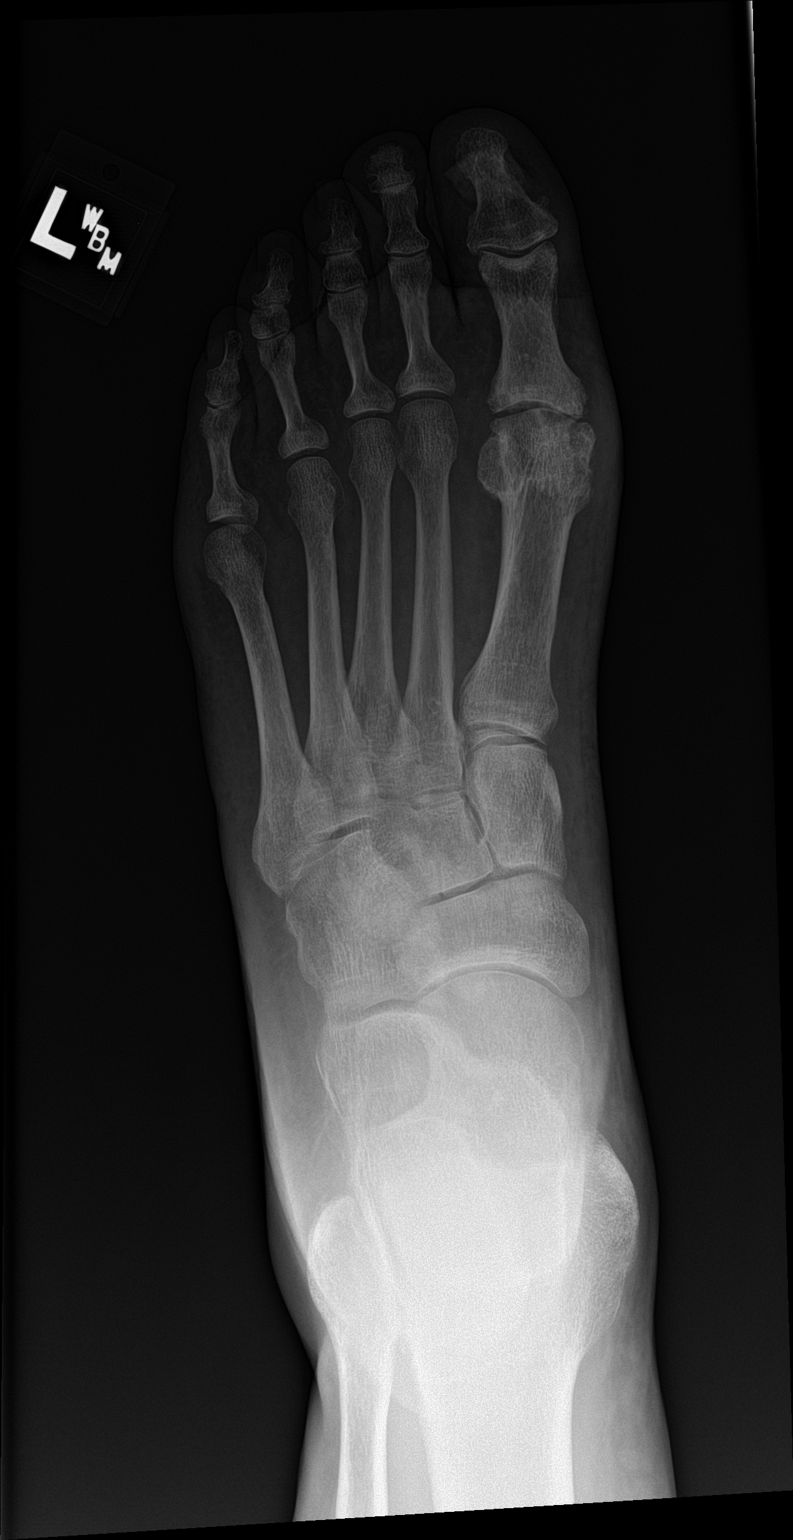

[foot obl]
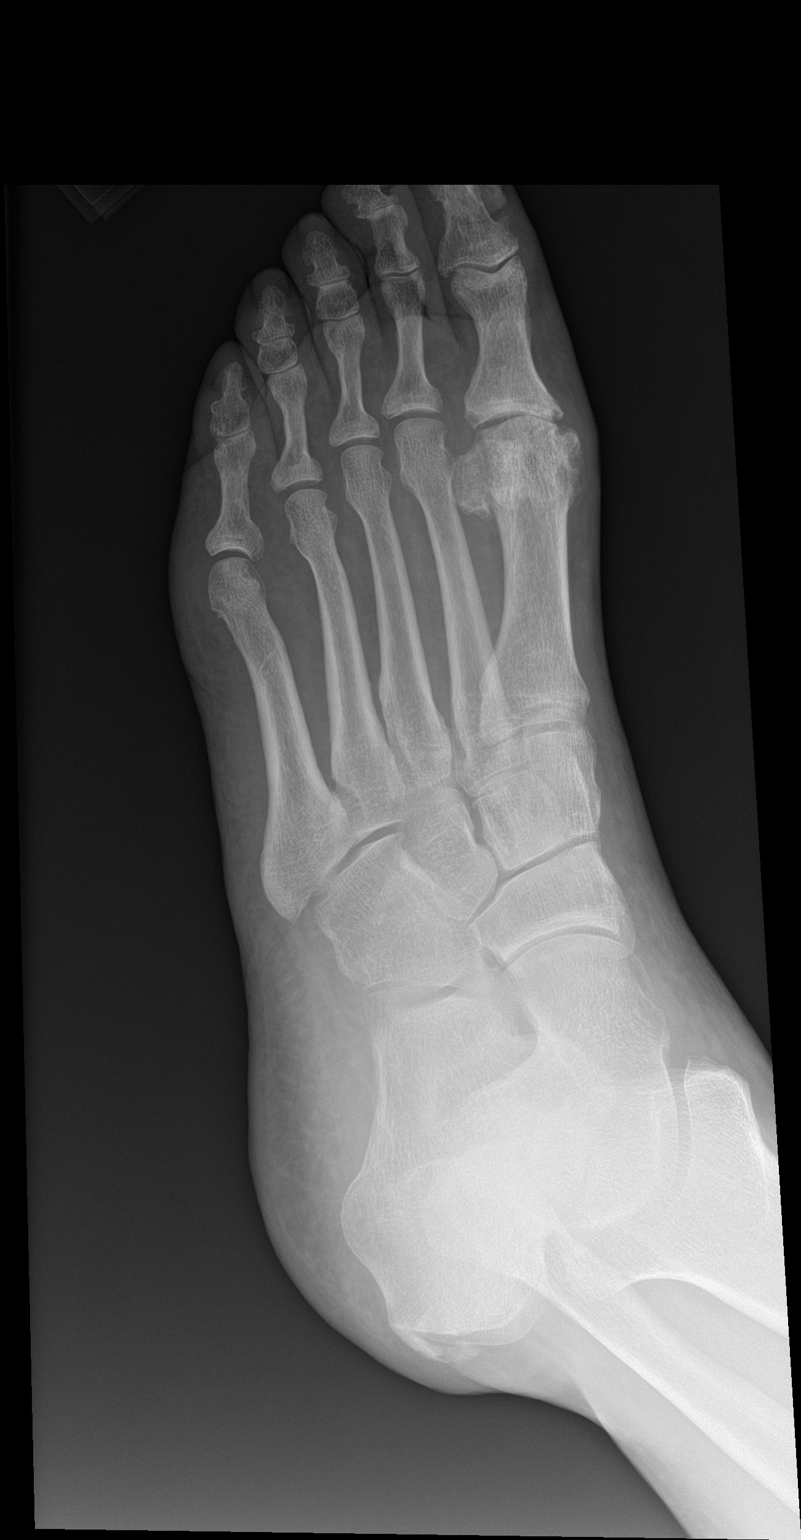

[foot lat]
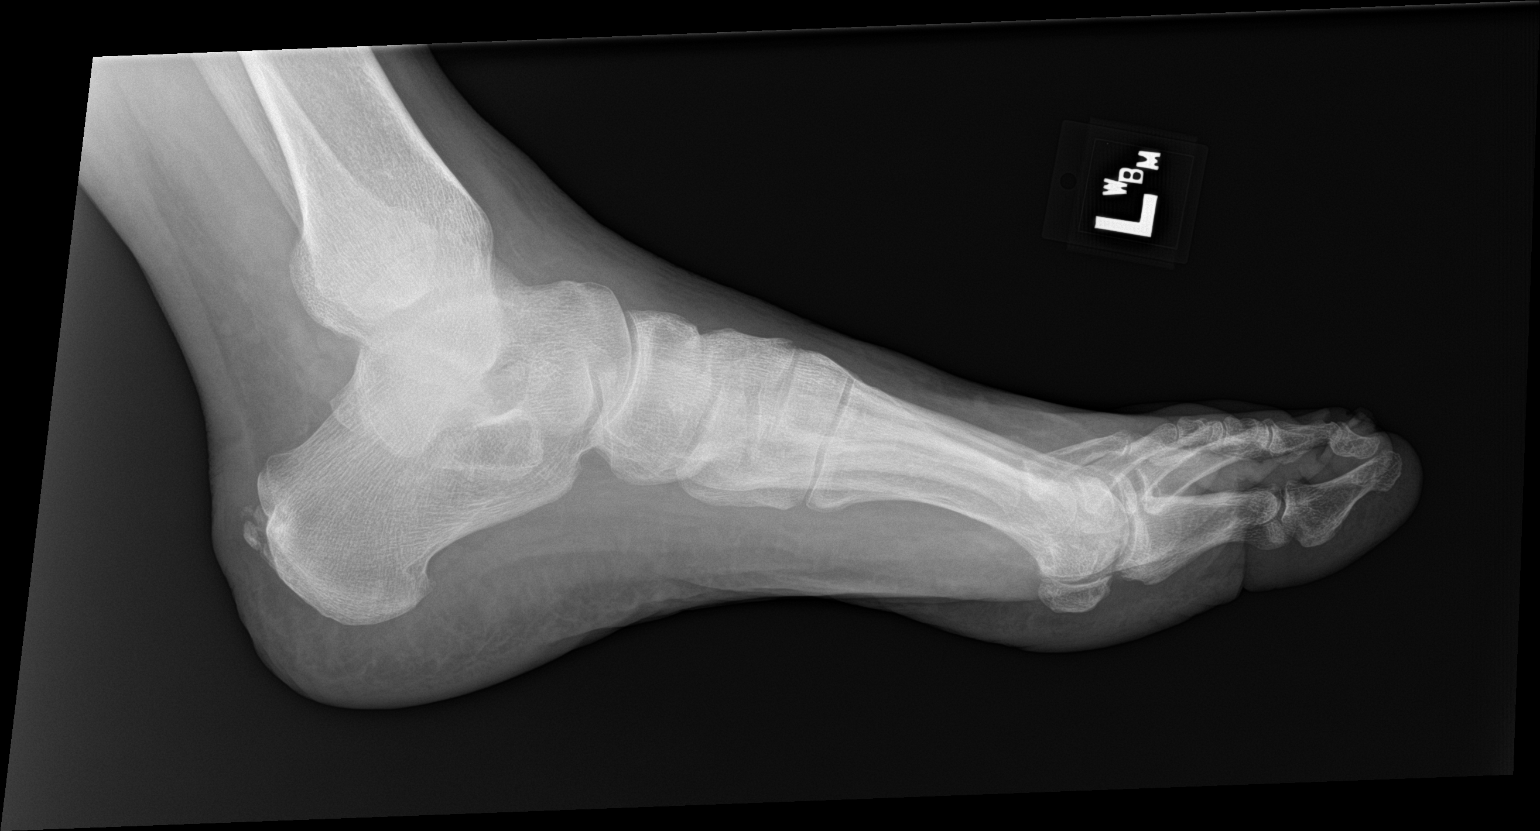

[3 of 3 positions shown; findings below may reference images not displayed]

FINDINGS: There is no acute fracture or dislocation. There is moderate
arthritic changes of the first MTP joint with spurring. The soft
tissues are unremarkable.
IMPRESSION: No acute fracture or dislocation.

## 2023-03-29 ENCOUNTER — Ambulatory Visit: Payer: Medicare HMO | Admitting: Podiatry

## 2023-03-30 ENCOUNTER — Ambulatory Visit: Payer: Medicare HMO | Admitting: Podiatry

## 2023-03-30 DIAGNOSIS — B351 Tinea unguium: Secondary | ICD-10-CM

## 2023-03-30 DIAGNOSIS — M79675 Pain in left toe(s): Secondary | ICD-10-CM | POA: Diagnosis not present

## 2023-03-30 DIAGNOSIS — M79674 Pain in right toe(s): Secondary | ICD-10-CM

## 2023-03-30 NOTE — Progress Notes (Signed)
  Subjective:  Patient ID: Michael Hicks, male    DOB: 08/06/1946,  MRN: [id number]  Chief Complaint  Patient presents with   Nail Problem    Nail trim    76 y.o. male returns for the above complaint.  Patient presents with thickened elongated dystrophic toenails x10.  Painful to touch.  Patient would like for me to debride down.  He is not diabetic.  He denies any other acute complaints.  Does not have any secondary complaints today  Objective:  There were no vitals filed for this visit. Podiatric Exam: Vascular: dorsalis pedis and posterior tibial pulses are palpable bilateral. Capillary return is immediate. Temperature gradient is WNL. Skin turgor WNL  Sensorium: Normal Semmes Weinstein monofilament test. Normal tactile sensation bilaterally. Nail Exam: Pt has thick disfigured discolored nails with subungual debris noted bilateral entire nail hallux through fifth toenails.  Pain on palpation to the nails. Ulcer Exam: There is no evidence of ulcer or pre-ulcerative changes or infection. Orthopedic Exam: Muscle tone and strength are WNL. No limitations in general ROM. No crepitus or effusions noted. HAV  B/L.  Hammer toes 2-5  B/L. Skin: No Porokeratosis. No infection or ulcers.  No further dry skin noted    Assessment & Plan:   1. Pain due to onychomycosis of toenails of both feet         Patient was evaluated and treated and all questions answered.  Xerosis skin -Clinically resolving with over-the-counter medications  Onychomycosis with pain  -Nails palliatively debrided as below. -Educated on self-care  Procedure: Nail Debridement Rationale: pain  Type of Debridement: manual, sharp debridement. Instrumentation: Nail nipper, rotary burr. Number of Nails: 10  Procedures and Treatment: Consent by patient was obtained for treatment procedures. The patient understood the discussion of treatment and procedures well. All questions were answered thoroughly reviewed.  Debridement of mycotic and hypertrophic toenails, 1 through 5 bilateral and clearing of subungual debris. No ulceration, no infection noted.  Return Visit-Office Procedure: Patient instructed to return to the office for a follow up visit 3 months for continued evaluation and treatment.  Kynzee Devinney, DPM    No follow-ups on file. 

## 2023-05-01 ENCOUNTER — Ambulatory Visit (INDEPENDENT_AMBULATORY_CARE_PROVIDER_SITE_OTHER): Payer: Medicare HMO

## 2023-05-01 VITALS — Ht 72.0 in | Wt 193.0 lb

## 2023-05-01 DIAGNOSIS — Z0000 Encounter for general adult medical examination without abnormal findings: Secondary | ICD-10-CM | POA: Diagnosis not present

## 2023-05-01 NOTE — Progress Notes (Signed)
I connected with  Michael Hicks on 05/01/23 by a audio enabled telemedicine application and verified that I am speaking with the correct person using two identifiers.  Patient Location: Home  Provider Location: Office/Clinic  I discussed the limitations of evaluation and management by telemedicine. The patient expressed understanding and agreed to proceed.  Subjective:   Michael Hicks is a 77 y.o. male who presents for Medicare Annual/Subsequent preventive examination.  Review of Systems    Cardiac Risk Factors include: advanced age (>58men, >45 women);male gender    Objective:    Today's Vitals   05/01/23 0855  Weight: 193 lb (87.5 kg)  Height: 6' (1.829 m)   Body mass index is 26.18 kg/m.     05/01/2023    9:05 AM 01/10/2022    9:45 AM 01/04/2021   10:30 AM 09/10/2020    8:09 AM 12/31/2019   10:30 AM 12/27/2018    9:35 AM 12/17/2017    1:36 PM  Advanced Directives  Does Patient Have a Medical Advance Directive? No No No Yes Yes No No  Type of Ecologist of [place];Living will    Does patient want to make changes to medical advance directive?    No - Patient declined  Yes (MAU/Ambulatory/Procedural Areas - Information given)   Copy of Healthcare Power of Attorney in Chart?    Yes - validated most recent copy scanned in chart (See row information) No - copy requested    Would patient like information on creating a medical advance directive?  No - Patient declined Yes (ED - Information included in AVS) No - Patient declined   Yes (MAU/Ambulatory/Procedural Areas - Information given)    Current Medications (verified) Outpatient Encounter Medications as of 05/01/2023  Medication Sig   Colchicine (MITIGARE) 0.6 MG CAPS Take 1 capsule by mouth every morning.   hydrocortisone 2.5 % ointment Apply topically 2 (two) times daily. To affected areas on face up to two weeks   ketoconazole (NIZORAL) 2 % cream Apply to the  forehead, temples, and nose QD/BID.   Multiple Vitamins-Minerals (MULTIVITAMIN WITH MINERALS) tablet Take 1 tablet by mouth daily.   naproxen sodium (ALEVE) 220 MG tablet Take 220 mg by mouth daily as needed.   sildenafil (REVATIO) 20 MG tablet TAKE TWO AND A HALF (2.5) TABLETS BY MOUTH 30 MINUTES BEFORE SEXUAL INTERCOURSE   ABRYSVO 120 MCG/0.5ML injection  (Patient not taking: Reported on 05/01/2023)   FLUZONE HIGH-DOSE QUADRIVALENT 0.7 ML SUSY  (Patient not taking: Reported on 05/01/2023)   No facility-administered encounter medications on file as of 05/01/2023.    Allergies (verified) Patient has no known allergies.   History: Past Medical History:  Diagnosis Date   Actinic keratosis    COVID-19 07/29/2020   HX   Erectile dysfunction    Gout    Hx of colonic polyps    Precancerous skin lesion    SOB (shortness of breath)    Past Surgical History:  Procedure Laterality Date   COLONOSCOPY     COLONOSCOPY WITH PROPOFOL N/A 01/14/2016   Procedure: COLONOSCOPY WITH PROPOFOL;  Surgeon: Midge Minium, MD;  Location: [facility];  Service: Endoscopy;  Laterality: N/A;   COLONOSCOPY WITH PROPOFOL N/A 07/26/2017   Procedure: COLONOSCOPY WITH PROPOFOL;  Surgeon: Midge Minium, MD;  Location: [facility];  Service: Gastroenterology;  Laterality: N/A;   COLONOSCOPY WITH PROPOFOL N/A 09/10/2020   Procedure: COLONOSCOPY WITH PROPOFOL;  Surgeon: Midge Minium,  MD;  Location: [facility];  Service: Endoscopy;  Laterality: N/A;  priority 4 COVID (+) 07/28/20   POLYPECTOMY  01/14/2016   Procedure: POLYPECTOMY;  Surgeon: Midge Minium, MD;  Location: [facility];  Service: Endoscopy;;   POLYPECTOMY  09/10/2020   Procedure: POLYPECTOMY;  Surgeon: Midge Minium, MD;  Location: [facility];  Service: Endoscopy;;   Family History  Problem Relation Age of Onset   COPD Mother    Heart disease Mother    Hypertension Father    Cancer Brother        bone cancer   Social  History   Socioeconomic History   Marital status: Divorced    Spouse name: Not on file   Number of children: 1   Years of education: 14   Highest education level: Associate degree: occupational, Scientist, product/process development, or vocational program  Occupational History   Occupation: Finance    Comment: RETIRED  Tobacco Use   Smoking status: Former    Packs/day: 1.00    Years: 15.00    Additional pack years: 0.00    Total pack years: 15.00    Types: Cigarettes    Quit date: 12/04/1990    Years since quitting: 32.4   Smokeless tobacco: Never  Vaping Use   Vaping Use: Never used  Substance and Sexual Activity   Alcohol use: Yes    Alcohol/week: 14.0 - 21.0 standard drinks of alcohol    Types: 14 - 21 Cans of beer per week    Comment: 2-3 beers a night   Drug use: No   Sexual activity: Yes  Other Topics Concern   Not on file  Social History Narrative   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    Social Determinants of Health   Financial Resource Strain: Low Risk  (05/01/2023)   Overall Financial Resource Strain (CARDIA)    Difficulty of Paying Living Expenses: Not hard at all  Food Insecurity: No Food Insecurity (01/10/2022)   Hunger Vital Sign    Worried About Running Out of Food in the Last Year: Never true    Ran Out of Food in the Last Year: Never true  Transportation Needs: No Transportation Needs (05/01/2023)   PRAPARE - Administrator, Civil Service (Medical): No    Lack of Transportation (Non-Medical): No  Physical Activity: Sufficiently Active (05/01/2023)   Exercise Vital Sign    Days of Exercise per Week: 5 days    Minutes of Exercise per Session: 40 min  Stress: No Stress Concern Present (05/01/2023)   [employer] of Occupational Health - Occupational Stress Questionnaire    Feeling of Stress : Not at all  Social Connections: Moderately Integrated (05/01/2023)   Social Connection and Isolation Panel [NHANES]    Frequency of Communication with Friends and Family:  More than three times a week    Frequency of Social Gatherings with Friends and Family: More than three times a week    Attends Religious Services: More than 4 times per year    Active Member of [employer] or Organizations: Yes    Attends Engineer, structural: More than 4 times per year    Marital Status: Divorced    Tobacco Counseling Counseling given: Not Answered  Clinical Intake:  Pre-visit preparation completed: Yes  Pain : No/denies pain    BMI - recorded: 26.18 Nutritional Status: BMI 25 -29 Overweight Nutritional Risks: None Diabetes: No  How often do you need to have someone help you when  you read instructions, pamphlets, or other written materials from your doctor or pharmacy?: 1 - Never  Diabetic?no  Interpreter Needed?: No  Comments: lives alone Information entered by :: B.Maigen Mozingo,LPN   Activities of Daily Living    05/01/2023    9:05 AM 05/03/2022   10:53 AM  In your present state of health, do you have any difficulty performing the following activities:  Hearing? 0 0  Vision? 0 0  Difficulty concentrating or making decisions? 0 0  Walking or climbing stairs? 0 0  Dressing or bathing? 0 0  Doing errands, shopping? 0 0  Preparing Food and eating ? N   Using the Toilet? N   In the past six months, have you accidently leaked urine? N   Do you have problems with loss of bowel control? N   Managing your Medications? N   Managing your Finances? N   Housekeeping or managing your Housekeeping? N     Patient Care Team: Malva Limes, MD as PCP - General (Family Medicine) Domingo Madeira, OD as Consulting Physician (Optometry) Willeen Niece, MD (Dermatology) Midge Minium, MD as Consulting Physician (Gastroenterology)  Indicate any recent Medical Services you may have received from other than [facility] providers in the past year (date may be approximate).     Assessment:   This is a routine wellness examination for Tosh.  Hearing/Vision  screen Hearing Screening - Comments:: Adequate hearing Vision Screening - Comments:: Adequate vision Dr Larence Penning  Dietary issues and exercise activities discussed: Current Exercise Habits: Home exercise routine, Type of exercise: walking, Time (Minutes): 40, Frequency (Times/Week): 5, Weekly Exercise (Minutes/Week): 200, Intensity: Mild, Exercise limited by: None identified   Goals Addressed             This Visit's Progress    DIET - EAT MORE FRUITS AND VEGETABLES   On track    DIET - INCREASE WATER INTAKE   On track    Recommend to drink at least 6-8 8oz glasses of water per day.       Depression Screen    05/01/2023    9:02 AM 05/03/2022   10:53 AM 01/10/2022    9:43 AM 05/24/2021    9:40 AM 05/18/2021    1:33 PM 04/20/2021   10:33 AM 01/04/2021   10:27 AM  PHQ 2/9 Scores  PHQ - 2 Score 0 0 0 0 0 0 0  PHQ- 9 Score  1    0     Fall Risk    05/01/2023    8:57 AM 05/03/2022   10:53 AM 01/10/2022    9:46 AM 05/24/2021    9:40 AM 05/18/2021    1:33 PM  Fall Risk   Falls in the past year? 0 0 0 0 0  Number falls in past yr: 0 0 0 0 0  Injury with Fall? 0 0 0 0 0  Risk for fall due to : No Fall Risks No Fall Risks No Fall Risks No Fall Risks No Fall Risks  Follow up Falls prevention discussed;Education provided Falls evaluation completed Falls evaluation completed Falls evaluation completed Falls evaluation completed    FALL RISK PREVENTION PERTAINING TO THE HOME:  Any stairs in or around the home? Yes  If so, are there any without handrails? Yes  Home free of loose throw rugs in walkways, pet beds, electrical cords, etc? Yes  Adequate lighting in your home to reduce risk of falls? Yes   ASSISTIVE DEVICES UTILIZED TO PREVENT FALLS:  Life alert? No  Use of a cane, walker or w/c? No  Grab bars in the bathroom? No  Shower chair or bench in shower? No  Elevated toilet seat or a handicapped toilet? No   Cognitive Function:        05/01/2023    9:11 AM 12/27/2018    9:41 AM  12/17/2017    1:41 PM  6CIT Screen  What Year? 0 points 0 points 0 points  What month? 0 points 0 points 0 points  What time? 0 points 0 points 0 points  Count back from 20 0 points 0 points 0 points  Months in reverse 0 points 0 points 0 points  Repeat phrase 0 points 0 points 4 points  Total Score 0 points 0 points 4 points    Immunizations Immunization History  Administered Date(s) Administered   Influenza, High Dose Seasonal PF 10/20/2016, 09/28/2017, 09/03/2018, 09/11/2020, 08/26/2021   Influenza,inj,Quad PF,6+ Mos 12/01/2015   Influenza-Unspecified 09/28/2017, 09/05/2019   PFIZER(Purple Top)SARS-COV-2 Vaccination 01/27/2020, 02/17/2020, 09/13/2020   Pfizer Covid-19 Vaccine Bivalent Booster 21yrs & up 08/31/2021   Pneumococcal Conjugate-13 11/24/2013   Pneumococcal Polysaccharide-23 11/25/2014   Td 11/22/2012   Tdap 11/22/2012   Zoster Recombinat (Shingrix) 09/05/2019, 06/11/2020   Zoster, Live 11/22/2012    TDAP status: Up to date  Flu Vaccine status: Up to date  Pneumococcal vaccine status: Up to date  Covid-19 vaccine status: Completed vaccines  Qualifies for Shingles Vaccine? Yes   Zostavax completed Yes   Shingrix Completed?: Yes  Screening Tests Health Maintenance  Topic Date Due   COVID-19 Vaccine (5 - 2023-24 season) 08/04/2022   DTaP/Tdap/Td (3 - Td or Tdap) 11/22/2022   INFLUENZA VACCINE  07/05/2023   Medicare Annual Wellness (AWV)  04/30/2024   Colonoscopy  09/10/2025   Pneumonia Vaccine 62+ Years old  Completed   Hepatitis C Screening  Completed   Zoster Vaccines- Shingrix  Completed   HPV VACCINES  Aged Out    Health Maintenance  Health Maintenance Due  Topic Date Due   COVID-19 Vaccine (5 - 2023-24 season) 08/04/2022   DTaP/Tdap/Td (3 - Td or Tdap) 11/22/2022    Colorectal cancer screening: Type of screening: Colonoscopy. Completed yes. Repeat every 5-10 years  Lung Cancer Screening: (Low Dose CT Chest recommended if Age 67-80 years,  30 pack-year currently smoking OR have quit w/in 15years.) does not qualify.   Lung Cancer Screening Referral: no  Additional Screening:  Hepatitis C Screening: does not qualify; Completed yes  Vision Screening: Recommended annual ophthalmology exams for early detection of glaucoma and other disorders of the eye. Is the patient up to date with their annual eye exam?  Yes  Who is the provider or what is the name of the office in which the patient attends annual eye exams? Dr Larence Penning If pt is not established with a provider, would they like to be referred to a provider to establish care? No .   Dental Screening: Recommended annual dental exams for proper oral hygiene  Community Resource Referral / Chronic Care Management: CRR required this visit?  No   CCM required this visit?  No      Plan:     I have personally reviewed and noted the following in the patient's chart:   Medical and social history Use of alcohol, tobacco or illicit drugs  Current medications and supplements including opioid prescriptions. Patient is not currently taking opioid prescriptions. Functional ability and status Nutritional status Physical activity Advanced directives List  of other physicians Hospitalizations, surgeries, and ER visits in previous 12 months Vitals Screenings to include cognitive, depression, and falls Referrals and appointments  In addition, I have reviewed and discussed with patient certain preventive protocols, quality metrics, and best practice recommendations. A written personalized care plan for preventive services as well as general preventive health recommendations were provided to patient.     Sue Lush, LPN   1/61/0960   Nurse Notes: The patient states they are doing well and has no concerns or questions at this time.

## 2023-05-01 NOTE — Patient Instructions (Signed)
Michael Hicks , Thank you for taking time to come for your Medicare Wellness Visit. I appreciate your ongoing commitment to your health goals. Please review the following plan we discussed and let me know if I can assist you in the future.   These are the goals we discussed:  Goals      DIET - EAT MORE FRUITS AND VEGETABLES     DIET - INCREASE WATER INTAKE     Recommend to drink at least 6-8 8oz glasses of water per day.        This is a list of the screening recommended for you and due dates:  Health Maintenance  Topic Date Due   COVID-19 Vaccine (5 - 2023-24 season) 08/04/2022   DTaP/Tdap/Td vaccine (3 - Td or Tdap) 11/22/2022   Flu Shot  07/05/2023   Medicare Annual Wellness Visit  04/30/2024   Colon Cancer Screening  09/10/2025   Pneumonia Vaccine  Completed   Hepatitis C Screening  Completed   Zoster (Shingles) Vaccine  Completed   HPV Vaccine  Aged Out    Advanced directives: none: mailed paperwork per request  Conditions/risks identified: none  Next appointment: Follow up in one year for your annual wellness visit. 05/05/2024 @ 8:45am telephone  Preventive Care 65 Years and Older, Male  Preventive care refers to lifestyle choices and visits with your health care provider that can promote health and wellness. What does preventive care include? A yearly physical exam. This is also called an annual well check. Dental exams once or twice a year. Routine eye exams. Ask your health care provider how often you should have your eyes checked. Personal lifestyle choices, including: Daily care of your teeth and gums. Regular physical activity. Eating a healthy diet. Avoiding tobacco and drug use. Limiting alcohol use. Practicing safe sex. Taking low doses of aspirin every day. Taking vitamin and mineral supplements as recommended by your health care provider. What happens during an annual well check? The services and screenings done by your health care provider during your  annual well check will depend on your age, overall health, lifestyle risk factors, and family history of disease. Counseling  Your health care provider may ask you questions about your: Alcohol use. Tobacco use. Drug use. Emotional well-being. Home and relationship well-being. Sexual activity. Eating habits. History of falls. Memory and ability to understand (cognition). Work and work Astronomer. Screening  You may have the following tests or measurements: Height, weight, and BMI. Blood pressure. Lipid and cholesterol levels. These may be checked every 5 years, or more frequently if you are over 9 years old. Skin check. Lung cancer screening. You may have this screening every year starting at age 29 if you have a 30-pack-year history of smoking and currently smoke or have quit within the past 15 years. Fecal occult blood test (FOBT) of the stool. You may have this test every year starting at age 90. Flexible sigmoidoscopy or colonoscopy. You may have a sigmoidoscopy every 5 years or a colonoscopy every 10 years starting at age 65. Prostate cancer screening. Recommendations will vary depending on your family history and other risks. Hepatitis C blood test. Hepatitis B blood test. Sexually transmitted disease (STD) testing. Diabetes screening. This is done by checking your blood sugar (glucose) after you have not eaten for a while (fasting). You may have this done every 1-3 years. Abdominal aortic aneurysm (AAA) screening. You may need this if you are a current or former smoker. Osteoporosis. You may be screened  starting at age 73 if you are at high risk. Talk with your health care provider about your test results, treatment options, and if necessary, the need for more tests. Vaccines  Your health care provider may recommend certain vaccines, such as: Influenza vaccine. This is recommended every year. Tetanus, diphtheria, and acellular pertussis (Tdap, Td) vaccine. You may need a Td  booster every 10 years. Zoster vaccine. You may need this after age 72. Pneumococcal 13-valent conjugate (PCV13) vaccine. One dose is recommended after age 44. Pneumococcal polysaccharide (PPSV23) vaccine. One dose is recommended after age 73. Talk to your health care provider about which screenings and vaccines you need and how often you need them. This information is not intended to replace advice given to you by your health care provider. Make sure you discuss any questions you have with your health care provider. Document Released: 12/17/2015 Document Revised: 08/09/2016 Document Reviewed: 09/21/2015 Elsevier Interactive Patient Education  2017 [employer].  Fall Prevention in the Home Falls can cause injuries. They can happen to people of all ages. There are many things you can do to make your home safe and to help prevent falls. What can I do on the outside of my home? Regularly fix the edges of walkways and driveways and fix any cracks. Remove anything that might make you trip as you walk through a door, such as a raised step or threshold. Trim any bushes or trees on the path to your home. Use bright outdoor lighting. Clear any walking paths of anything that might make someone trip, such as rocks or tools. Regularly check to see if handrails are loose or broken. Make sure that both sides of any steps have handrails. Any raised decks and porches should have guardrails on the edges. Have any leaves, snow, or ice cleared regularly. Use sand or salt on walking paths during winter. Clean up any spills in your garage right away. This includes oil or grease spills. What can I do in the bathroom? Use night lights. Install grab bars by the toilet and in the tub and shower. Do not use towel bars as grab bars. Use non-skid mats or decals in the tub or shower. If you need to sit down in the shower, use a plastic, non-slip stool. Keep the floor dry. Clean up any water that spills on the floor  as soon as it happens. Remove soap buildup in the tub or shower regularly. Attach bath mats securely with double-sided non-slip rug tape. Do not have throw rugs and other things on the floor that can make you trip. What can I do in the bedroom? Use night lights. Make sure that you have a light by your bed that is easy to reach. Do not use any sheets or blankets that are too big for your bed. They should not hang down onto the floor. Have a firm chair that has side arms. You can use this for support while you get dressed. Do not have throw rugs and other things on the floor that can make you trip. What can I do in the kitchen? Clean up any spills right away. Avoid walking on wet floors. Keep items that you use a lot in easy-to-reach places. If you need to reach something above you, use a strong step stool that has a grab bar. Keep electrical cords out of the way. Do not use floor polish or wax that makes floors slippery. If you must use wax, use non-skid floor wax. Do not have throw  rugs and other things on the floor that can make you trip. What can I do with my stairs? Do not leave any items on the stairs. Make sure that there are handrails on both sides of the stairs and use them. Fix handrails that are broken or loose. Make sure that handrails are as long as the stairways. Check any carpeting to make sure that it is firmly attached to the stairs. Fix any carpet that is loose or worn. Avoid having throw rugs at the top or bottom of the stairs. If you do have throw rugs, attach them to the floor with carpet tape. Make sure that you have a light switch at the top of the stairs and the bottom of the stairs. If you do not have them, ask someone to add them for you. What else can I do to help prevent falls? Wear shoes that: Do not have high heels. Have rubber bottoms. Are comfortable and fit you well. Are closed at the toe. Do not wear sandals. If you use a stepladder: Make sure that it is  fully opened. Do not climb a closed stepladder. Make sure that both sides of the stepladder are locked into place. Ask someone to hold it for you, if possible. Clearly mark and make sure that you can see: Any grab bars or handrails. First and last steps. Where the edge of each step is. Use tools that help you move around (mobility aids) if they are needed. These include: Canes. Walkers. Scooters. Crutches. Turn on the lights when you go into a dark area. Replace any light bulbs as soon as they burn out. Set up your furniture so you have a clear path. Avoid moving your furniture around. If any of your floors are uneven, fix them. If there are any pets around you, be aware of where they are. Review your medicines with your doctor. Some medicines can make you feel dizzy. This can increase your chance of falling. Ask your doctor what other things that you can do to help prevent falls. This information is not intended to replace advice given to you by your health care provider. Make sure you discuss any questions you have with your health care provider. Document Released: 09/16/2009 Document Revised: 04/27/2016 Document Reviewed: 12/25/2014 Elsevier Interactive Patient Education  2017 [employer].

## 2023-05-11 ENCOUNTER — Encounter: Payer: Self-pay | Admitting: Family Medicine

## 2023-05-11 ENCOUNTER — Ambulatory Visit (INDEPENDENT_AMBULATORY_CARE_PROVIDER_SITE_OTHER): Payer: Medicare HMO | Admitting: Family Medicine

## 2023-05-11 VITALS — BP 136/73 | HR 66 | Ht 72.0 in | Wt 180.6 lb

## 2023-05-11 DIAGNOSIS — Z125 Encounter for screening for malignant neoplasm of prostate: Secondary | ICD-10-CM | POA: Diagnosis not present

## 2023-05-11 DIAGNOSIS — Z23 Encounter for immunization: Secondary | ICD-10-CM

## 2023-05-11 DIAGNOSIS — Z8739 Personal history of other diseases of the musculoskeletal system and connective tissue: Secondary | ICD-10-CM | POA: Diagnosis not present

## 2023-05-11 DIAGNOSIS — N529 Male erectile dysfunction, unspecified: Secondary | ICD-10-CM

## 2023-05-11 DIAGNOSIS — Z0000 Encounter for general adult medical examination without abnormal findings: Secondary | ICD-10-CM

## 2023-05-11 DIAGNOSIS — Z86010 Personal history of colonic polyps: Secondary | ICD-10-CM | POA: Diagnosis not present

## 2023-05-11 MED ORDER — TETANUS-DIPHTH-ACELL PERTUSSIS 5-2.5-18.5 LF-MCG/0.5 IM SUSY
0.5000 mL | PREFILLED_SYRINGE | Freq: Once | INTRAMUSCULAR | 0 refills | Status: AC
Start: 2023-05-11 — End: 2023-05-11

## 2023-05-11 NOTE — Progress Notes (Signed)
I,[name],acting as a Neurosurgeon for Michael Merry, MD.,have documented all relevant documentation on the behalf of Michael Merry, MD,as directed by  Michael Merry, MD  Complete physical exam   Patient: Michael Hicks   DOB: 1946/05/08   77 y.o. Male  MRN: [id number] Visit Date: 05/11/2023  Today's healthcare provider: [name], MD   Chief Complaint  Patient presents with   Annual Exam   Subjective    Michael Hicks is a 77 y.o. male who presents today for a complete physical exam.  He reports consuming a general diet.  The patient reports walking one mile daily taking about 25-30 minutes.  He generally feels well. He reports sleeping well. He does not have additional problems to discuss today.   He does have history of gout for which he takes colchicine every day and has not had flare for years.   Past Medical History:  Diagnosis Date   Actinic keratosis    COVID-19 07/29/2020   HX   Erectile dysfunction    Gout    Hx of colonic polyps    Precancerous skin lesion    SOB (shortness of breath)    Past Surgical History:  Procedure Laterality Date   COLONOSCOPY     COLONOSCOPY WITH PROPOFOL N/A 01/14/2016   Procedure: COLONOSCOPY WITH PROPOFOL;  Surgeon: Midge Minium, MD;  Location: [facility];  Service: Endoscopy;  Laterality: N/A;   COLONOSCOPY WITH PROPOFOL N/A 07/26/2017   Procedure: COLONOSCOPY WITH PROPOFOL;  Surgeon: Midge Minium, MD;  Location: [facility];  Service: Gastroenterology;  Laterality: N/A;   COLONOSCOPY WITH PROPOFOL N/A 09/10/2020   Procedure: COLONOSCOPY WITH PROPOFOL;  Surgeon: Midge Minium, MD;  Location: [facility];  Service: Endoscopy;  Laterality: N/A;  priority 4 COVID (+) 07/28/20   POLYPECTOMY  01/14/2016   Procedure: POLYPECTOMY;  Surgeon: Midge Minium, MD;  Location: [facility];  Service: Endoscopy;;   POLYPECTOMY  09/10/2020   Procedure: POLYPECTOMY;  Surgeon: Midge Minium, MD;  Location: [facility];  Service: Endoscopy;;   Social History   Socioeconomic History   Marital status: Divorced    Spouse name: Not on file   Number of children: 1   Years of education: 14   Highest education level: Associate degree: occupational, Scientist, product/process development, or vocational program  Occupational History   Occupation: Finance    Comment: RETIRED  Tobacco Use   Smoking status: Former    Packs/day: 1.00    Years: 15.00    Additional pack years: 0.00    Total pack years: 15.00    Types: Cigarettes    Quit date: 12/04/1990    Years since quitting: 32.4   Smokeless tobacco: Never  Vaping Use   Vaping Use: Never used  Substance and Sexual Activity   Alcohol use: Yes    Alcohol/week: 14.0 - 21.0 standard drinks of alcohol    Types: 14 - 21 Cans of beer per week    Comment: 2-3 beers a night   Drug use: No   Sexual activity: Yes  Other Topics Concern   Not on file  Social History Narrative   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    Social Determinants of Health   Financial Resource Strain: Low Risk  (05/01/2023)   Overall Financial Resource Strain (CARDIA)    Difficulty of Paying Living Expenses: Not hard at all  Food Insecurity: No Food Insecurity (01/10/2022)   Hunger Vital Sign    Worried About  Running Out of Food in the Last Year: Never true    Ran Out of Food in the Last Year: Never true  Transportation Needs: No Transportation Needs (05/01/2023)   PRAPARE - Administrator, Civil Service (Medical): No    Lack of Transportation (Non-Medical): No  Physical Activity: Sufficiently Active (05/01/2023)   Exercise Vital Sign    Days of Exercise per Week: 5 days    Minutes of Exercise per Session: 40 min  Stress: No Stress Concern Present (05/01/2023)   [employer] of Occupational Health - Occupational Stress Questionnaire    Feeling of Stress : Not at all  Social Connections: Moderately Integrated (05/01/2023)   Social Connection and Isolation Panel [NHANES]     Frequency of Communication with Friends and Family: More than three times a week    Frequency of Social Gatherings with Friends and Family: More than three times a week    Attends Religious Services: More than 4 times per year    Active Member of Clubs or Organizations: Yes    Attends Banker Meetings: More than 4 times per year    Marital Status: Divorced  Intimate Partner Violence: Not At Risk (05/01/2023)   Humiliation, Afraid, Rape, and Kick questionnaire    Fear of Current or Ex-Partner: No    Emotionally Abused: No    Physically Abused: No    Sexually Abused: No   Family Status  Relation Name Status   Mother  Deceased   Father  Deceased       cerebrovascular accident   Brother  Deceased   Brother  Alive   Brother  Alive   Family History  Problem Relation Age of Onset   COPD Mother    Heart disease Mother    Hypertension Father    Cancer Brother        bone cancer   No Known Allergies  Patient Care Team: Malva Limes, MD as PCP - General (Family Medicine) Domingo Madeira, OD as Consulting Physician (Optometry) Willeen Niece, MD (Dermatology) Midge Minium, MD as Consulting Physician (Gastroenterology)   Medications: Outpatient Medications Prior to Visit  Medication Sig   Colchicine (MITIGARE) 0.6 MG CAPS Take 1 capsule by mouth every morning.   Multiple Vitamins-Minerals (MULTIVITAMIN WITH MINERALS) tablet Take 1 tablet by mouth daily.   naproxen sodium (ALEVE) 220 MG tablet Take 220 mg by mouth daily as needed.   sildenafil (REVATIO) 20 MG tablet TAKE TWO AND A HALF (2.5) TABLETS BY MOUTH 30 MINUTES BEFORE SEXUAL INTERCOURSE   [DISCONTINUED] hydrocortisone 2.5 % ointment Apply topically 2 (two) times daily. To affected areas on face up to two weeks (Patient not taking: Reported on 05/11/2023)   [DISCONTINUED] ketoconazole (NIZORAL) 2 % cream Apply to the forehead, temples, and nose QD/BID. (Patient not taking: Reported on 05/11/2023)   No  facility-administered medications prior to visit.    Review of Systems  Constitutional:  Negative for chills, diaphoresis and fever.  HENT:  Positive for dental problem. Negative for congestion, ear discharge, ear pain, hearing loss, nosebleeds, sore throat and tinnitus.   Eyes:  Negative for photophobia, pain, discharge and redness.  Respiratory:  Negative for cough, shortness of breath, wheezing and stridor.   Cardiovascular:  Negative for chest pain, palpitations and leg swelling.  Gastrointestinal:  Negative for abdominal pain, blood in stool, constipation, diarrhea, nausea and vomiting.  Endocrine: Negative for polydipsia.  Genitourinary:  Negative for dysuria, flank pain, frequency, hematuria and urgency.  Musculoskeletal:  Negative for back pain, myalgias and neck pain.  Skin:  Negative for rash.  Allergic/Immunologic: Negative for environmental allergies.  Neurological:  Negative for dizziness, tremors, seizures, weakness and headaches.  Hematological:  Does not bruise/bleed easily.  Psychiatric/Behavioral:  Negative for hallucinations and suicidal ideas. The patient is not nervous/anxious.       Objective    BP 136/73 (BP Location: Left Arm, Patient Position: Sitting, Cuff Size: Normal)   Pulse 66   Ht 6' (1.829 m)   Wt 180 lb 9.6 oz (81.9 kg)   BMI 24.49 kg/m    Physical Exam   General Appearance:    Well developed, well nourished male. Alert, cooperative, in no acute distress, appears stated age  Head:    Normocephalic, without obvious abnormality, atraumatic  Eyes:    PERRL, conjunctiva/corneas clear, EOM's intact, fundi    benign, both eyes       Ears:    Normal TM's and external ear canals, both ears  Nose:   Nares normal, septum midline, mucosa normal, no drainage   or sinus tenderness  Throat:   Lips, mucosa, and tongue normal; teeth and gums normal  Neck:   Supple, symmetrical, trachea midline, no adenopathy;       thyroid:  No  enlargement/tenderness/nodules; no carotid   bruit or JVD  Back:     Symmetric, no curvature, ROM normal, no CVA tenderness  Lungs:     Clear to auscultation bilaterally, respirations unlabored  Chest wall:    No tenderness or deformity  Heart:    Normal heart rate. Normal rhythm. No murmurs, rubs, or gallops.  S1 and S2 normal  Abdomen:     Soft, non-tender, bowel sounds active all four quadrants,    no masses, no organomegaly  Genitalia:    deferred  Rectal:    deferred  Extremities:   All extremities are intact. No cyanosis or edema  Pulses:   2+ and symmetric all extremities  Skin:   Skin color, texture, turgor normal, no rashes or lesions  Lymph nodes:   Cervical, supraclavicular, and axillary nodes normal  Neurologic:   CNII-XII intact. Normal strength, sensation and reflexes      throughout     Last depression screening scores    05/01/2023    9:02 AM 05/03/2022   10:53 AM 01/10/2022    9:43 AM  PHQ 2/9 Scores  PHQ - 2 Score 0 0 0  PHQ- 9 Score  1    Last fall risk screening    05/01/2023    8:57 AM  Fall Risk   Falls in the past year? 0  Number falls in past yr: 0  Injury with Fall? 0  Risk for fall due to : No Fall Risks  Follow up Falls prevention discussed;Education provided   Last Audit-C alcohol use screening    05/01/2023    9:01 AM  Alcohol Use Disorder Test (AUDIT)  1. How often do you have a drink containing alcohol? 0  2. How many drinks containing alcohol do you have on a typical day when you are drinking? 0  3. How often do you have six or more drinks on one occasion? 0  AUDIT-C Score 0   A score of 3 or more in women, and 4 or more in men indicates increased risk for alcohol abuse, EXCEPT if all of the points are from question 1     Assessment & Plan    Routine Health Maintenance and  Physical Exam  Exercise Activities and Dietary recommendations  Goals      DIET - EAT MORE FRUITS AND VEGETABLES     DIET - INCREASE WATER INTAKE     Recommend  to drink at least 6-8 8oz glasses of water per day.         Immunization History  Administered Date(s) Administered   Influenza, High Dose Seasonal PF 10/20/2016, 09/28/2017, 09/03/2018, 09/11/2020, 08/26/2021   Influenza,inj,Quad PF,6+ Mos 12/01/2015   Influenza-Unspecified 09/28/2017, 09/05/2019   PFIZER(Purple Top)SARS-COV-2 Vaccination 01/27/2020, 02/17/2020, 09/13/2020   Pfizer Covid-19 Vaccine Bivalent Booster 57yrs & up 08/31/2021   Pneumococcal Conjugate-13 11/24/2013   Pneumococcal Polysaccharide-23 11/25/2014   Td 11/22/2012   Tdap 11/22/2012   Zoster Recombinat (Shingrix) 09/05/2019, 06/11/2020   Zoster, Live 11/22/2012    Health Maintenance  Topic Date Due   COVID-19 Vaccine (5 - 2023-24 season) 08/04/2022   DTaP/Tdap/Td (3 - Td or Tdap) 11/22/2022   INFLUENZA VACCINE  07/05/2023   Medicare Annual Wellness (AWV)  04/30/2024   Colonoscopy  09/10/2025   Pneumonia Vaccine 73+ Years old  Completed   Hepatitis C Screening  Completed   Zoster Vaccines- Shingrix  Completed   HPV VACCINES  Aged Out    Discussed health benefits of physical activity, and encouraged him to engage in regular exercise appropriate for his age and condition.   - Comprehensive metabolic panel - CBC with Differential/Platelet - Lipid panel  2. Personal history of colonic polyps Followed by Dr. Servando Snare.   3. ED (erectile dysfunction) of organic origin  - Comprehensive metabolic panel - CBC with Differential/Platelet - Lipid panel  4. Prostate cancer screening  - PSA  5. Prescription for Tdap. Vaccine not administered in office.   - Tdap (BOOSTRIX) 5-2.5-18.5 LF-MCG/0.5 injection; Inject 0.5 mLs into the muscle once for 1 dose.  Dispense: 0.5 mL; Refill: 0  6. Personal history of gout Well controlled on colchicine which he is currently taking daily.       The entirety of the information documented in the History of Present Illness, Review of Systems and Physical Exam were  personally obtained by me. Portions of this information were initially documented by the CMA and reviewed by me for thoroughness and accuracy.     Michael Merry, MD  [facility] [phone] (phone) [phone] (fax)  [facility]

## 2023-05-11 NOTE — Patient Instructions (Signed)
Please review the attached list of medications and notify my office if there are any errors.   You are due for a Tdap (tetanus-diptheria-pertussis vaccine) which protects you from tetanus and whooping cough. Please check with your insurance plan or pharmacy regarding coverage for this vaccine.   

## 2023-05-12 LAB — COMPREHENSIVE METABOLIC PANEL
ALT: 15 IU/L (ref 0–44)
AST: 23 IU/L (ref 0–40)
Albumin/Globulin Ratio: 1.7 (ref 1.2–2.2)
Albumin: 4.3 g/dL (ref 3.8–4.8)
Alkaline Phosphatase: 105 IU/L (ref 44–121)
BUN/Creatinine Ratio: 24 (ref 10–24)
BUN: 21 mg/dL (ref 8–27)
Bilirubin Total: 0.8 mg/dL (ref 0.0–1.2)
CO2: 23 mmol/L (ref 20–29)
Calcium: 9.6 mg/dL (ref 8.6–10.2)
Chloride: 100 mmol/L (ref 96–106)
Creatinine, Ser: 0.87 mg/dL (ref 0.76–1.27)
Globulin, Total: 2.5 g/dL (ref 1.5–4.5)
Glucose: 88 mg/dL (ref 70–99)
Potassium: 4.6 mmol/L (ref 3.5–5.2)
Sodium: 141 mmol/L (ref 134–144)
Total Protein: 6.8 g/dL (ref 6.0–8.5)
eGFR: 91 mL/min/{1.73_m2} (ref >59)

## 2023-05-12 LAB — CBC WITH DIFFERENTIAL/PLATELET
Basophils Absolute: 0.1 10*3/uL (ref 0.0–0.2)
Basos: 1 %
EOS (ABSOLUTE): 0.2 10*3/uL (ref 0.0–0.4)
Eos: 2 %
Hematocrit: 41.1 % (ref 37.5–51.0)
Hemoglobin: 13.8 g/dL (ref 13.0–17.7)
Immature Grans (Abs): 0 10*3/uL (ref 0.0–0.1)
Immature Granulocytes: 1 %
Lymphocytes Absolute: 1.6 10*3/uL (ref 0.7–3.1)
Lymphs: 25 %
MCH: 33.5 pg — ABNORMAL HIGH (ref 26.6–33.0)
MCHC: 33.6 g/dL (ref 31.5–35.7)
MCV: 100 fL — ABNORMAL HIGH (ref 79–97)
Monocytes Absolute: 0.7 10*3/uL (ref 0.1–0.9)
Monocytes: 11 %
Neutrophils Absolute: 3.9 10*3/uL (ref 1.4–7.0)
Neutrophils: 60 %
Platelets: 188 10*3/uL (ref 150–450)
RBC: 4.12 x10E6/uL — ABNORMAL LOW (ref 4.14–5.80)
RDW: 11.6 % (ref 11.6–15.4)
WBC: 6.4 10*3/uL (ref 3.4–10.8)

## 2023-05-12 LAB — PSA: Prostate Specific Ag, Serum: 1.5 ng/mL (ref 0.0–4.0)

## 2023-05-12 LAB — LIPID PANEL
Chol/HDL Ratio: 3.3 ratio (ref 0.0–5.0)
Cholesterol, Total: 180 mg/dL (ref 100–199)
HDL: 54 mg/dL (ref >39)
LDL Chol Calc (NIH): 95 mg/dL (ref 0–99)
Triglycerides: 180 mg/dL — ABNORMAL HIGH (ref 0–149)
VLDL Cholesterol Cal: 31 mg/dL (ref 5–40)

## 2023-05-29 ENCOUNTER — Other Ambulatory Visit: Payer: Self-pay | Admitting: Family Medicine

## 2023-05-30 NOTE — Telephone Encounter (Signed)
Requested Prescriptions  Pending Prescriptions Disp Refills   MITIGARE 0.6 MG CAPS [Pharmacy Med Name: MITIGARE 0.6 MG CAPSULE] 30 capsule 5    Sig: TAKE 1 CAPSULE BY MOUTH EVERY MORNING     Endocrinology:  Gout Agents - colchicine Passed - 05/29/2023 11:23 AM      Passed - Cr in normal range and within 360 days    Creatinine, Ser  Date Value Ref Range Status  05/11/2023 0.87 0.76 - 1.27 mg/dL Final         Passed - ALT in normal range and within 360 days    ALT  Date Value Ref Range Status  05/11/2023 15 0 - 44 IU/L Final         Passed - AST in normal range and within 360 days    AST  Date Value Ref Range Status  05/11/2023 23 0 - 40 IU/L Final         Passed - Valid encounter within last 12 months    Recent Outpatient Visits           2 weeks ago Annual physical exam   [place] [facility] Malva Limes, MD   1 year ago Annual physical exam   [place] [facility] Malva Limes, MD   2 years ago Acute gout involving toe, unspecified cause, unspecified laterality   [facility] Vigg, Avanti, MD   2 years ago Localized swelling of left foot   [place] [facility] Vigg, Avanti, MD   2 years ago Annual physical exam   [facility] Malva Limes, MD       Future Appointments             In 5 months Willeen Niece, MD [facility]   In 11 months Fisher, Demetrios Isaacs, MD [facility]            Passed - CBC within normal limits and completed in the last 12 months    WBC  Date Value Ref Range Status  05/11/2023 6.4 3.4 - 10.8 x10E3/uL Final   RBC  Date Value Ref Range Status  05/11/2023 4.12 (L) 4.14 - 5.80 x10E6/uL Final   Hemoglobin  Date Value Ref Range Status  05/11/2023 13.8 13.0 - 17.7 g/dL Final   Hematocrit  Date Value Ref Range Status  05/11/2023 41.1 37.5 - 51.0 % Final   MCHC  Date Value  Ref Range Status  05/11/2023 33.6 31.5 - 35.7 g/dL Final   [facility]  Date Value Ref Range Status  05/11/2023 33.5 (H) 26.6 - 33.0 pg Final   MCV  Date Value Ref Range Status  05/11/2023 100 (H) 79 - 97 fL Final   No results found for: "PLTCOUNTKUC", "LABPLAT", "POCPLA" RDW  Date Value Ref Range Status  05/11/2023 11.6 11.6 - 15.4 % Final

## 2023-06-13 ENCOUNTER — Ambulatory Visit: Payer: Medicare HMO | Admitting: Family Medicine

## 2023-07-03 ENCOUNTER — Ambulatory Visit: Payer: Medicare HMO | Admitting: Podiatry

## 2023-07-03 DIAGNOSIS — M79675 Pain in left toe(s): Secondary | ICD-10-CM

## 2023-07-03 DIAGNOSIS — B351 Tinea unguium: Secondary | ICD-10-CM | POA: Diagnosis not present

## 2023-07-03 DIAGNOSIS — M79674 Pain in right toe(s): Secondary | ICD-10-CM

## 2023-07-03 NOTE — Progress Notes (Signed)
  Subjective:  Patient ID: Michael Hicks, male    DOB: 08/06/1946,  MRN: [id number]  Chief Complaint  Patient presents with   Nail Problem    Nail trim    76 y.o. male returns for the above complaint.  Patient presents with thickened elongated dystrophic toenails x10.  Painful to touch.  Patient would like for me to debride down.  He is not diabetic.  He denies any other acute complaints.  Does not have any secondary complaints today  Objective:  There were no vitals filed for this visit. Podiatric Exam: Vascular: dorsalis pedis and posterior tibial pulses are palpable bilateral. Capillary return is immediate. Temperature gradient is WNL. Skin turgor WNL  Sensorium: Normal Semmes Weinstein monofilament test. Normal tactile sensation bilaterally. Nail Exam: Pt has thick disfigured discolored nails with subungual debris noted bilateral entire nail hallux through fifth toenails.  Pain on palpation to the nails. Ulcer Exam: There is no evidence of ulcer or pre-ulcerative changes or infection. Orthopedic Exam: Muscle tone and strength are WNL. No limitations in general ROM. No crepitus or effusions noted. HAV  B/L.  Hammer toes 2-5  B/L. Skin: No Porokeratosis. No infection or ulcers.  No further dry skin noted    Assessment & Plan:   1. Pain due to onychomycosis of toenails of both feet         Patient was evaluated and treated and all questions answered.  Xerosis skin -Clinically resolving with over-the-counter medications  Onychomycosis with pain  -Nails palliatively debrided as below. -Educated on self-care  Procedure: Nail Debridement Rationale: pain  Type of Debridement: manual, sharp debridement. Instrumentation: Nail nipper, rotary burr. Number of Nails: 10  Procedures and Treatment: Consent by patient was obtained for treatment procedures. The patient understood the discussion of treatment and procedures well. All questions were answered thoroughly reviewed.  Debridement of mycotic and hypertrophic toenails, 1 through 5 bilateral and clearing of subungual debris. No ulceration, no infection noted.  Return Visit-Office Procedure: Patient instructed to return to the office for a follow up visit 3 months for continued evaluation and treatment.  Kevin Patel, DPM    No follow-ups on file. 

## 2023-08-28 ENCOUNTER — Telehealth: Payer: Self-pay | Admitting: Family Medicine

## 2023-08-28 DIAGNOSIS — Z0279 Encounter for issue of other medical certificate: Secondary | ICD-10-CM

## 2023-08-28 NOTE — Telephone Encounter (Signed)
Sending back a [employer] rec form to be signed off for patient to be able to hike.   Fax to # on the form when completed.

## 2023-09-17 ENCOUNTER — Telehealth: Payer: Self-pay

## 2023-09-17 NOTE — Telephone Encounter (Signed)
Copied from CRM [id number]. Topic: General - Other >> Sep 17, 2023  9:16 AM Everette C wrote: Reason for CRM: The patient would like to be contacted by a member of administrative staff when possible about a document they previously dropped off at the practice  Please contact when possible

## 2023-09-17 NOTE — Telephone Encounter (Signed)
Followed up with patient.Michael KitchenMarland KitchenLVM for patient to return call

## 2023-10-02 ENCOUNTER — Encounter: Payer: Self-pay | Admitting: Podiatry

## 2023-10-02 ENCOUNTER — Ambulatory Visit: Payer: Medicare HMO | Admitting: Podiatry

## 2023-10-02 VITALS — BP 145/74 | HR 69

## 2023-10-02 DIAGNOSIS — M79674 Pain in right toe(s): Secondary | ICD-10-CM

## 2023-10-02 DIAGNOSIS — M79675 Pain in left toe(s): Secondary | ICD-10-CM

## 2023-10-02 DIAGNOSIS — B351 Tinea unguium: Secondary | ICD-10-CM | POA: Diagnosis not present

## 2023-10-02 NOTE — Progress Notes (Signed)
  Subjective:  Patient ID: Michael Hicks, male    DOB: 1946/09/22,  MRN: [id number]  Chief Complaint  Patient presents with   Nail Problem    "Trim those toenails up."   77 y.o. male returns for the above complaint.  Patient presents with thickened elongated dystrophic toenails x10.  Painful to touch.  Patient would like for me to debride down.  He is not diabetic.  He denies any other acute complaints.  Does not have any secondary complaints today  Objective:   Vitals:   10/02/23 1038  BP: (!) 145/74  Pulse: 69   Podiatric Exam: Vascular: dorsalis pedis and posterior tibial pulses are palpable bilateral. Capillary return is immediate. Temperature gradient is WNL. Skin turgor WNL  Sensorium: Normal Semmes Weinstein monofilament test. Normal tactile sensation bilaterally. Nail Exam: Pt has thick disfigured discolored nails with subungual debris noted bilateral entire nail hallux through fifth toenails.  Pain on palpation to the nails. Ulcer Exam: There is no evidence of ulcer or pre-ulcerative changes or infection. Orthopedic Exam: Muscle tone and strength are WNL. No limitations in general ROM. No crepitus or effusions noted. HAV  B/L.  Hammer toes 2-5  B/L. Skin: No Porokeratosis. No infection or ulcers.  No further dry skin noted    Assessment & Plan:   No diagnosis found.        Patient was evaluated and treated and all questions answered.  Xerosis skin -Clinically resolving with over-the-counter medications  Onychomycosis with pain  -Nails palliatively debrided as below. -Educated on self-care  Procedure: Nail Debridement Rationale: pain  Type of Debridement: manual, sharp debridement. Instrumentation: Nail nipper, rotary burr. Number of Nails: 10  Procedures and Treatment: Consent by patient was obtained for treatment procedures. The patient understood the discussion of treatment and procedures well. All questions were answered thoroughly reviewed.  Debridement of mycotic and hypertrophic toenails, 1 through 5 bilateral and clearing of subungual debris. No ulceration, no infection noted.  Return Visit-Office Procedure: Patient instructed to return to the office for a follow up visit 3 months for continued evaluation and treatment.  Nicholes Rough, DPM    No follow-ups on file.

## 2023-11-13 ENCOUNTER — Encounter: Payer: Self-pay | Admitting: Dermatology

## 2023-11-13 ENCOUNTER — Ambulatory Visit: Payer: Medicare HMO | Admitting: Dermatology

## 2023-11-13 DIAGNOSIS — L719 Rosacea, unspecified: Secondary | ICD-10-CM

## 2023-11-13 DIAGNOSIS — I878 Other specified disorders of veins: Secondary | ICD-10-CM

## 2023-11-13 DIAGNOSIS — D1801 Hemangioma of skin and subcutaneous tissue: Secondary | ICD-10-CM

## 2023-11-13 DIAGNOSIS — L570 Actinic keratosis: Secondary | ICD-10-CM

## 2023-11-13 DIAGNOSIS — D224 Melanocytic nevi of scalp and neck: Secondary | ICD-10-CM | POA: Diagnosis not present

## 2023-11-13 DIAGNOSIS — L814 Other melanin hyperpigmentation: Secondary | ICD-10-CM | POA: Diagnosis not present

## 2023-11-13 DIAGNOSIS — L820 Inflamed seborrheic keratosis: Secondary | ICD-10-CM

## 2023-11-13 DIAGNOSIS — W908XXA Exposure to other nonionizing radiation, initial encounter: Secondary | ICD-10-CM | POA: Diagnosis not present

## 2023-11-13 DIAGNOSIS — Z1283 Encounter for screening for malignant neoplasm of skin: Secondary | ICD-10-CM | POA: Diagnosis not present

## 2023-11-13 DIAGNOSIS — D229 Melanocytic nevi, unspecified: Secondary | ICD-10-CM | POA: Diagnosis not present

## 2023-11-13 DIAGNOSIS — R238 Other skin changes: Secondary | ICD-10-CM

## 2023-11-13 DIAGNOSIS — L300 Nummular dermatitis: Secondary | ICD-10-CM

## 2023-11-13 DIAGNOSIS — L578 Other skin changes due to chronic exposure to nonionizing radiation: Secondary | ICD-10-CM

## 2023-11-13 DIAGNOSIS — L821 Other seborrheic keratosis: Secondary | ICD-10-CM | POA: Diagnosis not present

## 2023-11-13 MED ORDER — METRONIDAZOLE 0.75 % EX GEL: CUTANEOUS | 5 refills | Status: AC

## 2023-11-13 MED ORDER — TRIAMCINOLONE ACETONIDE 0.1 % EX CREA: TOPICAL_CREAM | CUTANEOUS | 2 refills | Status: AC

## 2023-11-13 NOTE — Progress Notes (Signed)
Follow-Up Visit   Subjective  Michael Hicks is a 77 y.o. male who presents for the following: Skin Cancer Screening and Upper Body Skin Exam. No personal hx of skin cancer or dysplastic nevi. Hx of AKs.   The patient presents for Upper Body Skin Exam (UBSE) for skin cancer screening and mole check. The patient has spots, moles and lesions to be evaluated, some may be new or changing and the patient may have concern these could be cancer.    The following portions of the chart were reviewed this encounter and updated as appropriate: medications, allergies, medical history  Review of Systems:  No other skin or systemic complaints except as noted in HPI or Assessment and Plan.  Objective  Well appearing patient in no apparent distress; mood and affect are within normal limits.  All skin waist up examined. Relevant physical exam findings are noted in the Assessment and Plan.  Left Tip of Nose x1, R lateral neck x1, L ear helix x1 (3) Erythematous thin papules/macules with gritty scale.   Left Elbow x2, R forearm x1 (3) Erythematous keratotic or waxy stuck-on papule or plaque.    Assessment & Plan   AK (actinic keratosis) (3) Left Tip of Nose x1, R lateral neck x1, L ear helix x1  Actinic keratoses are precancerous spots that appear secondary to cumulative UV radiation exposure/sun exposure over time. They are chronic with expected duration over 1 year. A portion of actinic keratoses will progress to squamous cell carcinoma of the skin. It is not possible to reliably predict which spots will progress to skin cancer and so treatment is recommended to prevent development of skin cancer.  Recommend daily broad spectrum sunscreen SPF 30+ to sun-exposed areas, reapply every 2 hours as needed.  Recommend staying in the shade or wearing long sleeves, sun glasses (UVA+UVB protection) and wide brim hats (4-inch brim around the entire circumference of the hat). Call for new or changing  lesions.  Destruction of lesion - Left Tip of Nose x1, R lateral neck x1, L ear helix x1 (3)  Destruction method: cryotherapy   Informed consent: discussed and consent obtained   Lesion destroyed using liquid nitrogen: Yes   Region frozen until ice ball extended beyond lesion: Yes   Outcome: patient tolerated procedure well with no complications   Post-procedure details: wound care instructions given   Additional details:  Prior to procedure, discussed risks of blister formation, small wound, skin dyspigmentation, or rare scar following cryotherapy. Recommend Vaseline ointment to treated areas while healing.   Inflamed seborrheic keratosis (3) Left Elbow x2, R forearm x1  Symptomatic, irritating, patient would like treated.  Destruction of lesion - Left Elbow x2, R forearm x1 (3)  Destruction method: cryotherapy   Informed consent: discussed and consent obtained   Lesion destroyed using liquid nitrogen: Yes   Region frozen until ice ball extended beyond lesion: Yes   Outcome: patient tolerated procedure well with no complications   Post-procedure details: wound care instructions given   Additional details:  Prior to procedure, discussed risks of blister formation, small wound, skin dyspigmentation, or rare scar following cryotherapy. Recommend Vaseline ointment to treated areas while healing.    Skin cancer screening performed today.  Actinic Damage. Face, back.  - Chronic condition, secondary to cumulative UV/sun exposure - diffuse scaly erythematous macules with underlying dyspigmentation - Recommend daily broad spectrum sunscreen SPF 30+ to sun-exposed areas, reapply every 2 hours as needed.  - Staying in the shade or  wearing long sleeves, sun glasses (UVA+UVB protection) and wide brim hats (4-inch brim around the entire circumference of the hat) are also recommended for sun protection.  - Call for new or changing lesions.  Lentigines, Seborrheic Keratoses, Hemangiomas -  Benign normal skin lesions. Torso  - Benign-appearing - Call for any changes  Melanocytic Nevi - Tan-brown and/or pink-flesh-colored symmetric macules and papules - Benign appearing on exam today - Observation - Call clinic for new or changing moles - Recommend daily use of broad spectrum spf 30+ sunscreen to sun-exposed areas.   MELANOCYTIC NEVI Exam: Left Posterior Neck 2.5 mm brown macule  Treatment Plan: Benign appearing on exam today. Recommend observation. Call clinic for new or changing moles. Recommend daily use of broad spectrum spf 30+ sunscreen to sun-exposed areas.   LENTIGO Exam:  tan macule at left upper ear helix Due to sun exposure Treatment Plan: Benign-appearing, observe. Recommend daily broad spectrum sunscreen SPF 30+ to sun-exposed areas, reapply every 2 hours as needed.  Call for any changes  [place] Exam: purple papule at left upper ear helix  Treatment Plan: Benign-appearing. Observe   ROSACEA Exam Mid face erythema with telangiectasias and small pink macules/papules  Chronic and persistent condition with duration or expected duration over one year. Condition is bothersome/symptomatic for patient. Currently flared.   Rosacea is a chronic progressive skin condition usually affecting the face of adults, causing redness and/or acne bumps. It is treatable but not curable. It sometimes affects the eyes (ocular rosacea) as well. It may respond to topical and/or systemic medication and can flare with stress, sun exposure, alcohol, exercise, topical steroids (including hydrocortisone/cortisone 10) and some foods.  Daily application of broad spectrum spf 30+ sunscreen to face is recommended to reduce flares.  Patient denies grittiness of the eyes  Treatment Plan Start Metronidazole 0.75% gel to face at bedtime.   Nummular Dermatitis  Exam: Xerosis with mild erythema at upper back  Chronic and persistent condition with duration or expected duration  over one year. Condition is symptomatic/ bothersome to patient. Not currently at goal.   Treatment Plan:   Start Triamcinolone 0.1% cream apply at bedtime to affected areas on body as needed for itching. Avoid applying to face, groin, and axilla. Use as directed. Long-term use can cause thinning of the skin.  Topical steroids (such as triamcinolone, fluocinolone, fluocinonide, mometasone, clobetasol, halobetasol, betamethasone, hydrocortisone) can cause thinning and lightening of the skin if they are used for too long in the same area. Your physician has selected the right strength medicine for your problem and area affected on the body. Please use your medication only as directed by your physician to prevent side effects.   Recommend mild soap and moisturizing cream 1-2 times daily.  Gentle skin care handout provided.     Return in about 1 year (around 11/12/2024) for UBSE, HxAKs.  I, Lawson Radar, CMA, am acting as scribe for Willeen Niece, MD.   Documentation: I have reviewed the above documentation for accuracy and completeness, and I agree with the above.  Willeen Niece, MD

## 2023-11-13 NOTE — Patient Instructions (Addendum)
Cryotherapy Aftercare  Wash gently with soap and water everyday.   Apply Vaseline jelly daily until healed.    Rosacea on face: Start Metronidazole 0.75% gel to face at bedtime.    Itching on torso: Start Triamcinolone 0.1% cream apply at bedtime to affected areas on body as needed for itching. Avoid applying to face, groin, and axilla. Use as directed. Long-term use can cause thinning of the skin.  Topical steroids (such as triamcinolone, fluocinolone, fluocinonide, mometasone, clobetasol, halobetasol, betamethasone, hydrocortisone) can cause thinning and lightening of the skin if they are used for too long in the same area. Your physician has selected the right strength medicine for your problem and area affected on the body. Please use your medication only as directed by your physician to prevent side effects.     Recommend daily broad spectrum sunscreen SPF 30+ to sun-exposed areas, reapply every 2 hours as needed. Call for new or changing lesions.  Staying in the shade or wearing long sleeves, sun glasses (UVA+UVB protection) and wide brim hats (4-inch brim around the entire circumference of the hat) are also recommended for sun protection.      Melanoma ABCDEs  Melanoma is the most dangerous type of skin cancer, and is the leading cause of death from skin disease.  You are more likely to develop melanoma if you: Have light-colored skin, light-colored eyes, or red or blond hair Spend a lot of time in the sun Tan regularly, either outdoors or in a tanning bed Have had blistering sunburns, especially during childhood Have a close family member who has had a melanoma Have atypical moles or large birthmarks  Early detection of melanoma is key since treatment is typically straightforward and cure rates are extremely high if we catch it early.   The first sign of melanoma is often a change in a mole or a new dark spot.  The ABCDE system is a way of remembering the signs of  melanoma.  A for asymmetry:  The two halves do not match. B for border:  The edges of the growth are irregular. C for color:  A mixture of colors are present instead of an even brown color. D for diameter:  Melanomas are usually (but not always) greater than 6mm - the size of a pencil eraser. E for evolution:  The spot keeps changing in size, shape, and color.  Please check your skin once per month between visits. You can use a small mirror in front and a large mirror behind you to keep an eye on the back side or your body.   If you see any new or changing lesions before your next follow-up, please call to schedule a visit.  Please continue daily skin protection including broad spectrum sunscreen SPF 30+ to sun-exposed areas, reapplying every 2 hours as needed when you're outdoors.   Staying in the shade or wearing long sleeves, sun glasses (UVA+UVB protection) and wide brim hats (4-inch brim around the entire circumference of the hat) are also recommended for sun protection.     Gentle Skin Care Guide  1. Bathe no more than once a day.  2. Avoid bathing in hot water  3. Use a mild soap like Dove, Vanicream, Cetaphil, CeraVe. Can use Lever 2000 or Cetaphil antibacterial soap  4. Use soap only where you need it. On most days, use it under your arms, between your legs, and on your feet. Let the water rinse other areas unless visibly dirty.  5. When you get  out of the bath/shower, use a towel to gently blot your skin dry, don't rub it.  6. While your skin is still a little damp, apply a moisturizing cream such as Vanicream, CeraVe, Cetaphil, Eucerin, Sarna lotion or plain Vaseline Jelly. For hands apply Neutrogena [place] Hand Cream or Excipial Hand Cream.  7. Reapply moisturizer any time you start to itch or feel dry.  8. Sometimes using free and clear laundry detergents can be helpful. Fabric softener sheets should be avoided. Downy Free & Gentle liquid, or any liquid fabric  softener that is free of dyes and perfumes, it acceptable to use  9. If your doctor has given you prescription creams you may apply moisturizers over them       Due to recent changes in healthcare laws, you may see results of your pathology and/or laboratory studies on MyChart before the doctors have had a chance to review them. We understand that in some cases there may be results that are confusing or concerning to you. Please understand that not all results are received at the same time and often the doctors may need to interpret multiple results in order to provide you with the best plan of care or course of treatment. Therefore, we ask that you please give [place] 2 business days to thoroughly review all your results before contacting the office for clarification. Should we see a critical lab result, you will be contacted sooner.   If You Need Anything After Your Visit  If you have any questions or concerns for your doctor, please call our main line at [phone] and press option 4 to reach your doctor's medical assistant. If no one answers, please leave a voicemail as directed and we will return your call as soon as possible. Messages left after 4 pm will be answered the following business day.   You may also send [place] a message via MyChart. We typically respond to MyChart messages within 1-2 business days.  For prescription refills, please ask your pharmacy to contact our office. Our fax number is [phone].  If you have an urgent issue when the clinic is closed that cannot wait until the next business day, you can page your doctor at the number below.    Please note that while we do our best to be available for urgent issues outside of office hours, we are not available 24/7.   If you have an urgent issue and are unable to reach [place], you may choose to seek medical care at your doctor's office, retail clinic, urgent care center, or emergency room.  If you have a medical emergency, please  immediately call 911 or go to the emergency department.  Pager Numbers  - Dr. Gwen Pounds: [phone]  - Dr. Roseanne Reno: [phone]  - Dr. Katrinka Blazing: [phone]   In the event of inclement weather, please call our main line at [phone] for an update on the status of any delays or closures.  Dermatology Medication Tips: Please keep the boxes that topical medications come in in order to help keep track of the instructions about where and how to use these. Pharmacies typically print the medication instructions only on the boxes and not directly on the medication tubes.   If your medication is too expensive, please contact our office at [phone] option 4 or send [place] a message through MyChart.   We are unable to tell what your co-pay for medications will be in advance as this is different depending on your insurance coverage. However, we may  be able to find a substitute medication at lower cost or fill out paperwork to get insurance to cover a needed medication.   If a prior authorization is required to get your medication covered by your insurance company, please allow [place] 1-2 business days to complete this process.  Drug prices often vary depending on where the prescription is filled and some pharmacies may offer cheaper prices.  The website www.goodrx.com contains coupons for medications through different pharmacies. The prices here do not account for what the cost may be with help from insurance (it may be cheaper with your insurance), but the website can give you the price if you did not use any insurance.  - You can print the associated coupon and take it with your prescription to the pharmacy.  - You may also stop by our office during regular business hours and pick up a GoodRx coupon card.  - If you need your prescription sent electronically to a different pharmacy, notify our office through [facility] or by phone at [phone] option 4.     Si Usted Necesita Algo  Despus de Su Visita  Tambin puede enviarnos un mensaje a travs de Clinical cytogeneticist. Por lo general respondemos a los mensajes de MyChart en el transcurso de 1 a 2 das hbiles.  Para renovar recetas, por favor pida a su farmacia que se ponga en contacto con nuestra oficina. Annie Sable de fax es [place] [phone].  Si tiene un asunto urgente cuando la clnica est cerrada y que no puede esperar hasta el siguiente da hbil, puede llamar/localizar a su doctor(a) al nmero que aparece a continuacin.   Por favor, tenga en cuenta que aunque hacemos todo lo posible para estar disponibles para asuntos urgentes fuera del horario de [place], no estamos disponibles las 24 horas del da, los 7 [address]  [address] de la [place].   Si tiene un problema urgente y no puede comunicarse con nosotros, puede optar por buscar atencin mdica  en el consultorio de su doctor(a), en una clnica privada, en un centro de atencin urgente o en una sala de emergencias.  Si tiene Engineer, drilling, por favor llame inmediatamente al 911 o vaya a la sala de emergencias.  Nmeros de bper  - Dr. Gwen Pounds: [phone]  - Dra. Roseanne Reno: [phone]  - Dr. Katrinka Blazing: [phone]   En caso de inclemencias del tiempo, por favor llame a Lacy Duverney principal al [phone] para una actualizacin sobre el [place] de cualquier retraso o cierre.  Consejos para la medicacin en dermatologa: Por favor, guarde las cajas en las que vienen los medicamentos de uso tpico para ayudarle a seguir las instrucciones sobre dnde y cmo usarlos. Las farmacias generalmente imprimen las instrucciones del medicamento slo en las cajas y no directamente en los tubos del [place].   Si su medicamento es muy caro, por favor, pngase en contacto con Rolm Gala llamando al [phone] y presione la opcin 4 o envenos un mensaje a travs de Clinical cytogeneticist.   No podemos decirle cul ser su copago por los medicamentos por adelantado ya que esto es diferente  dependiendo de la cobertura de su seguro. Sin embargo, es posible que podamos encontrar un medicamento sustituto a Audiological scientist un formulario para que el seguro cubra el medicamento que se considera necesario.   Si se requiere una autorizacin previa para que su compaa de seguros [place] su medicamento, por favor permtanos de 1 a 2 das hbiles para completar [address]  Los precios de los [employer]  varan con frecuencia dependiendo del lugar de dnde se surte la receta y alguna farmacias pueden ofrecer precios ms baratos.  El sitio web www.goodrx.com tiene cupones para medicamentos de Health and safety inspector. Los precios aqu no tienen en cuenta lo que podra costar con la ayuda del seguro (puede ser ms barato con su seguro), pero el sitio web puede darle el precio si no utiliz Tourist information centre manager.  - Puede imprimir el cupn correspondiente y llevarlo con su receta a la farmacia.  - Tambin puede pasar por nuestra oficina durante el horario de atencin regular y Education officer, museum una tarjeta de cupones de GoodRx.  - Si necesita que su receta se enve electrnicamente a una farmacia diferente, informe a nuestra oficina a travs de MyChart de [place] o por telfono llamando al [phone] y presione la opcin 4.

## 2023-11-23 ENCOUNTER — Other Ambulatory Visit: Payer: Self-pay | Admitting: Family Medicine

## 2023-11-23 MED ORDER — COLCHICINE 0.6 MG PO CAPS: 1.0000 | ORAL_CAPSULE | Freq: Every morning | ORAL | 5 refills | Status: DC

## 2023-12-19 ENCOUNTER — Telehealth: Payer: Self-pay

## 2023-12-19 NOTE — Telephone Encounter (Signed)
 Copied from CRM [id number]. Topic: General - Other >> Dec 19, 2023  1:43 PM Bearl Botts E wrote: Reason for CRM: Pt called requesting to have his colchicine  transferred to [facility] instead of [facility] because it will be much less expensive.   Wants this sent to [facility]   Colchicine  (MITIGARE ) 0.6 [facility] CAPS  [facility] - [place], [place] - [address] [place] [address] Phone: [phone] Fax: [phone]

## 2023-12-20 MED ORDER — COLCHICINE 0.6 MG PO CAPS: 1.0000 | ORAL_CAPSULE | Freq: Every day | ORAL | 1 refills | Status: DC | PRN

## 2023-12-20 NOTE — Addendum Note (Signed)
Addended by: Malva Limes on: 12/20/2023 08:20 AM   Modules accepted: Orders

## 2023-12-26 ENCOUNTER — Other Ambulatory Visit: Payer: Self-pay | Admitting: Family Medicine

## 2023-12-26 NOTE — Telephone Encounter (Signed)
Medication Refill -  Most Recent Primary Care Visit:  Provider: Malva Limes  Department: [facility]  Visit Type: PHYSICAL  Date: 05/11/2023  Medication: Colchicine (MITIGARE) 0.6 MG CAPS / pt needs tablets instead of Caps. He states Dr Sherrie Mustache has approved this  Has the patient contacted their pharmacy? yes (Agent: If yes, when and what did the pharmacy advise?)contact pcp  Is this the correct pharmacy for this prescription? yes  This is the patient's preferred pharmacy:    [facility] - [place], [place] - [address] [place] [address] Phone: [phone] Fax: [phone]   Has the prescription been filled recently? no  Is the patient out of the medication? No has  af few left  Has the patient been seen for an appointment in the last year OR does the patient have an upcoming appointment? yes  Can we respond through MyChart? yes  Agent: Please be advised that Rx refills may take up to 3 business days. We ask that you follow-up with your pharmacy.

## 2023-12-26 NOTE — Telephone Encounter (Signed)
Pt request tablets,please advise.

## 2023-12-28 MED ORDER — COLCHICINE 0.6 MG PO TABS: 0.6000 mg | ORAL_TABLET | Freq: Every day | ORAL | 3 refills | Status: DC | PRN

## 2023-12-28 NOTE — Addendum Note (Signed)
Addended by: Malva Limes on: 12/28/2023 03:06 PM   Modules accepted: Orders

## 2023-12-28 NOTE — Telephone Encounter (Signed)
Pt needing medication send in for a tablet form and not capsul.  Insurance will not cover the capsul.    Pt is running low and wants to make sure it is filled prior to running out.

## 2023-12-31 ENCOUNTER — Telehealth: Payer: Self-pay

## 2023-12-31 NOTE — Telephone Encounter (Signed)
Copied from CRM [id number]. Topic: General - Other >> Dec 28, 2023  1:21 PM Dondra Prader E wrote: Reason for CRM: Pt called reporting that he needs tablets instead of capsules, his insurance will only cover tablets.   Colchicine (MITIGARE) 0.6 MG CAPS

## 2023-12-31 NOTE — Telephone Encounter (Signed)
Has already been done.

## 2024-01-01 ENCOUNTER — Encounter: Payer: Self-pay | Admitting: Podiatry

## 2024-01-01 ENCOUNTER — Ambulatory Visit: Payer: Medicare HMO | Admitting: Podiatry

## 2024-01-01 VITALS — Ht 72.0 in | Wt 180.6 lb

## 2024-01-01 DIAGNOSIS — M79675 Pain in left toe(s): Secondary | ICD-10-CM

## 2024-01-01 DIAGNOSIS — M79674 Pain in right toe(s): Secondary | ICD-10-CM

## 2024-01-01 DIAGNOSIS — B351 Tinea unguium: Secondary | ICD-10-CM | POA: Diagnosis not present

## 2024-01-01 NOTE — Progress Notes (Signed)
  Subjective:  Patient ID: Michael Hicks, male    DOB: 1946/06/10,  MRN: [id number]  Chief Complaint  Patient presents with   Nail Problem    Pt is here for RFC   78 y.o. male returns for the above complaint.  Patient presents with thickened elongated dystrophic toenails x10.  Painful to touch.  Patient would like for me to debride down.  He is not diabetic.  He denies any other acute complaints.  Does not have any secondary complaints today  Objective:   There were no vitals filed for this visit.  Podiatric Exam: Vascular: dorsalis pedis and posterior tibial pulses are palpable bilateral. Capillary return is immediate. Temperature gradient is WNL. Skin turgor WNL  Sensorium: Normal Semmes Weinstein monofilament test. Normal tactile sensation bilaterally. Nail Exam: Pt has thick disfigured discolored nails with subungual debris noted bilateral entire nail hallux through fifth toenails.  Pain on palpation to the nails. Ulcer Exam: There is no evidence of ulcer or pre-ulcerative changes or infection. Orthopedic Exam: Muscle tone and strength are WNL. No limitations in general ROM. No crepitus or effusions noted. HAV  B/L.  Hammer toes 2-5  B/L. Skin: No Porokeratosis. No infection or ulcers.  No further dry skin noted    Assessment & Plan:   1. Pain due to onychomycosis of toenails of both feet           Patient was evaluated and treated and all questions answered.  Xerosis skin -Clinically resolving with over-the-counter medications  Onychomycosis with pain  -Nails palliatively debrided as below. -Educated on self-care  Procedure: Nail Debridement Rationale: pain  Type of Debridement: manual, sharp debridement. Instrumentation: Nail nipper, rotary burr. Number of Nails: 10  Procedures and Treatment: Consent by patient was obtained for treatment procedures. The patient understood the discussion of treatment and procedures well. All questions were answered thoroughly  reviewed. Debridement of mycotic and hypertrophic toenails, 1 through 5 bilateral and clearing of subungual debris. No ulceration, no infection noted.  Return Visit-Office Procedure: Patient instructed to return to the office for a follow up visit 3 months for continued evaluation and treatment.  Nicholes Rough, DPM    No follow-ups on file.

## 2024-01-11 ENCOUNTER — Encounter: Payer: Self-pay | Admitting: Family Medicine

## 2024-01-11 ENCOUNTER — Ambulatory Visit: Payer: Self-pay

## 2024-01-11 ENCOUNTER — Ambulatory Visit: Payer: Medicare HMO | Admitting: Family Medicine

## 2024-01-11 VITALS — BP 110/97 | HR 78 | Temp 98.6°F | Resp 20 | Ht 72.0 in | Wt 183.0 lb

## 2024-01-11 DIAGNOSIS — R058 Other specified cough: Secondary | ICD-10-CM

## 2024-01-11 DIAGNOSIS — J40 Bronchitis, not specified as acute or chronic: Secondary | ICD-10-CM | POA: Diagnosis not present

## 2024-01-11 LAB — POC COVID19/FLU A&B COMBO
Covid Antigen, POC: NEGATIVE
Influenza A Antigen, POC: NEGATIVE
Influenza B Antigen, POC: NEGATIVE

## 2024-01-11 MED ORDER — PREDNISONE 20 MG PO TABS: 40.0000 mg | ORAL_TABLET | Freq: Every day | ORAL | 0 refills | Status: DC

## 2024-01-11 MED ORDER — AMOXICILLIN 500 MG PO CAPS: 500.0000 mg | ORAL_CAPSULE | Freq: Three times a day (TID) | ORAL | 0 refills | Status: DC

## 2024-01-11 NOTE — Telephone Encounter (Signed)
 Reason for Disposition  [1] Continuous (nonstop) coughing interferes with work or school AND [2] no improvement using cough treatment per Care Advice  Answer Assessment - Initial Assessment Questions 1. ONSET: When did the cough begin?      Started Tues a week ago I was wheezing.   I'm taking Mucinex since last week.   It's not breaking up this stuff.    I sprayed Flonase in my nose last night and this morning.    It helped.   2. SEVERITY: How bad is the cough today?      Non productive  3. SPUTUM: Describe the color of your sputum (none, dry cough; clear, Ladnier, yellow, green)     Nothing coming up 4. HEMOPTYSIS: Are you coughing up any blood? If so ask: How much? (flecks, streaks, tablespoons, etc.)     Not asked 5. DIFFICULTY BREATHING: Are you having difficulty breathing? If Yes, ask: How bad is it? (e.g., mild, moderate, severe)    - MILD: No SOB at rest, mild SOB with walking, speaks normally in sentences, can lie down, no retractions, pulse < 100.    - MODERATE: SOB at rest, SOB with minimal exertion and prefers to sit, cannot lie down flat, speaks in phrases, mild retractions, audible wheezing, pulse 100-120.    - SEVERE: Very SOB at rest, speaks in single words, struggling to breathe, sitting hunched forward, retractions, pulse > 120      No shortness of breath   It's a dry hacking cough.      I'm not coughing a lot.   It's occasion.   6. FEVER: Do you have a fever? If Yes, ask: What is your temperature, how was it measured, and when did it start?     Not checked   I don't think so.    7. CARDIAC HISTORY: Do you have any history of heart disease? (e.g., heart attack, congestive heart failure)      No 8. LUNG HISTORY: Do you have any history of lung disease?  (e.g., pulmonary embolus, asthma, emphysema)     Stopped smoking in 1992 9. PE RISK FACTORS: Do you have a history of blood clots? (or: recent major surgery, recent prolonged travel, bedridden)     Not  asked 10. OTHER SYMPTOMS: Do you have any other symptoms? (e.g., runny nose, wheezing, chest pain)       Non productive cough.  I still have a very mild wheeze.   11. PREGNANCY: Is there any chance you are pregnant? When was your last menstrual period?       N/A 12. TRAVEL: Have you traveled out of the country in the last month? (e.g., travel history, exposures)       N/A  Protocols used: Cough - Acute Non-Productive-A-AH  Chief Complaint: Non productive cough, feeling congested in his chest Symptoms: mild tightness in chest,  unable to cough anything up Frequency: Tues a week ago.   Pertinent Negatives: Patient denies nasal congestion, sore throat, fever. Disposition: [] ED /[] Urgent Care (no appt availability in office) / [x] Appointment(In office/virtual)/ []  [facility]/ [] Home Care/ [] Refused Recommended Disposition /[] [facility]/ []  Follow-up with PCP Additional Notes: Appt made with Dr. Lang for today at 3:00.

## 2024-01-11 NOTE — Progress Notes (Signed)
 Established patient visit   Patient: Michael Hicks   DOB: [birth year]   78 y.o. Male  MRN: [id number] Visit Date: 01/11/2024  Today's healthcare provider: Rockie Agent, MD   Chief Complaint  Patient presents with   Cough    X 2-3 weeks, non productive, using Flonase and Guafesin with little improvement   Nasal Congestion    X 2-3 weeks   Fever    Possible fever yesterday   Subjective     HPI     Cough    Additional comments: X 2-3 weeks, non productive, using Flonase and Guafesin with little improvement        Nasal Congestion    Additional comments: X 2-3 weeks        Fever    Additional comments: Possible fever yesterday      Last edited by Anette Alan CROME, CMA on 01/11/2024  3:00 PM.       Discussed the use of AI scribe software for clinical note transcription with the patient, who gave verbal consent to proceed.  History of Present Illness   Michael Hicks is a 78 year old male who presents with a cough.  He has had a non-productive cough for over two weeks, accompanied by nasal congestion and a potential fever yesterday. He has been using Flonase and guanfacine with little improvement. No ear pain or sore throat, attributing any dryness in his mouth to sleeping with his mouth open.  He experienced wheezing last Tuesday night, which he has not had in years. The wheezing occurs at the end of deep breaths, but there is no associated shortness of breath. He has been taking mucous relief medication containing guaifenesin 400 mg since last week, but it has not been effective in producing phlegm or relieving chest congestion.  He lives alone and has not been around anyone sick, although he attended a dance at a senior center two weeks ago, which he speculates might be where he contracted his symptoms. He quit smoking 33 years ago and had a normal chest X-ray in 2020.         Past Medical History:  Diagnosis Date   Actinic keratosis     COVID-19 07/29/2020   HX   Erectile dysfunction    Gout    Hx of colonic polyps    Precancerous skin lesion    SOB (shortness of breath)     Medications: Outpatient Medications Prior to Visit  Medication Sig   colchicine  0.6 MG tablet Take 1 tablet (0.6 mg total) by mouth daily as needed (gout).   metroNIDAZOLE  (METROGEL ) 0.75 % gel Apply to face at bedtime, wash off in morning.   Multiple Vitamins-Minerals (MULTIVITAMIN WITH MINERALS) tablet Take 1 tablet by mouth daily.   triamcinolone  cream (KENALOG ) 0.1 % Apply at bedtime to affected areas on body as needed for itching. Avoid applying to face, groin, and axilla   No facility-administered medications prior to visit.    Review of Systems  Last CBC Lab Results  Component Value Date   WBC 6.4 05/11/2023   HGB 13.8 05/11/2023   HCT 41.1 05/11/2023   MCV 100 (H) 05/11/2023   MCH 33.5 (H) 05/11/2023   RDW 11.6 05/11/2023   PLT 188 05/11/2023   Last metabolic panel Lab Results  Component Value Date   GLUCOSE 88 05/11/2023   NA 141 05/11/2023   K 4.6 05/11/2023   CL 100 05/11/2023   CO2 23 05/11/2023  BUN 21 05/11/2023   CREATININE 0.87 05/11/2023   EGFR 91 05/11/2023   CALCIUM 9.6 05/11/2023   PROT 6.8 05/11/2023   ALBUMIN 4.3 05/11/2023   LABGLOB 2.5 05/11/2023   AGRATIO 1.7 05/11/2023   BILITOT 0.8 05/11/2023   ALKPHOS 105 05/11/2023   AST 23 05/11/2023   ALT 15 05/11/2023   Last lipids Lab Results  Component Value Date   CHOL 180 05/11/2023   HDL 54 05/11/2023   LDLCALC 95 05/11/2023   TRIG 180 (H) 05/11/2023   CHOLHDL 3.3 05/11/2023   Last hemoglobin A1c No results found for: HGBA1C Last vitamin D No results found for: 25OHVITD2, 25OHVITD3, VD25OH      Objective    BP (!) 110/97   Pulse 78   Temp 98.6 F (37 C)   Resp 20   Ht 6' (1.829 m)   Wt 183 lb (83 kg)   SpO2 100%   BMI 24.82 kg/m  BP Readings from Last 3 Encounters:  01/11/24 (!) 110/97  10/02/23 (!) 145/74  05/11/23  136/73   Wt Readings from Last 3 Encounters:  01/11/24 183 lb (83 kg)  01/01/24 180 lb 9.6 oz (81.9 kg)  05/11/23 180 lb 9.6 oz (81.9 kg)        Physical Exam  Physical Exam   VITALS: T- 98.6, P- 78, BP- 110/97, RR- 20, SaO2- 100 HEENT: pharynx normal, nasal mucosa normal CHEST: lungs clear to auscultation, no wheezing observed CARDIOVASCULAR: regular rate and rhythm       Results for orders placed or performed in visit on 01/11/24  POC Covid19/Flu A&B Antigen  Result Value Ref Range   Influenza A Antigen, POC Negative Negative   Influenza B Antigen, POC Negative Negative   Covid Antigen, POC Negative Negative    Assessment & Plan     Problem List Items Addressed This Visit   None Visit Diagnoses       Bronchitis    -  Primary   Relevant Medications   predniSONE  (DELTASONE ) 20 MG tablet   amoxicillin  (AMOXIL ) 500 MG capsule     Nonproductive cough       Relevant Orders   POC Covid19/Flu A&B Antigen (Completed)          Chronic Cough Persistent non-productive cough for over two weeks with associated nasal congestion and potential fever. Physical exam reveals clear lungs and no wheezing. Differential diagnosis includes upper respiratory infection, bronchitis, or post-nasal drip. Discussed prednisone  and amoxicillin  for inflammation and potential bacterial infection. Explained side effects: prednisone  (increased blood sugar, mood changes, insomnia) and amoxicillin  (gastrointestinal upset, allergic reactions). Patient consented to treatment. - Prescribe prednisone  40 mg once daily for 5 days - Prescribe amoxicillin  500 mg three times daily for 5 days - Send prescriptions to [facility]  Nasal Congestion Nasal congestion concurrent with cough. No tenderness on physical exam. Flonase has been used with little improvement. Discussed continuing Flonase for nasal inflammation. - Continue using Flonase as needed  General Health Maintenance No significant  medical history. Last chest X-ray in 2020 was normal. - Encourage follow-up if symptoms persist or worsen      No follow-ups on file.         Rockie Agent, MD  [facility] [phone] (phone) [phone] (fax)  [facility]

## 2024-01-11 NOTE — Telephone Encounter (Signed)
 Summary: Congestion, cough   The patient called in stating he has really bad congestion and a little cough. He has had it since last week. He feels better but the congestion will not break. He has tried Mucus tablets but that didn't help. Please assist patient further      Left message to call back about symptoms.

## 2024-01-15 ENCOUNTER — Telehealth: Payer: Self-pay

## 2024-01-15 NOTE — Telephone Encounter (Signed)
Call returned to patient.  Reviewed previous colonoscopy from 09/2020 with Dr. Percell Miller repeat in 5 years.  Informed patient he will be due Oct 2026. Printed a copy of the results letter for his records.  Thanks,  [place], [place]

## 2024-01-15 NOTE — Telephone Encounter (Signed)
Per Selena Batten in [place] PATIENT LEFT A MESSAGE WONDERING WHEN HE IS COMING IN FOR COLON AND I DON'T SEE ONE SCHEDULED. CAN YOU CALL HIM PLEASE THANKS [phone]

## 2024-01-17 ENCOUNTER — Other Ambulatory Visit: Payer: Self-pay | Admitting: Family Medicine

## 2024-01-17 DIAGNOSIS — J40 Bronchitis, not specified as acute or chronic: Secondary | ICD-10-CM

## 2024-01-17 NOTE — Telephone Encounter (Signed)
Requested medications are due for refill today.  unsure  Requested medications are on the active medications list.  no  Last refill. 01/11/2024  Future visit scheduled.   yes  Notes to clinic.  Please review for refill.    Requested Prescriptions  Pending Prescriptions Disp Refills   predniSONE (DELTASONE) 20 MG tablet 10 tablet 0    Sig: Take 2 tablets (40 mg total) by mouth daily with breakfast for 5 days.     Not Delegated - Endocrinology:  Oral Corticosteroids Failed - 01/17/2024  5:24 PM      Failed - This refill cannot be delegated      Failed - Manual Review: Eye exam for IOP if prolonged treatment      Failed - Glucose (serum) in normal range and within 180 days    Glucose  Date Value Ref Range Status  05/11/2023 88 70 - 99 mg/dL Final  09/81/1914 93 mg/dL Final         Failed - K in normal range and within 180 days    Potassium  Date Value Ref Range Status  05/11/2023 4.6 3.5 - 5.2 mmol/L Final         Failed - Na in normal range and within 180 days    Sodium  Date Value Ref Range Status  05/11/2023 141 134 - 144 mmol/L Final         Failed - Last BP in normal range    BP Readings from Last 1 Encounters:  01/11/24 (!) 110/97         Failed - Valid encounter within last 6 months    Recent Outpatient Visits           8 months ago Annual physical exam   [facility] Malva Limes, MD   1 year ago Annual physical exam   [facility] Malva Limes, MD   2 years ago Acute gout involving toe, unspecified cause, unspecified laterality   [place] [facility] Vigg, Avanti, MD   2 years ago Localized swelling of left foot   [place] [facility] Vigg, Avanti, MD   2 years ago Annual physical exam   [facility] Malva Limes, MD       Future Appointments             In 3 months Fisher, Demetrios Isaacs, MD [facility],  [facility]   In 10 months Willeen Niece, MD  [facility]            Failed - Bone Mineral Density or Dexa Scan completed in the last 2 years       amoxicillin (AMOXIL) 500 MG capsule 15 capsule 0    Sig: Take 1 capsule (500 mg total) by mouth 3 (three) times daily for 5 days.     Off-Protocol Failed - 01/17/2024  5:24 PM      Failed - Medication not assigned to a protocol, review manually.      Passed - Valid encounter within last 12 months    Recent Outpatient Visits           8 months ago Annual physical exam   [facility] Malva Limes, MD   1 year ago Annual physical exam   [facility] Malva Limes, MD   2 years ago Acute gout involving toe, unspecified cause, unspecified  laterality   [facility] Vigg, Avanti, MD   2 years ago Localized swelling of left foot   [place] [facility] Vigg, Avanti, MD   2 years ago Annual physical exam   [facility] Malva Limes, MD       Future Appointments             In 3 months Fisher, Demetrios Isaacs, MD [facility], [facility]   In 10 months Willeen Niece, MD [facility]

## 2024-01-17 NOTE — Telephone Encounter (Signed)
Medication Refill -  Most Recent Primary Care Visit:  Provider: Malva Limes  Department: [id number]  Visit Type: PHYSICAL  Date: 05/11/2023  Medication: predniSONE (DELTASONE) 20 MG tablet [[id number]] amoxicillin (AMOXIL) 500 MG capsule [[id number]]   Has the patient contacted their pharmacy? Yes  (Agent: If yes, when and what did the pharmacy advise?) Contact office   Is this the correct pharmacy for this prescription? Yes If no, delete pharmacy and type the correct one.  This is the patient's preferred pharmacy:  [facility] [id number] [place], [place] - [address] [place] [address] Phone: [phone] Fax: [phone]   Has the prescription been filled recently? Yes  Is the patient out of the medication? Yes  Has the patient been seen for an appointment in the last year OR does the patient have an upcoming appointment? Yes  Can we respond through MyChart? Yes  Pt is requesting additional medication because he finished his first prescription and says the medication is just beginning to break the cold up in his chest.   Agent: Please be advised that Rx refills may take up to 3 business days. We ask that you follow-up with your pharmacy.

## 2024-01-18 NOTE — Telephone Encounter (Signed)
Requested medication (s) are due for refill today: yes  Requested medication (s) are on the active medication list: yes  Last refill:  01/11/24  Future visit scheduled: yes  Notes to clinic:  Unable to refill per protocol, cannot delegate. Should patient continue to take?      Requested Prescriptions  Pending Prescriptions Disp Refills   amoxicillin (AMOXIL) 500 MG capsule [Pharmacy Med Name: AMOXICILLIN 500 MG CAPSULE] 15 capsule 0    Sig: TAKE 1 CAPSULE BY MOUTH 3 TIMES A DAY FOR 5 DAYS     Off-Protocol Failed - 01/18/2024  1:32 PM      Failed - Medication not assigned to a protocol, review manually.      Passed - Valid encounter within last 12 months    Recent Outpatient Visits           8 months ago Annual physical exam   [facility] Malva Limes, MD   1 year ago Annual physical exam   [facility] Malva Limes, MD   2 years ago Acute gout involving toe, unspecified cause, unspecified laterality   [place] [facility] Vigg, Avanti, MD   2 years ago Localized swelling of left foot   [place] [facility] Vigg, Avanti, MD   2 years ago Annual physical exam   [place] [facility] Malva Limes, MD       Future Appointments             In 3 months Fisher, Demetrios Isaacs, MD [facility]   In 10 months Willeen Niece, MD [facility]             predniSONE (DELTASONE) 20 MG tablet [Pharmacy Med Name: predniSONE 20 MG TABLET] 10 tablet 0    Sig: TAKE 2 TABLETS BY MOUTH DAILY WITH BREAKFAST FOR 5 DAYS     Not Delegated - Endocrinology:  Oral Corticosteroids Failed - 01/18/2024  1:32 PM      Failed - This refill cannot be delegated      Failed - Manual Review: Eye exam for IOP if prolonged treatment      Failed - Glucose (serum) in normal range and within 180 days    Glucose  Date Value Ref Range Status   05/11/2023 88 70 - 99 mg/dL Final  16/09/9603 93 mg/dL Final         Failed - K in normal range and within 180 days    Potassium  Date Value Ref Range Status  05/11/2023 4.6 3.5 - 5.2 mmol/L Final         Failed - Na in normal range and within 180 days    Sodium  Date Value Ref Range Status  05/11/2023 141 134 - 144 mmol/L Final         Failed - Last BP in normal range    BP Readings from Last 1 Encounters:  01/11/24 (!) 110/97         Failed - Valid encounter within last 6 months    Recent Outpatient Visits           8 months ago Annual physical exam   [facility] Malva Limes, MD   1 year ago Annual physical exam   [facility] Malva Limes, MD   2 years ago Acute gout involving toe, unspecified cause, unspecified laterality  [facility] Vigg, Avanti, MD   2 years ago Localized swelling of left foot   [place] [facility] Vigg, Avanti, MD   2 years ago Annual physical exam   [facility] Malva Limes, MD       Future Appointments             In 3 months Fisher, Demetrios Isaacs, MD [facility]   In 10 months Willeen Niece, MD [facility]            Failed - Bone Mineral Density or Dexa Scan completed in the last 2 years

## 2024-01-21 MED ORDER — PREDNISONE 20 MG PO TABS: 40.0000 mg | ORAL_TABLET | Freq: Every day | ORAL | 0 refills | Status: AC

## 2024-01-21 MED ORDER — AMOXICILLIN 500 MG PO CAPS: 500.0000 mg | ORAL_CAPSULE | Freq: Three times a day (TID) | ORAL | 0 refills | Status: AC

## 2024-01-31 NOTE — Telephone Encounter (Unsigned)
 Copied from CRM [id number]. Topic: General - Billing Inquiry >> Jan 31, 2024  8:57 AM Gildardo Pounds wrote: Reason for CRM: Patient received a denial letter from [facility] for visit on 01/11/2024 because patient saw another provider when Dr Sherrie Mustache was unavailable for an appointment. Dr Sherrie Mustache needs to approve the visit so that the insurance will cover the visit. Callback number is [phone]

## 2024-03-06 DIAGNOSIS — H04123 Dry eye syndrome of bilateral lacrimal glands: Secondary | ICD-10-CM | POA: Diagnosis not present

## 2024-03-06 DIAGNOSIS — H43813 Vitreous degeneration, bilateral: Secondary | ICD-10-CM | POA: Diagnosis not present

## 2024-03-06 DIAGNOSIS — H2513 Age-related nuclear cataract, bilateral: Secondary | ICD-10-CM | POA: Diagnosis not present

## 2024-03-06 DIAGNOSIS — H43821 Vitreomacular adhesion, right eye: Secondary | ICD-10-CM | POA: Diagnosis not present

## 2024-03-25 DIAGNOSIS — H2511 Age-related nuclear cataract, right eye: Secondary | ICD-10-CM | POA: Diagnosis not present

## 2024-04-01 ENCOUNTER — Ambulatory Visit: Payer: Medicare HMO | Admitting: Podiatry

## 2024-04-01 ENCOUNTER — Ambulatory Visit: Admitting: Podiatry

## 2024-04-01 DIAGNOSIS — M79675 Pain in left toe(s): Secondary | ICD-10-CM

## 2024-04-01 DIAGNOSIS — B351 Tinea unguium: Secondary | ICD-10-CM

## 2024-04-01 DIAGNOSIS — M79674 Pain in right toe(s): Secondary | ICD-10-CM | POA: Diagnosis not present

## 2024-04-01 NOTE — Progress Notes (Signed)
  Subjective:  Patient ID: Michael Hicks, male    DOB: 05-Jan-1946,  MRN: [id number]  Chief Complaint  Patient presents with   Nail Problem    Nail trim    78 y.o. male returns for the above complaint.  Patient presents with thickened elongated dystrophic toenails x10.  Painful to touch.  Patient would like for me to debride down.  He is not diabetic.  He denies any other acute complaints.  Does not have any secondary complaints today  Objective:   There were no vitals filed for this visit.  Podiatric Exam: Vascular: dorsalis pedis and posterior tibial pulses are palpable bilateral. Capillary return is immediate. Temperature gradient is WNL. Skin turgor WNL  Sensorium: Normal Semmes Weinstein monofilament test. Normal tactile sensation bilaterally. Nail Exam: Pt has thick disfigured discolored nails with subungual debris noted bilateral entire nail hallux through fifth toenails.  Pain on palpation to the nails. Ulcer Exam: There is no evidence of ulcer or pre-ulcerative changes or infection. Orthopedic Exam: Muscle tone and strength are WNL. No limitations in general ROM. No crepitus or effusions noted. HAV  B/L.  Hammer toes 2-5  B/L. Skin: No Porokeratosis. No infection or ulcers.  No further dry skin noted    Assessment & Plan:   No diagnosis found.         Patient was evaluated and treated and all questions answered.  Xerosis skin -Clinically resolving with over-the-counter medications  Onychomycosis with pain  -Nails palliatively debrided as below. -Educated on self-care  Procedure: Nail Debridement Rationale: pain  Type of Debridement: manual, sharp debridement. Instrumentation: Nail nipper, rotary burr. Number of Nails: 10  Procedures and Treatment: Consent by patient was obtained for treatment procedures. The patient understood the discussion of treatment and procedures well. All questions were answered thoroughly reviewed. Debridement of mycotic and  hypertrophic toenails, 1 through 5 bilateral and clearing of subungual debris. No ulceration, no infection noted.  Return Visit-Office Procedure: Patient instructed to return to the office for a follow up visit 3 months for continued evaluation and treatment.  Tinnie Forehand, DPM    No follow-ups on file.

## 2024-04-02 ENCOUNTER — Encounter: Payer: Self-pay | Admitting: Ophthalmology

## 2024-04-07 NOTE — Discharge Instructions (Signed)

## 2024-04-09 ENCOUNTER — Other Ambulatory Visit: Payer: Self-pay

## 2024-04-09 ENCOUNTER — Encounter: Payer: Self-pay | Admitting: Ophthalmology

## 2024-04-09 ENCOUNTER — Ambulatory Visit
Admission: RE | Admit: 2024-04-09 | Discharge: 2024-04-09 | Disposition: A | Discharge status: Home / Self Care | Attending: Ophthalmology | Admitting: Ophthalmology

## 2024-04-09 ENCOUNTER — Encounter
Admission: RE | Disposition: A | Discharge status: Home / Self Care | Payer: Self-pay | Source: Home / Self Care | Attending: Ophthalmology

## 2024-04-09 ENCOUNTER — Ambulatory Visit: Payer: Self-pay | Admitting: Anesthesiology

## 2024-04-09 DIAGNOSIS — H2512 Age-related nuclear cataract, left eye: Secondary | ICD-10-CM | POA: Diagnosis not present

## 2024-04-09 DIAGNOSIS — Z87891 Personal history of nicotine dependence: Secondary | ICD-10-CM | POA: Diagnosis not present

## 2024-04-09 DIAGNOSIS — Z79899 Other long term (current) drug therapy: Secondary | ICD-10-CM | POA: Insufficient documentation

## 2024-04-09 DIAGNOSIS — N529 Male erectile dysfunction, unspecified: Secondary | ICD-10-CM | POA: Diagnosis not present

## 2024-04-09 HISTORY — PX: CATARACT EXTRACTION W/PHACO: SHX586

## 2024-04-09 SURGERY — PHACOEMULSIFICATION, CATARACT, WITH IOL INSERTION
Anesthesia: Monitor Anesthesia Care | Site: Eye | Laterality: Left

## 2024-04-09 MED ORDER — SIGHTPATH DOSE#1 BSS IO SOLN
INTRAOCULAR | Status: DC | PRN
  Administered 2024-04-09: 56 mL via OPHTHALMIC

## 2024-04-09 MED ORDER — MIDAZOLAM HCL 2 MG/2ML IJ SOLN
INTRAMUSCULAR | Status: AC
  Filled 2024-04-09: qty 2

## 2024-04-09 MED ORDER — BRIMONIDINE TARTRATE-TIMOLOL 0.2-0.5 % OP SOLN
OPHTHALMIC | Status: DC | PRN
  Administered 2024-04-09: 1 [drp] via OPHTHALMIC

## 2024-04-09 MED ORDER — TETRACAINE HCL 0.5 % OP SOLN
OPHTHALMIC | Status: AC
  Filled 2024-04-09: qty 4

## 2024-04-09 MED ORDER — ARMC OPHTHALMIC DILATING DROPS
OPHTHALMIC | Status: AC
  Filled 2024-04-09: qty 0.5

## 2024-04-09 MED ORDER — MIDAZOLAM HCL 2 MG/2ML IJ SOLN
INTRAMUSCULAR | Status: DC | PRN
  Administered 2024-04-09 (×2): 1 mg via INTRAVENOUS

## 2024-04-09 MED ORDER — FENTANYL CITRATE (PF) 100 MCG/2ML IJ SOLN
INTRAMUSCULAR | Status: DC | PRN
  Administered 2024-04-09: 50 ug via INTRAVENOUS

## 2024-04-09 MED ORDER — SIGHTPATH DOSE#1 BSS IO SOLN
INTRAOCULAR | Status: DC | PRN
  Administered 2024-04-09: 15 mL via INTRAOCULAR

## 2024-04-09 MED ORDER — CEFUROXIME OPHTHALMIC INJECTION 1 MG/0.1 ML
INJECTION | OPHTHALMIC | Status: DC | PRN
  Administered 2024-04-09: 1 mg via INTRACAMERAL

## 2024-04-09 MED ORDER — FENTANYL CITRATE (PF) 100 MCG/2ML IJ SOLN
INTRAMUSCULAR | Status: AC
  Filled 2024-04-09: qty 2

## 2024-04-09 MED ORDER — ARMC OPHTHALMIC DILATING DROPS
1.0000 | OPHTHALMIC | Status: DC | PRN
  Administered 2024-04-09 (×3): 1 via OPHTHALMIC

## 2024-04-09 MED ORDER — LIDOCAINE HCL (PF) 2 % IJ SOLN
INTRAOCULAR | Status: DC | PRN
  Administered 2024-04-09: 2 mL

## 2024-04-09 MED ORDER — TETRACAINE HCL 0.5 % OP SOLN
1.0000 [drp] | OPHTHALMIC | Status: DC | PRN
  Administered 2024-04-09 (×3): 1 [drp] via OPHTHALMIC

## 2024-04-09 MED ORDER — SIGHTPATH DOSE#1 NA HYALUR & NA CHOND-NA HYALUR IO KIT
PACK | INTRAOCULAR | Status: DC | PRN
  Administered 2024-04-09: 1 via OPHTHALMIC

## 2024-04-09 SURGICAL SUPPLY — 10 items
CATARACT SUITE SIGHTPATH (MISCELLANEOUS) ×1 IMPLANT
FEE CATARACT SUITE SIGHTPATH (MISCELLANEOUS) ×1 IMPLANT
GLOVE BIOGEL PI IND STRL 8 (GLOVE) ×1 IMPLANT
GLOVE SURG LX STRL 7.5 STRW (GLOVE) ×1 IMPLANT
GLOVE SURG PROTEXIS BL SZ6.5 (GLOVE) ×1 IMPLANT
GLOVE SURG SYN 6.5 PF PI BL (GLOVE) ×1 IMPLANT
LENS IOL TECNIS EYHANCE 19.5 (Intraocular Lens) IMPLANT
NDL FILTER BLUNT 18X1 1/2 (NEEDLE) ×1 IMPLANT
NEEDLE FILTER BLUNT 18X1 1/2 (NEEDLE) ×1 IMPLANT
SYR 3ML LL SCALE MARK (SYRINGE) ×1 IMPLANT

## 2024-04-09 NOTE — Anesthesia Preprocedure Evaluation (Signed)
 Anesthesia Evaluation  Patient identified by MRN, date of birth, ID band Patient awake    Reviewed: Allergy & Precautions, H&P , NPO status , Patient's Chart, lab work & pertinent test results, reviewed documented beta blocker date and time   History of Anesthesia Complications Negative for: history of anesthetic complications  Airway Mallampati: II  TM Distance: >3 FB Neck ROM: full    Dental  (+) Dental Advidsory Given, Caps, Teeth Intact   Pulmonary neg pulmonary ROS, former smoker   Pulmonary exam normal breath sounds clear to auscultation       Cardiovascular Exercise Tolerance: Good negative cardio ROS Normal cardiovascular exam Rhythm:regular Rate:Normal     Neuro/Psych negative neurological ROS  negative psych ROS   GI/Hepatic negative GI ROS, Neg liver ROS,,,  Endo/Other  negative endocrine ROS    Renal/GU negative Renal ROS  negative genitourinary   Musculoskeletal   Abdominal   Peds  Hematology negative hematology ROS (+)   Anesthesia Other Findings Past Medical History: No date: Actinic keratosis 07/29/2020: COVID-19     Comment:  HX No date: Erectile dysfunction No date: Gout No date: Hx of colonic polyps No date: Precancerous skin lesion No date: SOB (shortness of breath)   Reproductive/Obstetrics negative OB ROS                             Anesthesia Physical Anesthesia Plan  ASA: 2  Anesthesia Plan: MAC   Post-op Pain Management:    Induction: Intravenous  PONV Risk Score and Plan: 1 and Midazolam and Treatment may vary due to age or medical condition  Airway Management Planned: Natural Airway and Nasal Cannula  Additional Equipment:   Intra-op Plan:   Post-operative Plan:   Informed Consent: I have reviewed the patients History and Physical, chart, labs and discussed the procedure including the risks, benefits and alternatives for the proposed  anesthesia with the patient or authorized representative who has indicated his/her understanding and acceptance.     Dental Advisory Given  Plan Discussed with: Anesthesiologist, CRNA and Surgeon  Anesthesia Plan Comments:        Anesthesia Quick Evaluation

## 2024-04-09 NOTE — H&P (Signed)
 [facility]   Primary Care Physician:  Lamon Pillow, MD Ophthalmologist: Dr. Annell Kidney  Pre-Procedure History & Physical: HPI:  Michael Hicks is a 78 y.o. male here for ophthalmic surgery.   Past Medical History:  Diagnosis Date   Actinic keratosis    COVID-19 07/29/2020   HX   Erectile dysfunction    Gout    Hx of colonic polyps    Precancerous skin lesion    SOB (shortness of breath)     Past Surgical History:  Procedure Laterality Date   COLONOSCOPY     COLONOSCOPY WITH PROPOFOL  N/A 01/14/2016   Procedure: COLONOSCOPY WITH PROPOFOL ;  Surgeon: Marnee Sink, MD;  Location: [facility];  Service: Endoscopy;  Laterality: N/A;   COLONOSCOPY WITH PROPOFOL  N/A 07/26/2017   Procedure: COLONOSCOPY WITH PROPOFOL ;  Surgeon: Marnee Sink, MD;  Location: [facility];  Service: Gastroenterology;  Laterality: N/A;   COLONOSCOPY WITH PROPOFOL  N/A 09/10/2020   Procedure: COLONOSCOPY WITH PROPOFOL ;  Surgeon: Marnee Sink, MD;  Location: [facility];  Service: Endoscopy;  Laterality: N/A;  priority 4 COVID (+) 07/28/20   POLYPECTOMY  01/14/2016   Procedure: POLYPECTOMY;  Surgeon: Marnee Sink, MD;  Location: [facility];  Service: Endoscopy;;   POLYPECTOMY  09/10/2020   Procedure: POLYPECTOMY;  Surgeon: Marnee Sink, MD;  Location: [facility];  Service: Endoscopy;;    Prior to Admission medications   Medication Sig Start Date End Date Taking? Authorizing Provider  colchicine  0.6 MG tablet Take 1 tablet (0.6 mg total) by mouth daily as needed (gout). 12/28/23  Yes Lamon Pillow, MD  Multiple Vitamins-Minerals (MULTIVITAMIN WITH MINERALS) tablet Take 1 tablet by mouth daily.   Yes [provider]  metroNIDAZOLE  (METROGEL ) 0.75 % gel Apply to face at bedtime, wash off in morning. Patient not taking: Reported on 04/02/2024 11/13/23   Artemio Larry, MD  triamcinolone  cream (KENALOG ) 0.1 % Apply at bedtime to affected areas on  body as needed for itching. Avoid applying to face, groin, and axilla Patient not taking: Reported on 04/02/2024 11/13/23   Artemio Larry, MD    Allergies as of 03/10/2024   (No Known Allergies)    Family History  Problem Relation Age of Onset   COPD Mother    Heart disease Mother    Hypertension Father    Cancer Brother        bone cancer    Social History   Socioeconomic History   Marital status: Divorced    Spouse name: Not on file   Number of children: 1   Years of education: 14   Highest education level: Associate degree: occupational, Scientist, product/process development, or vocational program  Occupational History   Occupation: Finance    Comment: RETIRED  Tobacco Use   Smoking status: Former    Current packs/day: 0.00    Average packs/day: 1 pack/day for 15.0 years (15.0 ttl pk-yrs)    Types: Cigarettes    Start date: 12/05/1975    Quit date: 12/04/1990    Years since quitting: 33.3   Smokeless tobacco: Never  Vaping Use   Vaping status: Never Used  Substance and Sexual Activity   Alcohol use: Yes    Alcohol/week: 14.0 - 21.0 standard drinks of alcohol    Types: 14 - 21 Cans of beer per week    Comment: 2-3 beers a night   Drug use: No   Sexual activity: Yes  Other Topics Concern   Not on file  Social  History Narrative   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    Social Drivers of Corporate investment banker Strain: Low Risk  (05/01/2023)   Overall Financial Resource Strain (CARDIA)    Difficulty of Paying Living Expenses: Not hard at all  Food Insecurity: No Food Insecurity (01/10/2022)   Hunger Vital Sign    Worried About Running Out of Food in the Last Year: Never true    Ran Out of Food in the Last Year: Never true  Transportation Needs: No Transportation Needs (05/01/2023)   PRAPARE - Administrator, Civil Service (Medical): No    Lack of Transportation (Non-Medical): No  Physical Activity: Sufficiently Active (05/01/2023)   Exercise Vital Sign    Days of  Exercise per Week: 5 days    Minutes of Exercise per Session: 40 min  Stress: No Stress Concern Present (05/01/2023)   [employer] of Occupational Health - Occupational Stress Questionnaire    Feeling of Stress : Not at all  Social Connections: Moderately Integrated (05/01/2023)   Social Connection and Isolation Panel [NHANES]    Frequency of Communication with Friends and Family: More than three times a week    Frequency of Social Gatherings with Friends and Family: More than three times a week    Attends Religious Services: More than 4 times per year    Active Member of [employer] or Organizations: Yes    Attends Engineer, structural: More than 4 times per year    Marital Status: Divorced  Intimate Partner Violence: Not At Risk (05/01/2023)   Humiliation, Afraid, Rape, and Kick questionnaire    Fear of Current or Ex-Partner: No    Emotionally Abused: No    Physically Abused: No    Sexually Abused: No    Review of Systems: See HPI, otherwise negative ROS  Physical Exam: BP 122/80   Pulse 62   Temp (!) 97.2 F (36.2 C) (Temporal)   Resp 11   Ht 6' (1.829 m)   Wt 80.3 kg   SpO2 98%   BMI 24.01 kg/m  General:   Alert,  pleasant and cooperative in NAD Head:  Normocephalic and atraumatic. Lungs:  Clear to auscultation.    Heart:  Regular rate and rhythm.   Impression/Plan: Michael Hicks is here for ophthalmic surgery.  Risks, benefits, limitations, and alternatives regarding ophthalmic surgery have been reviewed with the patient.  Questions have been answered.  All parties agreeable.   Annell Kidney, MD  04/09/2024, 7:33 AM

## 2024-04-09 NOTE — Op Note (Signed)
 OPERATIVE NOTE  [name] [id number] 04/09/2024   PREOPERATIVE DIAGNOSIS:  Nuclear sclerotic cataract left eye. H25.12   POSTOPERATIVE DIAGNOSIS:    Nuclear sclerotic cataract left eye.     PROCEDURE:  Phacoemusification with posterior chamber intraocular lens placement of the left eye  Ultrasound time: Procedure(s): PHACOEMULSIFICATION, CATARACT, WITH IOL INSERTION 6.53 00:50.0 (Left)  LENS:   Implant Name Type Inv. Item Serial No. Manufacturer Lot No. LRB No. Used Action  LENS IOL TECNIS EYHANCE 19.5 - [id number] Intraocular Lens LENS IOL TECNIS EYHANCE 19.5 [id number] SIGHT[employer]  Left 1 Implanted      SURGEON:  Berline Brenner, MD   ANESTHESIA:  Topical with tetracaine drops and 2% Xylocaine  jelly, augmented with 1% preservative-free intracameral lidocaine .    COMPLICATIONS:  None.   DESCRIPTION OF PROCEDURE:  The patient was identified in the holding room and transported to the operating room and placed in the supine position under the operating microscope.  The left eye was identified as the operative eye and it was prepped and draped in the usual sterile ophthalmic fashion.   A 1 millimeter clear-corneal paracentesis was made at the 1:30 position.  0.5 ml of preservative-free 1% lidocaine  was injected into the anterior chamber.  The anterior chamber was filled with Viscoat viscoelastic.  A 2.4 millimeter keratome was used to make a near-clear corneal incision at the 10:30 position.  .  A curvilinear capsulorrhexis was made with a cystotome and capsulorrhexis forceps.  Balanced salt solution was used to hydrodissect and hydrodelineate the nucleus.   Phacoemulsification was then used in stop and chop fashion to remove the lens nucleus and epinucleus.  The remaining cortex was then removed using the irrigation and aspiration handpiece. Provisc was then placed into the capsular bag to distend it for lens placement.  A lens was then injected into the capsular bag.  The  remaining viscoelastic was aspirated.   Wounds were hydrated with balanced salt solution.  The anterior chamber was inflated to a physiologic pressure with balanced salt solution.  No wound leaks were noted. Cefuroxime 0.1 ml of a 10mg /ml solution was injected into the anterior chamber for a dose of 1 mg of intracameral antibiotic at the completion of the case.   Timolol and Brimonidine drops were applied to the eye.  The patient was taken to the recovery room in stable condition without complications of anesthesia or surgery.  [name] 04/09/2024, 8:39 AM

## 2024-04-09 NOTE — Transfer of Care (Signed)
 Immediate Anesthesia Transfer of Care Note  Patient: Michael Hicks  Procedure(s) Performed: PHACOEMULSIFICATION, CATARACT, WITH IOL INSERTION 6.53 00:50.0 (Left: Eye)  Patient Location: PACU  Anesthesia Type: MAC  Level of Consciousness: awake, alert  and patient cooperative  Airway and Oxygen Therapy: Patient Spontanous Breathing and Patient connected to supplemental oxygen  Post-op Assessment: Post-op Vital signs reviewed, Patient's Cardiovascular Status Stable, Respiratory Function Stable, Patent Airway and No signs of Nausea or vomiting  Post-op Vital Signs: Reviewed and stable  Complications: No notable events documented.

## 2024-04-09 NOTE — Anesthesia Postprocedure Evaluation (Signed)
 Anesthesia Post Note  Patient: Michael Hicks  Procedure(s) Performed: PHACOEMULSIFICATION, CATARACT, WITH IOL INSERTION 6.53 00:50.0 (Left: Eye)  Patient location during evaluation: PACU Anesthesia Type: MAC Level of consciousness: awake and alert Pain management: pain level controlled Vital Signs Assessment: post-procedure vital signs reviewed and stable Respiratory status: spontaneous breathing, nonlabored ventilation, respiratory function stable and patient connected to nasal cannula oxygen Cardiovascular status: stable and blood pressure returned to baseline Postop Assessment: no apparent nausea or vomiting Anesthetic complications: no   No notable events documented.   Last Vitals:  Vitals:   04/09/24 0840 04/09/24 0843  BP: 106/76 106/75  Pulse: 61 62  Resp: 14 13  Temp: (!) 36.2 C (!) 36.2 C  SpO2: 100% 97%    Last Pain:  Vitals:   04/09/24 0843  TempSrc:   PainSc: 0-No pain                 Vanice Genre

## 2024-04-23 ENCOUNTER — Ambulatory Visit: Admit: 2024-04-23 | Admitting: Ophthalmology

## 2024-04-23 SURGERY — PHACOEMULSIFICATION, CATARACT, WITH IOL INSERTION
Anesthesia: Topical | Laterality: Right

## 2024-04-29 DIAGNOSIS — M7042 Prepatellar bursitis, left knee: Secondary | ICD-10-CM | POA: Diagnosis not present

## 2024-05-07 ENCOUNTER — Ambulatory Visit: Payer: Medicare HMO

## 2024-05-07 DIAGNOSIS — Z0000 Encounter for general adult medical examination without abnormal findings: Secondary | ICD-10-CM | POA: Diagnosis not present

## 2024-05-07 NOTE — Progress Notes (Signed)
 Subjective:   Michael Hicks is a 78 y.o. who presents for a Medicare Wellness preventive visit.  As a reminder, Annual Wellness Visits don't include a physical exam, and some assessments may be limited, especially if this visit is performed virtually. We may recommend an in-person follow-up visit with your provider if needed.  Visit Complete: Virtual I connected with  [name] on 05/07/24 by a audio enabled telemedicine application and verified that I am speaking with the correct person using two identifiers.  Patient Location: Home  Provider Location: Home Office  I discussed the limitations of evaluation and management by telemedicine. The patient expressed understanding and agreed to proceed.  Vital Signs: Because this visit was a virtual/telehealth visit, some criteria may be missing or patient reported. Any vitals not documented were not able to be obtained and vitals that have been documented are patient reported.  VideoDeclined- This patient declined Librarian, academic. Therefore the visit was completed with audio only.  Persons Participating in Visit: Patient.  AWV Questionnaire: No: Patient Medicare AWV questionnaire was not completed prior to this visit.  Cardiac Risk Factors include: advanced age (>55men, >78 women);male gender     Objective:     There were no vitals filed for this visit. There is no height or weight on file to calculate BMI.     05/07/2024    8:57 AM 04/09/2024    7:15 AM 05/01/2023    9:05 AM 01/10/2022    9:45 AM 01/04/2021   10:30 AM 09/10/2020    8:09 AM 12/31/2019   10:30 AM  Advanced Directives  Does Patient Have a Medical Advance Directive? No No No No No Yes Yes  Type of Engineer, drilling of [place];Living will  Does patient want to make changes to medical advance directive?      No - Patient declined   Copy of Healthcare Power of Attorney in Chart?       Yes - validated most recent copy scanned in chart (See row information) No - copy requested  Would patient like information on creating a medical advance directive? No - Patient declined Yes (MAU/Ambulatory/Procedural Areas - Information given)  No - Patient declined Yes (ED - Information included in AVS) No - Patient declined     Current Medications (verified) Outpatient Encounter Medications as of 05/07/2024  Medication Sig   colchicine  0.6 MG tablet Take 1 tablet (0.6 mg total) by mouth daily as needed (gout).   Multiple Vitamins-Minerals (MULTIVITAMIN WITH MINERALS) tablet Take 1 tablet by mouth daily.   triamcinolone  cream (KENALOG ) 0.1 % Apply at bedtime to affected areas on body as needed for itching. Avoid applying to face, groin, and axilla   metroNIDAZOLE  (METROGEL ) 0.75 % gel Apply to face at bedtime, wash off in morning. (Patient not taking: Reported on 04/02/2024)   No facility-administered encounter medications on file as of 05/07/2024.    Allergies (verified) Patient has no known allergies.   History: Past Medical History:  Diagnosis Date   Actinic keratosis    COVID-19 07/29/2020   HX   Erectile dysfunction    Gout    Hx of colonic polyps    Precancerous skin lesion    SOB (shortness of breath)    Past Surgical History:  Procedure Laterality Date   CATARACT EXTRACTION W/PHACO Left 04/09/2024   Procedure: PHACOEMULSIFICATION, CATARACT, WITH IOL INSERTION 6.53 00:50.0;  Surgeon: Annell Kidney, MD;  Location: [facility];  Service: Ophthalmology;  Laterality: Left;   COLONOSCOPY     COLONOSCOPY WITH PROPOFOL  N/A 01/14/2016   Procedure: COLONOSCOPY WITH PROPOFOL ;  Surgeon: Marnee Sink, MD;  Location: [facility];  Service: Endoscopy;  Laterality: N/A;   COLONOSCOPY WITH PROPOFOL  N/A 07/26/2017   Procedure: COLONOSCOPY WITH PROPOFOL ;  Surgeon: Marnee Sink, MD;  Location: [facility];  Service: Gastroenterology;  Laterality: N/A;   COLONOSCOPY  WITH PROPOFOL  N/A 09/10/2020   Procedure: COLONOSCOPY WITH PROPOFOL ;  Surgeon: Marnee Sink, MD;  Location: [facility];  Service: Endoscopy;  Laterality: N/A;  priority 4 COVID (+) 07/28/20   POLYPECTOMY  01/14/2016   Procedure: POLYPECTOMY;  Surgeon: Marnee Sink, MD;  Location: [facility];  Service: Endoscopy;;   POLYPECTOMY  09/10/2020   Procedure: POLYPECTOMY;  Surgeon: Marnee Sink, MD;  Location: [facility];  Service: Endoscopy;;   Family History  Problem Relation Age of Onset   COPD Mother    Heart disease Mother    Hypertension Father    Cancer Brother        bone cancer   Social History   Socioeconomic History   Marital status: Divorced    Spouse name: Not on file   Number of children: 1   Years of education: 14   Highest education level: Associate degree: occupational, Scientist, product/process development, or vocational program  Occupational History   Occupation: Finance    Comment: RETIRED  Tobacco Use   Smoking status: Former    Current packs/day: 0.00    Average packs/day: 1 pack/day for 15.0 years (15.0 ttl pk-yrs)    Types: Cigarettes    Start date: 12/05/1975    Quit date: 12/04/1990    Years since quitting: 33.4   Smokeless tobacco: Never  Vaping Use   Vaping status: Never Used  Substance and Sexual Activity   Alcohol use: Yes    Alcohol/week: 14.0 - 21.0 standard drinks of alcohol    Types: 14 - 21 Cans of beer per week    Comment: 2-3 beers a night   Drug use: No   Sexual activity: Yes  Other Topics Concern   Not on file  Social History Narrative   [place]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    Social Drivers of Corporate investment banker Strain: Low Risk  (05/07/2024)   Overall Financial Resource Strain (CARDIA)    Difficulty of Paying Living Expenses: Not hard at all  Food Insecurity: No Food Insecurity (05/07/2024)   Hunger Vital Sign    Worried About Running Out of Food in the Last Year: Never true    Ran Out of Food in the Last Year: Never true   Transportation Needs: No Transportation Needs (05/07/2024)   PRAPARE - Administrator, Civil Service (Medical): No    Lack of Transportation (Non-Medical): No  Physical Activity: Sufficiently Active (05/07/2024)   Exercise Vital Sign    Days of Exercise per Week: 7 days    Minutes of Exercise per Session: 40 min  Stress: No Stress Concern Present (05/07/2024)   [employer] of Occupational Health - Occupational Stress Questionnaire    Feeling of Stress : Not at all  Social Connections: Moderately Integrated (05/07/2024)   Social Connection and Isolation Panel [NHANES]    Frequency of Communication with Friends and Family: More than three times a week    Frequency of Social Gatherings with Friends and Family: Three times a week    Attends Religious  Services: More than 4 times per year    Active Member of Clubs or Organizations: Yes    Attends Banker Meetings: More than 4 times per year    Marital Status: Divorced    Tobacco Counseling Counseling given: Not Answered    Clinical Intake:  Pre-visit preparation completed: Yes  Pain : No/denies pain     BMI - recorded: 24.8 Nutritional Status: BMI of 19-24  Normal Nutritional Risks: None Diabetes: No  No results found for: "HGBA1C"   How often do you need to have someone help you when you read instructions, pamphlets, or other written materials from your doctor or pharmacy?: 1 - Never  Interpreter Needed?: No  Information entered by :: Dellie Fergusson, LPN   Activities of Daily Living    05/07/2024    8:59 AM 04/09/2024    7:18 AM  In your present state of health, do you have any difficulty performing the following activities:  Hearing? 0 0  Vision? 0 0  Difficulty concentrating or making decisions? 0 0  Walking or climbing stairs? 0   Dressing or bathing? 0   Doing errands, shopping? 0   Preparing Food and eating ? N   Using the Toilet? N   In the past six months, have you accidently leaked  urine? N   Do you have problems with loss of bowel control? N   Managing your Medications? N   Managing your Finances? N   Housekeeping or managing your Housekeeping? N     Patient Care Team: Lamon Pillow, MD as PCP - General (Family Medicine) Reche Canales, OD as Consulting Physician (Optometry) Artemio Larry, MD (Dermatology) Marnee Sink, MD as Consulting Physician (Gastroenterology)  I have updated your Care Teams any recent Medical Services you may have received from other providers in the past year.     Assessment:    This is a routine wellness examination for Gonzalo.  Hearing/Vision screen Hearing Screening - Comments:: NO AIDS Vision Screening - Comments:: HAD CATARACT SGY LAST MONTH- NO GLASSES- DR.NICE   Goals Addressed             This Visit's Progress    DIET - REDUCE SUGAR INTAKE         Depression Screen     05/07/2024    8:55 AM 05/11/2023   10:01 AM 05/01/2023    9:02 AM 05/03/2022   10:53 AM 01/10/2022    9:43 AM 05/24/2021    9:40 AM 05/18/2021    1:33 PM  PHQ 2/9 Scores  PHQ - 2 Score 0 0 0 0 0 0 0  PHQ- 9 Score 0 0  1       Fall Risk     05/07/2024    8:59 AM 05/11/2023   10:01 AM 05/01/2023    8:57 AM 05/03/2022   10:53 AM 01/10/2022    9:46 AM  Fall Risk   Falls in the past year? 0 0 0 0 0  Number falls in past yr: 0 0 0 0 0  Injury with Fall? 0 0 0 0 0  Risk for fall due to : No Fall Risks No Fall Risks No Fall Risks No Fall Risks No Fall Risks  Follow up Falls evaluation completed  Falls prevention discussed;Education provided Falls evaluation completed Falls evaluation completed    MEDICARE RISK AT HOME:  Medicare Risk at Home Any stairs in or around the home?: Yes If so, are there any without handrails?:  No Home free of loose throw rugs in walkways, pet beds, electrical cords, etc?: Yes Adequate lighting in your home to reduce risk of falls?: Yes Life alert?: No Use of a cane, walker or w/c?: No Grab bars in the bathroom?:  No Shower chair or bench in shower?: No Elevated toilet seat or a handicapped toilet?: No  TIMED UP AND GO:  Was the test performed?  No  Cognitive Function: 6CIT completed        05/07/2024    9:01 AM 05/01/2023    9:11 AM 12/27/2018    9:41 AM 12/17/2017    1:41 PM  6CIT Screen  What Year? 0 points 0 points 0 points 0 points  What month? 0 points 0 points 0 points 0 points  What time? 0 points 0 points 0 points 0 points  Count back from 20 0 points 0 points 0 points 0 points  Months in reverse 0 points 0 points 0 points 0 points  Repeat phrase 0 points 0 points 0 points 4 points  Total Score 0 points 0 points 0 points 4 points    Immunizations Immunization History  Administered Date(s) Administered   Influenza, High Dose Seasonal PF 10/20/2016, 09/28/2017, 09/03/2018, 09/11/2020, 08/26/2021, 08/12/2022   Influenza,inj,Quad PF,6+ Mos 12/01/2015   Influenza-Unspecified 09/28/2017, 09/05/2019   PFIZER(Purple Top)SARS-COV-2 Vaccination 01/27/2020, 02/17/2020, 09/13/2020   Pfizer Covid-19 Vaccine Bivalent Booster 43yrs & up 08/31/2021   Pneumococcal Conjugate-13 11/24/2013   Pneumococcal Polysaccharide-23 11/25/2014   Rsv, Bivalent, Protein Subunit Rsvpref,pf (Abrysvo) 09/07/2022   Td 11/22/2012   Tdap 11/22/2012   Zoster Recombinant(Shingrix) 09/05/2019, 06/11/2020   Zoster, Live 11/22/2012    Screening Tests Health Maintenance  Topic Date Due   DTaP/Tdap/Td (3 - Td or Tdap) 11/22/2022   COVID-19 Vaccine (5 - 2024-25 season) 08/05/2023   INFLUENZA VACCINE  07/04/2024   Medicare Annual Wellness (AWV)  05/07/2025   Colonoscopy  09/10/2025   Pneumonia Vaccine 81+ Years old  Completed   Hepatitis C Screening  Completed   Zoster Vaccines- Shingrix  Completed   HPV VACCINES  Aged Out   Meningococcal B Vaccine  Aged Out    Health Maintenance  Health Maintenance Due  Topic Date Due   DTaP/Tdap/Td (3 - Td or Tdap) 11/22/2022   COVID-19 Vaccine (5 - 2024-25 season)  08/05/2023   Health Maintenance Items Addressed: UP TO DATE W/ COLONOSCOPY; UP TO DATE ON SHINGRIX; NEEDS TDAP & PNA- WANTS NO MORE COVIDS  Additional Screening:  Vision Screening: Recommended annual ophthalmology exams for early detection of glaucoma and other disorders of the eye. Would you like a referral to an eye doctor? No    Dental Screening: Recommended annual dental exams for proper oral hygiene  Community Resource Referral / Chronic Care Management: CRR required this visit?  No   CCM required this visit?  No   Plan:    I have personally reviewed and noted the following in the patient's chart:   Medical and social history Use of alcohol, tobacco or illicit drugs  Current medications and supplements including opioid prescriptions. Patient is not currently taking opioid prescriptions. Functional ability and status Nutritional status Physical activity Advanced directives List of other physicians Hospitalizations, surgeries, and ER visits in previous 12 months Vitals Screenings to include cognitive, depression, and falls Referrals and appointments  In addition, I have reviewed and discussed with patient certain preventive protocols, quality metrics, and best practice recommendations. A written personalized care plan for preventive services as well as general  preventive health recommendations were provided to patient.   Pinky Bright, LPN   01/04/3085   After Visit Summary: (MyChart) Due to this being a telephonic visit, the after visit summary with patients personalized plan was offered to patient via MyChart   Notes: Nothing significant to report at this time.

## 2024-05-07 NOTE — Patient Instructions (Addendum)
 [name] , Thank you for taking time out of your busy schedule to complete your Annual Wellness Visit with me. I enjoyed our conversation and look forward to speaking with you again next year. I, as well as your care team,  appreciate your ongoing commitment to your health goals. Please review the following plan we discussed and let me know if I can assist you in the future.  Follow up Visits: Next Medicare AWV with our clinical staff:   05/19/25 @ 8:10 AM BY PHONE Have you seen your provider in the last 6 months (3 months if uncontrolled diabetes)? Yes  Clinician Recommendations:  Aim for 30 minutes of exercise or brisk walking, 6-8 glasses of water , and 5 servings of fruits and vegetables each day. TAKE CARE!      This is a list of the screening recommended for you and due dates:  Health Maintenance  Topic Date Due   DTaP/Tdap/Td vaccine (3 - Td or Tdap) 11/22/2022   COVID-19 Vaccine (5 - 2024-25 season) 08/05/2023   Flu Shot  07/04/2024   Medicare Annual Wellness Visit  05/07/2025   Colon Cancer Screening  09/10/2025   Pneumonia Vaccine  Completed   Hepatitis C Screening  Completed   Zoster (Shingles) Vaccine  Completed   HPV Vaccine  Aged Out   Meningitis B Vaccine  Aged Out    Advanced directives: (ACP Link)Information on Advanced Care Planning can be found at [place]  Secretary of [facility] Advance Health Care Directives Advance Health Care Directives. [email or web address]  Advance Care Planning is important because it:  [x]  Makes sure you receive the medical care that is consistent with your values, goals, and preferences  [x]  It provides guidance to your family and loved ones and reduces their decisional burden about whether or not they are making the right decisions based on your wishes.  Follow the link provided in your after visit summary or read over the paperwork we have mailed to you to help you started getting your Advance Directives in place. If you need assistance in completing  these, please reach out to us  so that we can help you!

## 2024-05-08 ENCOUNTER — Telehealth: Payer: Self-pay

## 2024-05-08 NOTE — Telephone Encounter (Signed)
 Copied from CRM [id number]. Topic: General - Other >> May 08, 2024  1:56 PM Santiya F wrote: Reason for CRM: Michael Hicks is calling in because patient's claim for an office visit on 01/11/24 is being denied because they need the referral from the provider. They said they requested it on 04/27/24 but they haven't received anything. [employer] said these are the codes. [phone]. Fax: [phone].

## 2024-05-12 ENCOUNTER — Ambulatory Visit (INDEPENDENT_AMBULATORY_CARE_PROVIDER_SITE_OTHER): Payer: Self-pay | Admitting: Family Medicine

## 2024-05-12 ENCOUNTER — Encounter: Payer: Self-pay | Admitting: Family Medicine

## 2024-05-12 VITALS — BP 126/81 | HR 60 | Temp 97.7°F | Ht 72.0 in | Wt 181.9 lb

## 2024-05-12 DIAGNOSIS — H6123 Impacted cerumen, bilateral: Secondary | ICD-10-CM

## 2024-05-12 DIAGNOSIS — Z0000 Encounter for general adult medical examination without abnormal findings: Secondary | ICD-10-CM

## 2024-05-12 DIAGNOSIS — Z125 Encounter for screening for malignant neoplasm of prostate: Secondary | ICD-10-CM

## 2024-05-12 NOTE — Progress Notes (Signed)
 Complete physical exam   Patient: Michael Hicks   DOB: September 07, 1946   78 y.o. Male  MRN: [id number] Visit Date: 05/12/2024  Today's healthcare provider: Jeralene Mom, MD   Chief Complaint  Patient presents with   Annual Exam    05/07/2024 AWV done   Subjective    Discussed the use of AI scribe software for clinical note transcription with the patient, who gave verbal consent to proceed.  History of Present Illness   Michael Hicks is a 78 year old male who presents for an annual physical exam.  He underwent cataract surgery on his left eye on May 7th. Prior to the surgery, he experienced 'fuzzy' vision and diplopia in both eyes, with the right eye being better. Post-surgery, his vision returned to 20/20, and he has no pain. He might need glasses for hyperopia, but currently, his vision is satisfactory. He has decided to delay surgery on the right eye as it is not significantly affecting his vision.  About three weeks ago, he experienced inflammation in his knee, which he attributes to his history of working on his knees for over thirty years. The knee swelled significantly, resembling a 'red pepper'. He sought care at an orthopedic walk-in clinic, where x-rays were unremarkable. He managed the inflammation with ice packs and Aleve, which he takes once in the morning. The knee has since returned to normal, and he reports no pain or functional limitations.  He maintains an active lifestyle, including walking and dancing on weekends. He experiences cerumen buildup once or twice a year, which he manages by washing it out with a syringe and warm water . He consumes beer, typically two to three a day.  No chest pain, heart flutter, or shortness of breath. No issues with hearing or tinnitus, aside from occasional cerumen buildup.       Past Medical History:  Diagnosis Date   Actinic keratosis    COVID-19 07/29/2020   HX   Erectile dysfunction    Gout    Hx of colonic polyps     Precancerous skin lesion    SOB (shortness of breath)    Past Surgical History:  Procedure Laterality Date   CATARACT EXTRACTION W/PHACO Left 04/09/2024   Procedure: PHACOEMULSIFICATION, CATARACT, WITH IOL INSERTION 6.53 00:50.0;  Surgeon: Annell Kidney, MD;  Location: [facility];  Service: Ophthalmology;  Laterality: Left;   COLONOSCOPY     COLONOSCOPY WITH PROPOFOL  N/A 01/14/2016   Procedure: COLONOSCOPY WITH PROPOFOL ;  Surgeon: Marnee Sink, MD;  Location: [facility];  Service: Endoscopy;  Laterality: N/A;   COLONOSCOPY WITH PROPOFOL  N/A 07/26/2017   Procedure: COLONOSCOPY WITH PROPOFOL ;  Surgeon: Marnee Sink, MD;  Location: [facility];  Service: Gastroenterology;  Laterality: N/A;   COLONOSCOPY WITH PROPOFOL  N/A 09/10/2020   Procedure: COLONOSCOPY WITH PROPOFOL ;  Surgeon: Marnee Sink, MD;  Location: [facility];  Service: Endoscopy;  Laterality: N/A;  priority 4 COVID (+) 07/28/20   POLYPECTOMY  01/14/2016   Procedure: POLYPECTOMY;  Surgeon: Marnee Sink, MD;  Location: [facility];  Service: Endoscopy;;   POLYPECTOMY  09/10/2020   Procedure: POLYPECTOMY;  Surgeon: Marnee Sink, MD;  Location: [facility];  Service: Endoscopy;;   Social History   Socioeconomic History   Marital status: Divorced    Spouse name: Not on file   Number of children: 1   Years of education: 14   Highest education level: Associate degree: occupational, Scientist, product/process development, or vocational program  Occupational History  Occupation: Finance    Comment: RETIRED  Tobacco Use   Smoking status: Former    Current packs/day: 0.00    Average packs/day: 1 pack/day for 15.0 years (15.0 ttl pk-yrs)    Types: Cigarettes    Start date: 12/05/1975    Quit date: 12/04/1990    Years since quitting: 33.4   Smokeless tobacco: Never  Vaping Use   Vaping status: Never Used  Substance and Sexual Activity   Alcohol use: Yes    Alcohol/week: 14.0 - 21.0 standard drinks of alcohol     Types: 14 - 21 Cans of beer per week    Comment: 2-3 beers a night   Drug use: No   Sexual activity: Yes  Other Topics Concern   Not on file  Social History Narrative   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    Social Drivers of Corporate investment banker Strain: Low Risk  (05/07/2024)   Overall Financial Resource Strain (CARDIA)    Difficulty of Paying Living Expenses: Not hard at all  Food Insecurity: No Food Insecurity (05/07/2024)   Hunger Vital Sign    Worried About Running Out of Food in the Last Year: Never true    Ran Out of Food in the Last Year: Never true  Transportation Needs: No Transportation Needs (05/07/2024)   PRAPARE - Administrator, Civil Service (Medical): No    Lack of Transportation (Non-Medical): No  Physical Activity: Sufficiently Active (05/07/2024)   Exercise Vital Sign    Days of Exercise per Week: 7 days    Minutes of Exercise per Session: 40 min  Stress: No Stress Concern Present (05/07/2024)   [employer] of Occupational Health - Occupational Stress Questionnaire    Feeling of Stress : Not at all  Social Connections: Moderately Integrated (05/07/2024)   Social Connection and Isolation Panel [NHANES]    Frequency of Communication with Friends and Family: More than three times a week    Frequency of Social Gatherings with Friends and Family: Three times a week    Attends Religious Services: More than 4 times per year    Active Member of Clubs or Organizations: Yes    Attends Banker Meetings: More than 4 times per year    Marital Status: Divorced  Intimate Partner Violence: Not At Risk (05/07/2024)   Humiliation, Afraid, Rape, and Kick questionnaire    Fear of Current or Ex-Partner: No    Emotionally Abused: No    Physically Abused: No    Sexually Abused: No   Family Status  Relation Name Status   Mother  Deceased   Father  Deceased       cerebrovascular accident   Brother  Deceased   Brother  Alive   Brother   Alive  No partnership data on file   Family History  Problem Relation Age of Onset   COPD Mother    Heart disease Mother    Hypertension Father    Cancer Brother        bone cancer   No Known Allergies  Patient Care Team: Lamon Pillow, MD as PCP - General (Family Medicine) Reche Canales, OD as Consulting Physician (Optometry) Artemio Larry, MD (Dermatology) Marnee Sink, MD as Consulting Physician (Gastroenterology)   Medications: Outpatient Medications Prior to Visit  Medication Sig   colchicine  0.6 MG tablet Take 1 tablet (0.6 mg total) by mouth daily as needed (gout).   Multiple Vitamins-Minerals (MULTIVITAMIN WITH MINERALS) tablet  Take 1 tablet by mouth daily.   triamcinolone  cream (KENALOG ) 0.1 % Apply at bedtime to affected areas on body as needed for itching. Avoid applying to face, groin, and axilla   metroNIDAZOLE  (METROGEL ) 0.75 % gel Apply to face at bedtime, wash off in morning. (Patient not taking: Reported on 05/12/2024)   No facility-administered medications prior to visit.    Review of Systems  Constitutional:  Negative for appetite change, chills and fever.  Respiratory:  Negative for chest tightness, shortness of breath and wheezing.   Cardiovascular:  Negative for chest pain and palpitations.  Gastrointestinal:  Negative for abdominal pain, nausea and vomiting.      Objective    BP 126/81 (BP Location: Left Arm, Patient Position: Sitting, Cuff Size: Normal)   Pulse 60   Temp 97.7 F (36.5 C) (Oral)   Ht 6' (1.829 m)   Wt 181 lb 14.4 oz (82.5 kg)   SpO2 100%   BMI 24.67 kg/m    Physical Exam   General Appearance:    Well developed, well nourished male. Alert, cooperative, in no acute distress, appears stated age  Head:    Normocephalic, without obvious abnormality, atraumatic  Eyes:    PERRL, conjunctiva/corneas clear, EOM's intact, fundi    benign, both eyes       Ears:    Normal TM's and external ear canals, both ears  Nose:   Nares  normal, septum midline, mucosa normal, no drainage   or sinus tenderness  Throat:   Lips, mucosa, and tongue normal; teeth and gums normal  Neck:   Supple, symmetrical, trachea midline, no adenopathy;       thyroid :  No enlargement/tenderness/nodules; no carotid   bruit or JVD  Back:     Symmetric, no curvature, ROM normal, no CVA tenderness  Lungs:     Clear to auscultation bilaterally, respirations unlabored  Chest wall:    No tenderness or deformity  Heart:    Normal heart rate. Normal rhythm. No murmurs, rubs, or gallops.  S1 and S2 normal  Abdomen:     Soft, non-tender, bowel sounds active all four quadrants,    no masses, no organomegaly  Genitalia:    deferred  Rectal:    deferred  Extremities:   All extremities are intact. No cyanosis or edema  Pulses:   2+ and symmetric all extremities  Skin:   Skin color, texture, turgor normal, no rashes or lesions  Lymph nodes:   Cervical, supraclavicular, and axillary nodes normal  Neurologic:   CNII-XII intact. Normal strength, sensation and reflexes      throughout     Last depression screening scores    05/07/2024    8:55 AM 05/11/2023   10:01 AM 05/01/2023    9:02 AM  PHQ 2/9 Scores  PHQ - 2 Score 0 0 0  PHQ- 9 Score 0 0    Last fall risk screening    05/07/2024    8:59 AM  Fall Risk   Falls in the past year? 0  Number falls in past yr: 0  Injury with Fall? 0  Risk for fall due to : No Fall Risks  Follow up Falls evaluation completed   Last Audit-C alcohol use screening    05/07/2024    8:53 AM  Alcohol Use Disorder Test (AUDIT)  1. How often do you have a drink containing alcohol? 4  2. How many drinks containing alcohol do you have on a typical day when you are drinking?  0  3. How often do you have six or more drinks on one occasion? 0  AUDIT-C Score 4  4. How often during the last year have you found that you were not able to stop drinking once you had started? 0  5. How often during the last year have you failed to do  what was normally expected from you because of drinking? 0  6. How often during the last year have you needed a first drink in the morning to get yourself going after a heavy drinking session? 0  7. How often during the last year have you had a feeling of guilt of remorse after drinking? 0  8. How often during the last year have you been unable to remember what happened the night before because you had been drinking? 0  9. Have you or someone else been injured as a result of your drinking? 0  10. Has a relative or friend or a doctor or another health worker been concerned about your drinking or suggested you cut down? 0  Alcohol Use Disorder Identification Test Final Score (AUDIT) 4   A score of 3 or more in women, and 4 or more in men indicates increased risk for alcohol abuse, EXCEPT if all of the points are from question 1   No results found for any visits on 05/12/24.  Assessment & Plan    Routine Health Maintenance and Physical Exam  Exercise Activities and Dietary recommendations  Goals      DIET - EAT MORE FRUITS AND VEGETABLES     DIET - INCREASE WATER  INTAKE     Recommend to drink at least 6-8 8oz glasses of water  per day.      DIET - REDUCE SUGAR INTAKE        Immunization History  Administered Date(s) Administered   Influenza, High Dose Seasonal PF 10/20/2016, 09/28/2017, 09/03/2018, 09/11/2020, 08/26/2021, 08/12/2022   Influenza,inj,Quad PF,6+ Mos 12/01/2015   Influenza-Unspecified 09/28/2017, 09/05/2019   PFIZER(Purple Top)SARS-COV-2 Vaccination 01/27/2020, 02/17/2020, 09/13/2020   Pfizer Covid-19 Vaccine Bivalent Booster 38yrs & up 08/31/2021   Pneumococcal Conjugate-13 11/24/2013   Pneumococcal Polysaccharide-23 11/25/2014   Rsv, Bivalent, Protein Subunit Rsvpref,pf Pattricia Bores) 09/07/2022   Td 11/22/2012   Tdap 11/22/2012   Zoster Recombinant(Shingrix) 09/05/2019, 06/11/2020   Zoster, Live 11/22/2012    Health Maintenance  Topic Date Due   DTaP/Tdap/Td (3 -  Td or Tdap) 11/22/2022   COVID-19 Vaccine (7 - Pfizer risk 2024-25 season) 09/03/2024 (Originally 02/06/2024)   INFLUENZA VACCINE  07/04/2024   Medicare Annual Wellness (AWV)  05/07/2025   Colonoscopy  09/10/2025   Pneumonia Vaccine 58+ Years old  Completed   Hepatitis C Screening  Completed   Zoster Vaccines- Shingrix  Completed   HPV VACCINES  Aged Out   Meningococcal B Vaccine  Aged Out    Discussed health benefits of physical activity, and encouraged him to engage in regular exercise appropriate for his age and condition.  Advised he is due for Tdap which he can get from his pharmacy.    - CBC - Comprehensive metabolic panel with GFR - Lipid panel  2. Prostate cancer screening  - PSA Total (Reflex To Free)   3. Excessive cerumen in both ear canals He states he has home ear wash irrigator which is effective.         Jeralene Mom, MD  [facility] [phone] (phone) [phone] (fax)  [facility]

## 2024-05-12 NOTE — Patient Instructions (Addendum)
 Please review the attached list of medications and notify my office if there are any errors.   You are due for a Tdap (tetanus-diptheria-pertussis vaccine) which protects you from tetanus and whooping cough. Please check with your insurance plan or pharmacy regarding coverage for this vaccine.   Limit alcohol  consumption

## 2024-05-13 ENCOUNTER — Ambulatory Visit: Payer: Self-pay

## 2024-05-13 DIAGNOSIS — Z0100 Encounter for examination of eyes and vision without abnormal findings: Secondary | ICD-10-CM | POA: Diagnosis not present

## 2024-05-13 LAB — LIPID PANEL
Chol/HDL Ratio: 3.3 ratio (ref 0.0–5.0)
Cholesterol, Total: 160 mg/dL (ref 100–199)
HDL: 48 mg/dL (ref >39)
LDL Chol Calc (NIH): 81 mg/dL (ref 0–99)
Triglycerides: 185 mg/dL — ABNORMAL HIGH (ref 0–149)
VLDL Cholesterol Cal: 31 mg/dL (ref 5–40)

## 2024-05-13 LAB — CBC
Hematocrit: 40.5 % (ref 37.5–51.0)
Hemoglobin: 14 g/dL (ref 13.0–17.7)
MCH: 34.7 pg — ABNORMAL HIGH (ref 26.6–33.0)
MCHC: 34.6 g/dL (ref 31.5–35.7)
MCV: 100 fL — ABNORMAL HIGH (ref 79–97)
Platelets: 168 10*3/uL (ref 150–450)
RBC: 4.04 x10E6/uL — ABNORMAL LOW (ref 4.14–5.80)
RDW: 11.6 % (ref 11.6–15.4)
WBC: 5.4 10*3/uL (ref 3.4–10.8)

## 2024-05-13 LAB — COMPREHENSIVE METABOLIC PANEL WITH GFR
ALT: 15 IU/L (ref 0–44)
AST: 26 IU/L (ref 0–40)
Albumin: 4.3 g/dL (ref 3.8–4.8)
Alkaline Phosphatase: 103 IU/L (ref 44–121)
BUN/Creatinine Ratio: 18 (ref 10–24)
BUN: 17 mg/dL (ref 8–27)
Bilirubin Total: 0.6 mg/dL (ref 0.0–1.2)
CO2: 22 mmol/L (ref 20–29)
Calcium: 9.3 mg/dL (ref 8.6–10.2)
Chloride: 102 mmol/L (ref 96–106)
Creatinine, Ser: 0.93 mg/dL (ref 0.76–1.27)
Globulin, Total: 2.4 g/dL (ref 1.5–4.5)
Glucose: 94 mg/dL (ref 70–99)
Potassium: 4.2 mmol/L (ref 3.5–5.2)
Sodium: 139 mmol/L (ref 134–144)
Total Protein: 6.7 g/dL (ref 6.0–8.5)
eGFR: 86 mL/min/{1.73_m2} (ref >59)

## 2024-05-13 LAB — PSA TOTAL (REFLEX TO FREE): Prostate Specific Ag, Serum: 1.5 ng/mL (ref 0.0–4.0)

## 2024-05-13 NOTE — Telephone Encounter (Signed)
 FYI please see the triage message

## 2024-05-13 NOTE — Telephone Encounter (Signed)
 FYI Only or Action Required?: FYI only for provider  Patient was last seen in primary care on 05/12/2024 by Lamon Pillow, MD. Called Nurse Triage reporting Medication Question. Symptoms began yesterday. Interventions attempted: Rest, hydration, or home remedies. Symptoms are: stable.  Triage Disposition: Information or Advice Only Call  Patient/caregiver understands and will follow disposition?: Yes      Copied from CRM [id number]. Topic: Clinical - Medication Question >> May 13, 2024 11:08 AM Chrystal Crape R wrote: Pt calling to ask if tiredness is a side effect from his tetanus shot he received yesterday. Reason for Disposition  Health Information question, no triage required and triager able to answer question  Answer Assessment - Initial Assessment Questions 1. REASON FOR CALL or QUESTION: "What is your reason for calling today?" or "How can I best help you?" or "What question do you have that I can help answer?"     Patient states he got a tetanus shot done yesterday at [facility] and is feeling tired and wants to know if that is normal. This RN informed patient that it is normal, but to call back if he develops symptoms of high fever or allergic reaction. Patient verbalized understanding.  Protocols used: Information Only Call - No Triage-A-AH

## 2024-05-19 ENCOUNTER — Ambulatory Visit: Payer: Self-pay | Admitting: Family Medicine

## 2024-07-01 ENCOUNTER — Ambulatory Visit: Admitting: Podiatry

## 2024-07-01 DIAGNOSIS — B351 Tinea unguium: Secondary | ICD-10-CM | POA: Diagnosis not present

## 2024-07-01 DIAGNOSIS — M79674 Pain in right toe(s): Secondary | ICD-10-CM | POA: Diagnosis not present

## 2024-07-01 DIAGNOSIS — M79675 Pain in left toe(s): Secondary | ICD-10-CM

## 2024-07-01 NOTE — Progress Notes (Signed)
  Subjective:  Patient ID: Michael Hicks, male    DOB: 05-Jan-1946,  MRN: [id number]  Chief Complaint  Patient presents with   Nail Problem    Nail trim    78 y.o. male returns for the above complaint.  Patient presents with thickened elongated dystrophic toenails x10.  Painful to touch.  Patient would like for me to debride down.  He is not diabetic.  He denies any other acute complaints.  Does not have any secondary complaints today  Objective:   There were no vitals filed for this visit.  Podiatric Exam: Vascular: dorsalis pedis and posterior tibial pulses are palpable bilateral. Capillary return is immediate. Temperature gradient is WNL. Skin turgor WNL  Sensorium: Normal Semmes Weinstein monofilament test. Normal tactile sensation bilaterally. Nail Exam: Pt has thick disfigured discolored nails with subungual debris noted bilateral entire nail hallux through fifth toenails.  Pain on palpation to the nails. Ulcer Exam: There is no evidence of ulcer or pre-ulcerative changes or infection. Orthopedic Exam: Muscle tone and strength are WNL. No limitations in general ROM. No crepitus or effusions noted. HAV  B/L.  Hammer toes 2-5  B/L. Skin: No Porokeratosis. No infection or ulcers.  No further dry skin noted    Assessment & Plan:   No diagnosis found.         Patient was evaluated and treated and all questions answered.  Xerosis skin -Clinically resolving with over-the-counter medications  Onychomycosis with pain  -Nails palliatively debrided as below. -Educated on self-care  Procedure: Nail Debridement Rationale: pain  Type of Debridement: manual, sharp debridement. Instrumentation: Nail nipper, rotary burr. Number of Nails: 10  Procedures and Treatment: Consent by patient was obtained for treatment procedures. The patient understood the discussion of treatment and procedures well. All questions were answered thoroughly reviewed. Debridement of mycotic and  hypertrophic toenails, 1 through 5 bilateral and clearing of subungual debris. No ulceration, no infection noted.  Return Visit-Office Procedure: Patient instructed to return to the office for a follow up visit 3 months for continued evaluation and treatment.  Tinnie Forehand, DPM    No follow-ups on file.

## 2024-08-06 ENCOUNTER — Telehealth: Payer: Self-pay | Admitting: Family Medicine

## 2024-08-06 NOTE — Telephone Encounter (Signed)
 Form being sent back for signature from [employer] Fax to number on form when completed

## 2024-08-07 NOTE — Telephone Encounter (Signed)
 done

## 2024-10-02 ENCOUNTER — Ambulatory Visit: Admitting: Podiatry

## 2024-10-02 DIAGNOSIS — M79674 Pain in right toe(s): Secondary | ICD-10-CM

## 2024-10-02 DIAGNOSIS — B351 Tinea unguium: Secondary | ICD-10-CM | POA: Diagnosis not present

## 2024-10-02 DIAGNOSIS — M79675 Pain in left toe(s): Secondary | ICD-10-CM

## 2024-10-02 NOTE — Progress Notes (Signed)
  Subjective:  Patient ID: Michael Hicks, male    DOB: 18-Apr-1946,  MRN: [id number]  Chief Complaint  Patient presents with   Nail Problem    Nail trim    78 y.o. male returns for the above complaint.  Patient presents with thickened elongated dystrophic toenails x10.  Painful to touch.  Patient would like for me to debride down.  He is not diabetic.  He denies any other acute complaints.  Does not have any secondary complaints today  Objective:   There were no vitals filed for this visit.  Podiatric Exam: Vascular: dorsalis pedis and posterior tibial pulses are palpable bilateral. Capillary return is immediate. Temperature gradient is WNL. Skin turgor WNL  Sensorium: Normal Semmes Weinstein monofilament test. Normal tactile sensation bilaterally. Nail Exam: Pt has thick disfigured discolored nails with subungual debris noted bilateral entire nail hallux through fifth toenails.  Pain on palpation to the nails. Ulcer Exam: There is no evidence of ulcer or pre-ulcerative changes or infection. Orthopedic Exam: Muscle tone and strength are WNL. No limitations in general ROM. No crepitus or effusions noted. HAV  B/L.  Hammer toes 2-5  B/L. Skin: No Porokeratosis. No infection or ulcers.  No further dry skin noted    Assessment & Plan:   1. Pain due to onychomycosis of toenails of both feet            Patient was evaluated and treated and all questions answered.  Xerosis skin -Clinically resolving with over-the-counter medications  Onychomycosis with pain  -Nails palliatively debrided as below. -Educated on self-care  Procedure: Nail Debridement Rationale: pain  Type of Debridement: manual, sharp debridement. Instrumentation: Nail nipper, rotary burr. Number of Nails: 10  Procedures and Treatment: Consent by patient was obtained for treatment procedures. The patient understood the discussion of treatment and procedures well. All questions were answered thoroughly  reviewed. Debridement of mycotic and hypertrophic toenails, 1 through 5 bilateral and clearing of subungual debris. No ulceration, no infection noted.  Return Visit-Office Procedure: Patient instructed to return to the office for a follow up visit 3 months for continued evaluation and treatment.  Franky Blanch, DPM    No follow-ups on file.

## 2024-10-15 ENCOUNTER — Other Ambulatory Visit: Payer: Self-pay | Admitting: Family Medicine

## 2024-11-17 ENCOUNTER — Ambulatory Visit: Payer: Medicare HMO | Admitting: Dermatology

## 2024-11-17 ENCOUNTER — Encounter: Payer: Self-pay | Admitting: Dermatology

## 2024-11-17 DIAGNOSIS — L821 Other seborrheic keratosis: Secondary | ICD-10-CM

## 2024-11-17 DIAGNOSIS — D224 Melanocytic nevi of scalp and neck: Secondary | ICD-10-CM

## 2024-11-17 DIAGNOSIS — W908XXA Exposure to other nonionizing radiation, initial encounter: Secondary | ICD-10-CM | POA: Diagnosis not present

## 2024-11-17 DIAGNOSIS — L578 Other skin changes due to chronic exposure to nonionizing radiation: Secondary | ICD-10-CM | POA: Diagnosis not present

## 2024-11-17 DIAGNOSIS — L820 Inflamed seborrheic keratosis: Secondary | ICD-10-CM | POA: Diagnosis not present

## 2024-11-17 DIAGNOSIS — D1801 Hemangioma of skin and subcutaneous tissue: Secondary | ICD-10-CM

## 2024-11-17 DIAGNOSIS — L719 Rosacea, unspecified: Secondary | ICD-10-CM

## 2024-11-17 DIAGNOSIS — L570 Actinic keratosis: Secondary | ICD-10-CM

## 2024-11-17 DIAGNOSIS — Z7189 Other specified counseling: Secondary | ICD-10-CM

## 2024-11-17 DIAGNOSIS — L814 Other melanin hyperpigmentation: Secondary | ICD-10-CM | POA: Diagnosis not present

## 2024-11-17 DIAGNOSIS — D229 Melanocytic nevi, unspecified: Secondary | ICD-10-CM

## 2024-11-17 DIAGNOSIS — R238 Other skin changes: Secondary | ICD-10-CM | POA: Diagnosis not present

## 2024-11-17 DIAGNOSIS — Z1283 Encounter for screening for malignant neoplasm of skin: Secondary | ICD-10-CM | POA: Diagnosis not present

## 2024-11-17 DIAGNOSIS — D692 Other nonthrombocytopenic purpura: Secondary | ICD-10-CM | POA: Diagnosis not present

## 2024-11-17 NOTE — Progress Notes (Signed)
 Follow-Up Visit   Subjective  Michael Hicks is a 78 y.o. male who presents for the following: Skin Cancer Screening and Upper Body Skin Exam hx of Aks, Rosacea face, never started Metronidazole  0.75% gel, check spot L dorsum hand hx of picking at it and then it fell off  The patient presents for Upper Body Skin Exam (UBSE) for skin cancer screening and mole check. The patient has spots, moles and lesions to be evaluated, some may be new or changing and the patient may have concern these could be cancer.    The following portions of the chart were reviewed this encounter and updated as appropriate: medications, allergies, medical history  Review of Systems:  No other skin or systemic complaints except as noted in HPI or Assessment and Plan.  Objective  Well appearing patient in no apparent distress; mood and affect are within normal limits.  All skin waist up examined. Relevant physical exam findings are noted in the Assessment and Plan.  R forearm x 1 Residual stuck on waxy papule with erythema (semi-circle) mid forehead x 2, R ear helix x 1, R antehelix x 1 (4) Pink scaly macules  Assessment & Plan   INFLAMED SEBORRHEIC KERATOSIS R forearm x 1 Symptomatic, irritating, patient would like treated. - Destruction of lesion - R forearm x 1  Destruction method: cryotherapy   Informed consent: discussed and consent obtained   Lesion destroyed using liquid nitrogen: Yes   Region frozen until ice ball extended beyond lesion: Yes   Outcome: patient tolerated procedure well with no complications   Post-procedure details: wound care instructions given   Additional details:  Prior to procedure, discussed risks of blister formation, small wound, skin dyspigmentation, or rare scar following cryotherapy. Recommend Vaseline ointment to treated areas while healing.   AK (ACTINIC KERATOSIS) (4) mid forehead x 2, R ear helix x 1, R antehelix x 1 (4) Actinic keratoses are precancerous  spots that appear secondary to cumulative UV radiation exposure/sun exposure over time. They are chronic with expected duration over 1 year. A portion of actinic keratoses will progress to squamous cell carcinoma of the skin. It is not possible to reliably predict which spots will progress to skin cancer and so treatment is recommended to prevent development of skin cancer.  Recommend daily broad spectrum sunscreen SPF 30+ to sun-exposed areas, reapply every 2 hours as needed.  Recommend staying in the shade or wearing long sleeves, sun glasses (UVA+UVB protection) and wide brim hats (4-inch brim around the entire circumference of the hat). Call for new or changing lesions. - Destruction of lesion - mid forehead x 2, R ear helix x 1, R antehelix x 1 (4)  Destruction method: cryotherapy   Informed consent: discussed and consent obtained   Lesion destroyed using liquid nitrogen: Yes   Region frozen until ice ball extended beyond lesion: Yes   Outcome: patient tolerated procedure well with no complications   Post-procedure details: wound care instructions given   Additional details:  Prior to procedure, discussed risks of blister formation, small wound, skin dyspigmentation, or rare scar following cryotherapy. Recommend Vaseline ointment to treated areas while healing.   Skin cancer screening performed today.  ACTINIC DAMAGE WITH PRECANCEROUS ACTINIC KERATOSES Counseling for Topical Chemotherapy Management: Patient exhibits: - Severe, confluent actinic changes with pre-cancerous actinic keratoses that is secondary to cumulative UV radiation exposure over time - Condition that is severe; chronic, not at goal. - diffuse scaly erythematous macules and papules with underlying dyspigmentation -  Discussed Prescription Field Treatment topical Chemotherapy for Severe, Chronic Confluent Actinic Changes with Pre-Cancerous Actinic Keratoses Field treatment involves treatment of an entire area of skin  that has confluent Actinic Changes (Sun/ Ultraviolet light damage) and PreCancerous Actinic Keratoses by method of PhotoDynamic Therapy (PDT) and/or prescription Topical Chemotherapy agents such as 5-fluorouracil, 5-fluorouracil/calcipotriene, and/or imiquimod.  The purpose is to decrease the number of clinically evident and subclinical PreCancerous lesions to prevent progression to development of skin cancer by chemically destroying early precancer changes that may or may not be visible.  It has been shown to reduce the risk of developing skin cancer in the treated area. As a result of treatment, redness, scaling, crusting, and open sores may occur during treatment course. One or more than one of these methods may be used and may have to be used several times to control, suppress and eliminate the PreCancerous changes. Discussed treatment course, expected reaction, and possible side effects. - Recommend daily broad spectrum sunscreen SPF 30+ to sun-exposed areas, reapply every 2 hours as needed.  - Staying in the shade or wearing long sleeves, sun glasses (UVA+UVB protection) and wide brim hats (4-inch brim around the entire circumference of the hat) are also recommended. - Call for new or changing lesions.  -Recommend Red Light PDT with debridement to face  Lentigines, Seborrheic Keratoses, Hemangiomas - Benign normal skin lesions - Benign-appearing - Call for any changes  Melanocytic Nevi - Tan-brown and/or pink-flesh-colored symmetric macules and papules - L post neck 2.58mm brown macule (vs SK) - Benign appearing on exam today, Stable. - Observation - Call clinic for new or changing moles - Recommend daily use of broad spectrum spf 30+ sunscreen to sun-exposed areas.    LENTIGO with ADJACENT VENOUS LAKE L upper ear helix Exam:  tan macule at left upper ear helix, with adjacent purple macule Due to sun exposure  Treatment Plan: Benign-appearing. Observation.  Call clinic for new or  changing moles.  Recommend daily use of broad spectrum spf 30+ sunscreen to sun-exposed areas.   Purpura - Chronic; persistent and recurrent.  Treatable, but not curable. Arms - Violaceous macules and patches - Benign - Related to trauma, age, sun damage and/or use of blood thinners, chronic use of topical and/or oral steroids - Observe - Can use OTC arnica containing moisturizer such as Dermend Bruise Formula if desired - Call for worsening or other concerns   ROSACEA face Exam Mid face erythema with telangiectasias   Chronic and persistent condition with duration or expected duration over one year. Condition is not symptomatic / bothersome to patient.   Rosacea is a chronic progressive skin condition usually affecting the face of adults, causing redness and/or acne bumps. It is treatable but not curable. It sometimes affects the eyes (ocular rosacea) as well. It may respond to topical and/or systemic medication and can flare with stress, sun exposure, alcohol, exercise, topical steroids (including hydrocortisone /cortisone 10) and some foods.  Daily application of broad spectrum spf 30+ sunscreen to face is recommended to reduce flares.   Treatment Plan No daily treatment at this time Pt may use metrogel  prn flares- pt has   Return in about 1 month (around 12/18/2024) for Red light with debridement to face, 50yr UBSE, Hx of AKs.  I, Grayce Saunas, RMA, am acting as scribe for Rexene Rattler, MD .   Documentation: I have reviewed the above documentation for accuracy and completeness, and I agree with the above.  Rexene Rattler, MD

## 2024-11-17 NOTE — Patient Instructions (Addendum)
 Cryotherapy Aftercare  Wash gently with soap and water  everyday.   Apply Vaseline and Band-Aid daily until healed.   Recommend daily broad spectrum sunscreen SPF 30+ to sun-exposed areas, reapply every 2 hours as needed. Call for new or changing lesions.  Staying in the shade or wearing long sleeves, sun glasses (UVA+UVB protection) and wide brim hats (4-inch brim around the entire circumference of the hat) are also recommended for sun protection.   Photodynamic Therapy- Blue or Red Light Therapy  Actinic keratoses are the dry, red scaly spots on the skin caused by sun damage. A portion of these spots can turn into skin cancer with time, and treating them can help prevent development of skin cancer.   Treatment of these spots requires removal of the defective skin cells. There are various ways to remove actinic keratoses, including freezing with liquid nitrogen, treatment with creams, or treatment with a blue light procedure in the office.   Photodynamic Therapy (PDT), also known as blue or red light therapy is an in office procedure used to treat actinic keratoses. It works by targeting precancerous cells. After treatment, these cells peel off and are replaced by healthy ones.   For your phototherapy appointment, you will have two appointments on the day of your treatment. The first appointment will be to apply a cream to the treatment area. You will leave this cream on for 1-2 hours depending on the area being treated. The second appointment will be to shine a blue or red light on the area for 16-20 minutes to kill off the precancer cells. It is common to experience a burning sensation during the treatment.  After your treatment, it will be important to keep the treated areas of skin out of the sun completely for 48-72 hours (2-3 days) to prevent having a reaction.   Common side effects include: - Burning or stinging, which may be severe and can last up to 24-72 hours after your  treatment - Scaling and crusting which may last up to 2 weeks - Redness, swelling and/or peeling which can last up to 4 weeks  To Care for Your Skin After PDT/Blue/Red Light Therapy: - Wash with soap, water  and shampoo as normal. - If needed, you can use cold compresses (e.g. ice packs) for comfort - If okay with your primary care doctor, you may use analgesics such as acetaminophen (tylenol) every 4-6 hours, not to exceed recommended dose - You may apply Cerave Healing Ointment, Vaseline or Aquaphor as needed - If you have a lot of swelling you may take a Benadryl to help with this (this may cause drowsiness), not to exceed recommended dose. This may increase the risk of falls in people over 65 and may slow reaction time while driving, so it is not recommended to take before driving or operating machinery. - Sun Precautions - Wear a wide brim hat for the next week if outside  - Wear a sunblock with zinc or titanium dioxide at least SPF 50 daily  If you have any questions or concerns, please call the office and ask to speak with a nurse.   -------------------------------------------------------------------------------------------------------------- Doxycycline  should be taken with food to prevent nausea. Do not lay down for 30 minutes after taking. Be cautious with sun exposure and use good sun protection while on this medication. Pregnant women should not take this medication.     Due to recent changes in healthcare laws, you may see results of your pathology and/or laboratory studies on MyChart before the  doctors have had a chance to review them. We understand that in some cases there may be results that are confusing or concerning to you. Please understand that not all results are received at the same time and often the doctors may need to interpret multiple results in order to provide you with the best plan of care or course of treatment. Therefore, we ask that you please give us  2 business  days to thoroughly review all your results before contacting the office for clarification. Should we see a critical lab result, you will be contacted sooner.   If You Need Anything After Your Visit  If you have any questions or concerns for your doctor, please call our main line at [phone] and press option 4 to reach your doctor's medical assistant. If no one answers, please leave a voicemail as directed and we will return your call as soon as possible. Messages left after 4 pm will be answered the following business day.   You may also send us  a message via MyChart. We typically respond to MyChart messages within 1-2 business days.  For prescription refills, please ask your pharmacy to contact our office. Our fax number is [phone].  If you have an urgent issue when the clinic is closed that cannot wait until the next business day, you can page your doctor at the number below.    Please note that while we do our best to be available for urgent issues outside of office hours, we are not available 24/7.   If you have an urgent issue and are unable to reach us , you may choose to seek medical care at your doctor's office, retail clinic, urgent care center, or emergency room.  If you have a medical emergency, please immediately call 911 or go to the emergency department.  Pager Numbers  - Dr. Hester: [phone]  - Dr. Jackquline: [phone]  - Dr. Claudene: [phone]   - Dr. Raymund: [phone]  In the event of inclement weather, please call our main line at [phone] for an update on the status of any delays or closures.  Dermatology Medication Tips: Please keep the boxes that topical medications come in in order to help keep track of the instructions about where and how to use these. Pharmacies typically print the medication instructions only on the boxes and not directly on the medication tubes.   If your medication is too expensive, please contact our office at  [phone] option 4 or send us  a message through MyChart.   We are unable to tell what your co-pay for medications will be in advance as this is different depending on your insurance coverage. However, we may be able to find a substitute medication at lower cost or fill out paperwork to get insurance to cover a needed medication.   If a prior authorization is required to get your medication covered by your insurance company, please allow us  1-2 business days to complete this process.  Drug prices often vary depending on where the prescription is filled and some pharmacies may offer cheaper prices.  The website www.goodrx.com contains coupons for medications through different pharmacies. The prices here do not account for what the cost may be with help from insurance (it may be cheaper with your insurance), but the website can give you the price if you did not use any insurance.  - You can print the associated coupon and take it with your prescription to the pharmacy.  - You may also stop by our office  during regular business hours and pick up a GoodRx coupon card.  - If you need your prescription sent electronically to a different pharmacy, notify our office through [facility] or by phone at [phone] option 4.     Si Usted Necesita Algo Despus de Su Visita  Tambin puede enviarnos un mensaje a travs de Clinical Cytogeneticist. Por lo general respondemos a los mensajes de MyChart en el transcurso de 1 a 2 das hbiles.  Para renovar recetas, por favor pida a su farmacia que se ponga en contacto con nuestra oficina. Randi lakes de fax es [place] [phone].  Si tiene un asunto urgente cuando la clnica est cerrada y que no puede esperar hasta el siguiente da hbil, puede llamar/localizar a su doctor(a) al nmero que aparece a continuacin.   Por favor, tenga en cuenta que aunque hacemos todo lo posible para estar disponibles para asuntos urgentes fuera del horario de [place], no estamos  disponibles las 24 horas del da, los 7 [address]  [phone] de la [place].   Si tiene un problema urgente y no puede comunicarse con nosotros, puede optar por buscar atencin mdica  en el consultorio de su doctor(a), en una clnica privada, en un centro de atencin urgente o en una sala de emergencias.  Si tiene engineer, drilling, por favor llame inmediatamente al 911 o vaya a la sala de emergencias.  Nmeros de bper  - Dr. Hester: [phone]  - Dra. Jackquline: [phone]  - Dr. Claudene: [phone]  - Dra. Kitts: [phone]  En caso de inclemencias del [place], por favor llame a nuestra lnea principal al [phone] para una actualizacin sobre el estado de cualquier retraso o cierre.  Consejos para la medicacin en dermatologa: Por favor, guarde las cajas en las que vienen los medicamentos de uso tpico para ayudarle a seguir las instrucciones sobre dnde y cmo usarlos. Las farmacias generalmente imprimen las instrucciones del medicamento slo en las cajas y no directamente en los tubos del [place].   Si su medicamento es muy caro, por favor, pngase en contacto con landry rieger llamando al [phone] y presione la opcin 4 o envenos un mensaje a travs de Clinical Cytogeneticist.   No podemos decirle cul ser su copago por los medicamentos por adelantado ya que esto es diferente dependiendo de la cobertura de su seguro. Sin embargo, es posible que podamos encontrar un medicamento sustituto a audiological scientist un formulario para que el seguro cubra el medicamento que se considera necesario.   Si se requiere una autorizacin previa para que su compaa de seguros cubra su medicamento, por favor permtanos de 1 a 2 das hbiles para completar este proceso.  Los precios de los medicamentos varan con frecuencia dependiendo del environmental consultant de dnde se surte la receta y alguna farmacias pueden ofrecer precios ms baratos.  El sitio web www.goodrx.com tiene cupones para medicamentos de engineer, civil (consulting). Los precios aqu no tienen en cuenta lo que podra costar con la ayuda del seguro (puede ser ms barato con su seguro), pero el sitio web puede darle el precio si no utiliz tourist information centre manager.  - Puede imprimir el cupn correspondiente y llevarlo con su receta a la farmacia.  - Tambin puede pasar por nuestra oficina durante el horario de atencin regular y education officer, museum una tarjeta de cupones de GoodRx.  - Si necesita que su receta se enve electrnicamente a psychiatrist, informe a nuestra oficina a travs de MyChart de [place] o por telfono llamando al [phone] y presione la  opcin 4.

## 2024-12-08 ENCOUNTER — Ambulatory Visit

## 2024-12-20 ENCOUNTER — Other Ambulatory Visit: Payer: Self-pay | Admitting: Family Medicine

## 2024-12-22 ENCOUNTER — Telehealth: Payer: Self-pay | Admitting: Family Medicine

## 2024-12-22 MED ORDER — COLCHICINE 0.6 MG PO TABS: 0.6000 mg | ORAL_TABLET | Freq: Every day | ORAL | 1 refills | Status: AC

## 2024-12-22 NOTE — Telephone Encounter (Signed)
 Copied from CRM [id number]. Topic: Clinical - Prescription Issue >> Dec 22, 2024  8:34 AM Laymon HERO wrote: Reason for CRM: [facility] called patient over the weekend- approved wrong medication type-Colchicine  0.6 MG CAPS - they do not cover the capsule only the tablet

## 2024-12-22 NOTE — Telephone Encounter (Signed)
 New prescription sent for tablets.

## 2024-12-24 ENCOUNTER — Encounter

## 2024-12-24 ENCOUNTER — Ambulatory Visit

## 2025-01-01 ENCOUNTER — Ambulatory Visit: Admitting: Podiatry

## 2025-01-01 DIAGNOSIS — M79675 Pain in left toe(s): Secondary | ICD-10-CM | POA: Diagnosis not present

## 2025-01-01 DIAGNOSIS — B351 Tinea unguium: Secondary | ICD-10-CM

## 2025-01-01 DIAGNOSIS — M79674 Pain in right toe(s): Secondary | ICD-10-CM | POA: Diagnosis not present

## 2025-01-01 NOTE — Progress Notes (Signed)
"  °  Subjective:  Patient ID: Michael Hicks, male    DOB: 08/05/1946,  MRN: [id number]  Chief Complaint  Patient presents with   Nail Problem    Nail trim    79 y.o. male returns for the above complaint.  Patient presents with thickened elongated dystrophic toenails x10.  Painful to touch.  Patient would like for me to debride down.  He is not diabetic.  He denies any other acute complaints.  Does not have any secondary complaints today  Objective:   There were no vitals filed for this visit.  Podiatric Exam: Vascular: dorsalis pedis and posterior tibial pulses are palpable bilateral. Capillary return is immediate. Temperature gradient is WNL. Skin turgor WNL  Sensorium: Normal Semmes Weinstein monofilament test. Normal tactile sensation bilaterally. Nail Exam: Pt has thick disfigured discolored nails with subungual debris noted bilateral entire nail hallux through fifth toenails.  Pain on palpation to the nails. Ulcer Exam: There is no evidence of ulcer or pre-ulcerative changes or infection. Orthopedic Exam: Muscle tone and strength are WNL. No limitations in general ROM. No crepitus or effusions noted. HAV  B/L.  Hammer toes 2-5  B/L. Skin: No Porokeratosis. No infection or ulcers.  No further dry skin noted    Assessment & Plan:   No diagnosis found.          Patient was evaluated and treated and all questions answered.  Xerosis skin -Clinically resolving with over-the-counter medications  Onychomycosis with pain  -Nails palliatively debrided as below. -Educated on self-care  Procedure: Nail Debridement Rationale: pain  Type of Debridement: manual, sharp debridement. Instrumentation: Nail nipper, rotary burr. Number of Nails: 10  Procedures and Treatment: Consent by patient was obtained for treatment procedures. The patient understood the discussion of treatment and procedures well. All questions were answered thoroughly reviewed. Debridement of mycotic and  hypertrophic toenails, 1 through 5 bilateral and clearing of subungual debris. No ulceration, no infection noted.  Return Visit-Office Procedure: Patient instructed to return to the office for a follow up visit 3 months for continued evaluation and treatment.  Michael Hicks, DPM    No follow-ups on file. "

## 2025-01-12 ENCOUNTER — Ambulatory Visit

## 2025-01-12 ENCOUNTER — Encounter

## 2025-04-02 ENCOUNTER — Ambulatory Visit: Admitting: Podiatry

## 2025-05-13 ENCOUNTER — Encounter: Admitting: Family Medicine

## 2025-05-19 ENCOUNTER — Ambulatory Visit

## 2025-11-17 ENCOUNTER — Ambulatory Visit: Admitting: Dermatology
